# Patient Record
Sex: Male | Born: 1975 | Race: White | Hispanic: No | Marital: Married | Smoking: Former smoker
Health system: Southern US, Community
[De-identification: ages and names within clinical notes are randomized; demographics above are authoritative.]

## PROBLEM LIST (undated history)

## (undated) ENCOUNTER — Ambulatory Visit: Admission: EM | Source: Home / Self Care

## (undated) DIAGNOSIS — J45909 Unspecified asthma, uncomplicated: Secondary | ICD-10-CM

## (undated) DIAGNOSIS — K219 Gastro-esophageal reflux disease without esophagitis: Secondary | ICD-10-CM

## (undated) DIAGNOSIS — M1990 Unspecified osteoarthritis, unspecified site: Secondary | ICD-10-CM

## (undated) DIAGNOSIS — F32A Depression, unspecified: Secondary | ICD-10-CM

## (undated) DIAGNOSIS — T7840XA Allergy, unspecified, initial encounter: Secondary | ICD-10-CM

## (undated) DIAGNOSIS — F419 Anxiety disorder, unspecified: Secondary | ICD-10-CM

## (undated) HISTORY — DX: Gastro-esophageal reflux disease without esophagitis: K21.9

## (undated) HISTORY — DX: Anxiety disorder, unspecified: F41.9

## (undated) HISTORY — DX: Depression, unspecified: F32.A

## (undated) HISTORY — DX: Allergy, unspecified, initial encounter: T78.40XA

## (undated) HISTORY — DX: Unspecified asthma, uncomplicated: J45.909

## (undated) HISTORY — DX: Unspecified osteoarthritis, unspecified site: M19.90

---

## 2014-03-27 DIAGNOSIS — G4489 Other headache syndrome: Secondary | ICD-10-CM | POA: Insufficient documentation

## 2014-11-05 ENCOUNTER — Telehealth: Payer: Self-pay | Admitting: Family Medicine

## 2014-11-05 MED ORDER — FLUTICASONE-SALMETEROL 100-50 MCG/DOSE IN AEPB: 1.0000 | INHALATION_SPRAY | Freq: Two times a day (BID) | RESPIRATORY_TRACT | Status: DC

## 2014-11-05 NOTE — Telephone Encounter (Signed)
E-fax came through for refill on: Rx: Advair  Copy in basket.

## 2014-11-05 NOTE — Telephone Encounter (Signed)
Forwarded to provider.

## 2014-11-05 NOTE — Telephone Encounter (Signed)
Rx sent in

## 2014-11-16 ENCOUNTER — Ambulatory Visit (INDEPENDENT_AMBULATORY_CARE_PROVIDER_SITE_OTHER): Payer: 59 | Admitting: Family Medicine

## 2014-11-16 ENCOUNTER — Encounter: Payer: Self-pay | Admitting: Family Medicine

## 2014-11-16 VITALS — BP 124/84 | HR 58 | Temp 98.4°F | Ht 72.5 in | Wt 225.6 lb

## 2014-11-16 DIAGNOSIS — Z13220 Encounter for screening for lipoid disorders: Secondary | ICD-10-CM

## 2014-11-16 DIAGNOSIS — F419 Anxiety disorder, unspecified: Secondary | ICD-10-CM | POA: Insufficient documentation

## 2014-11-16 DIAGNOSIS — J4540 Moderate persistent asthma, uncomplicated: Secondary | ICD-10-CM

## 2014-11-16 DIAGNOSIS — Z0000 Encounter for general adult medical examination without abnormal findings: Secondary | ICD-10-CM | POA: Diagnosis not present

## 2014-11-16 DIAGNOSIS — M659 Synovitis and tenosynovitis, unspecified: Secondary | ICD-10-CM | POA: Diagnosis not present

## 2014-11-16 DIAGNOSIS — M778 Other enthesopathies, not elsewhere classified: Secondary | ICD-10-CM

## 2014-11-16 DIAGNOSIS — F411 Generalized anxiety disorder: Secondary | ICD-10-CM | POA: Diagnosis not present

## 2014-11-16 DIAGNOSIS — J45909 Unspecified asthma, uncomplicated: Secondary | ICD-10-CM | POA: Insufficient documentation

## 2014-11-16 LAB — CBC WITH DIFFERENTIAL/PLATELET
Hematocrit: 43.6 % (ref 37.5–51.0)
Hemoglobin: 14.6 g/dL (ref 12.6–17.7)
Lymphocytes Absolute: 2.3 10*3/uL (ref 0.7–3.1)
Lymphs: 36 %
MCH: 32.1 pg (ref 26.6–33.0)
MCHC: 33.5 g/dL (ref 31.5–35.7)
MCV: 96 fL (ref 79–97)
MID (Absolute): 0.8 10*3/uL (ref 0.1–1.6)
MID: 13 %
Neutrophils Absolute: 3.1 10*3/uL (ref 1.4–7.0)
Neutrophils: 51 %
Platelets: 223 10*3/uL (ref 150–379)
RBC: 4.55 x10E6/uL (ref 4.14–5.80)
RDW: 13.7 % (ref 12.3–15.4)
WBC: 6.2 10*3/uL (ref 3.4–10.8)

## 2014-11-16 LAB — MICROSCOPIC EXAMINATION
Epithelial Cells (non renal): NONE SEEN /hpf (ref 0–10)
RBC, UA: NONE SEEN /hpf (ref 0–?)

## 2014-11-16 MED ORDER — NORTRIPTYLINE HCL 10 MG PO CAPS: 10.0000 mg | ORAL_CAPSULE | Freq: Every day | ORAL | Status: DC

## 2014-11-16 MED ORDER — FLUOXETINE HCL 20 MG PO CAPS: 20.0000 mg | ORAL_CAPSULE | Freq: Every day | ORAL | Status: DC

## 2014-11-16 MED ORDER — BUSPIRONE HCL 5 MG PO TABS: 5.0000 mg | ORAL_TABLET | Freq: Every day | ORAL | Status: DC | PRN

## 2014-11-16 MED ORDER — MELOXICAM 15 MG PO TABS: 15.0000 mg | ORAL_TABLET | Freq: Every day | ORAL | Status: DC

## 2014-11-16 NOTE — Assessment & Plan Note (Signed)
Not under perfect control. May need to increase advair dose. Continue to monitor. Continue current regimen for this time. Follow up in 6 months if not sooner.

## 2014-11-16 NOTE — Progress Notes (Signed)
BP 124/84 mmHg  Pulse 58  Temp(Src) 98.4 F (36.9 C)  Ht 6' 0.5" (1.842 m)  Wt 225 lb 9.6 oz (102.331 kg)  BMI 30.16 kg/m2  SpO2 99%   Subjective:    Patient ID: Christopher Davenport, male    DOB: Nov 23, 1975, 39 y.o.   MRN: [id number]  HPI: Christopher Davenport is a 39 y.o. male presenting on 11/16/2014 for comprehensive medical examination. Current medical complaints include: Asthma and Anxiety  ASTHMA Asthma status: stable Satisfied with current treatment?: yes Albuterol/rescue inhaler frequency: with exercise Dyspnea frequency: with exercise Wheezing frequency: with exercise Cough frequency: with exercise Nocturnal symptom frequency: none Limitation of activity: no Current upper respiratory symptoms: no Last Spirometry: today Failed/intolerant to following asthma meds: qvar Aerochamber/spacer use: no Visits to ER or Urgent Care in past year: no Pneumovax: Not up to Date Influenza: Not up to Date  ANXIETY/STRESS Duration:stable Anxious mood: yes  Excessive worrying: no Irritability: no  Sweating: no Nausea: no Palpitations:no Hyperventilation: no Panic attacks: no Agoraphobia: no  Obscessions/compulsions: no Depressed mood: yes Depression screen PHQ 2/9 11/16/2014  Decreased Interest 0  Down, Depressed, Hopeless 0  PHQ - 2 Score 0  Altered sleeping 2  Tired, decreased energy 1  Change in appetite 1  Feeling bad or failure about yourself  0  Trouble concentrating 1  Moving slowly or fidgety/restless 0  Suicidal thoughts 0  PHQ-9 Score 5  Difficult doing work/chores Somewhat difficult   Anhedonia: no Weight changes: no Insomnia: yes   Hypersomnia: no Fatigue/loss of energy: no Feelings of worthlessness: no Feelings of guilt: no Impaired concentration/indecisiveness: no Suicidal ideations: no  Crying spells: no Recent Stressors/Life Changes: no  He currently lives with: wife- moving to new house Interim Problems from his last visit:  yes  Depression Screen done today and results listed below:  Depression screen [facility] 2/9 11/16/2014  Decreased Interest 0  Down, Depressed, Hopeless 0  PHQ - 2 Score 0  Altered sleeping 2  Tired, decreased energy 1  Change in appetite 1  Feeling bad or failure about yourself  0  Trouble concentrating 1  Moving slowly or fidgety/restless 0  Suicidal thoughts 0  PHQ-9 Score 5  Difficult doing work/chores Somewhat difficult    GAD7: 6  Past Medical History:  Past Medical History  Diagnosis Date  . Asthma   . Anxiety     Surgical History:  History reviewed. No pertinent past surgical history.  Medications:  Current Outpatient Prescriptions on File Prior to Visit  Medication Sig  . Fluticasone-Salmeterol (ADVAIR) 100-50 MCG/DOSE AEPB Inhale 1 puff into the lungs 2 (two) times daily.  Marland Kitchen albuterol (PROVENTIL HFA;VENTOLIN HFA) 108 (90 BASE) MCG/ACT inhaler Inhale into the lungs every 4 (four) hours as needed for wheezing or shortness of breath.   No current facility-administered medications on file prior to visit.    Allergies:  No Known Allergies  Social History:  Social History   Social History  . Marital Status: Married    Spouse Name: N/A  . Number of Children: N/A  . Years of Education: N/A   Occupational History  . Not on file.   Social History Main Topics  . Smoking status: Former Smoker    Quit date: 03/23/2012  . Smokeless tobacco: Never Used  . Alcohol Use: Yes     Comment: 2 during the week  and 3-4 on weekends  . Drug Use: No  . Sexual Activity: Yes   Other Topics Concern  .  Not on file   Social History Narrative   History  Smoking status  . Former Smoker  . Quit date: 03/23/2012  Smokeless tobacco  . Never Used   History  Alcohol Use  . Yes    Comment: 2 during the week  and 3-4 on weekends    Family History:  Family History  Problem Relation Age of Onset  . Diabetes Mother   . Kidney disease Mother   . Heart disease Mother   .  Hypertension Mother   . Hyperlipidemia Mother   . Dementia Maternal Grandmother   . Diabetes Maternal Grandfather   . Diabetes Paternal Grandmother     Past medical history, surgical history, medications, allergies, family history and social history reviewed with patient today and changes made to appropriate areas of the chart.   Review of Systems  Constitutional: Negative.   HENT: Negative.   Eyes: Negative.   Respiratory: Positive for shortness of breath and wheezing. Negative for cough, hemoptysis and sputum production.   Cardiovascular: Negative.   Gastrointestinal: Negative.   Genitourinary: Positive for frequency. Negative for dysuria, urgency, hematuria and flank pain.       No hesitancy, + incompletely emptying, no straining  Musculoskeletal: Negative for myalgias, back pain, joint pain, falls and neck pain.       Tendonitis in L elbow  Skin: Negative.   Neurological: Negative.   Endo/Heme/Allergies: Negative.   Psychiatric/Behavioral: Negative.     All other ROS negative except what is listed above and in the HPI.      Objective:    BP 124/84 mmHg  Pulse 58  Temp(Src) 98.4 F (36.9 C)  Ht 6' 0.5" (1.842 m)  Wt 225 lb 9.6 oz (102.331 kg)  BMI 30.16 kg/m2  SpO2 99%  Wt Readings from Last 3 Encounters:  11/16/14 225 lb 9.6 oz (102.331 kg)  06/12/14 233 lb (105.688 kg)    Physical Exam  Constitutional: He is oriented to person, place, and time. He appears well-developed and well-nourished. No distress.  HENT:  Head: Normocephalic and atraumatic.  Right Ear: Hearing and external ear normal.  Left Ear: Hearing and external ear normal.  Nose: Nose normal.  Mouth/Throat: Oropharynx is clear and moist. No oropharyngeal exudate.  Eyes: Conjunctivae, EOM and lids are normal. Pupils are equal, round, and reactive to light. Right eye exhibits no discharge. Left eye exhibits no discharge. No scleral icterus.  Neck: Normal range of motion. Neck supple. No JVD present. No  tracheal deviation present. No thyromegaly present.  Cardiovascular: Normal rate, regular rhythm, normal heart sounds and intact distal pulses.  Exam reveals no gallop and no friction rub.   No murmur heard. Pulmonary/Chest: Effort normal and breath sounds normal. No stridor. No respiratory distress. He has no wheezes. He has no rales. He exhibits no tenderness.  Abdominal: Soft. Bowel sounds are normal. He exhibits no distension and no mass. There is no tenderness. There is no rebound and no guarding. Hernia confirmed negative in the right inguinal area and confirmed negative in the left inguinal area.  Genitourinary: Testes normal and penis normal. Circumcised. No penile tenderness.  High riding R testicle  Musculoskeletal: Normal range of motion. He exhibits tenderness. He exhibits no edema.  L lateral and medial epicondyle  Lymphadenopathy:    He has no cervical adenopathy.       Right: No inguinal adenopathy present.       Left: No inguinal adenopathy present.  Neurological: He is alert and oriented to  person, place, and time. He has normal reflexes. He displays normal reflexes. No cranial nerve deficit. He exhibits normal muscle tone. Coordination normal.  Skin: Skin is warm, dry and intact. No rash noted. No erythema. No pallor.  Psychiatric: He has a normal mood and affect. His speech is normal and behavior is normal. Judgment and thought content normal. Cognition and memory are normal.  Nursing note and vitals reviewed.   No results found for this or any previous visit.    Assessment & Plan:   Problem List Items Addressed This Visit      Respiratory   Asthma    Not under perfect control. May need to increase advair dose. Continue to monitor. Continue current regimen for this time. Follow up in 6 months if not sooner.       Relevant Orders   Spirometry with graph (Completed)   CBC With Differential/Platelet     Other   Anxiety disorder    Under fair control on current  regimen. Continue current regimen. Refills given today. Follow up in 6 months. Continue to monitor.       Relevant Orders   Comprehensive metabolic panel   TSH    Other Visit Diagnoses    Routine general medical examination at a health care facility    -  Primary    Refused vaccines. Checking labs today. Continue to work on diet and exercise. Follow up for PE in 1 year.     Relevant Orders    Comprehensive metabolic panel    TSH    UA/M w/rflx Culture, Routine    CBC With Differential/Platelet    Screening cholesterol level        Checking today.    Relevant Orders    Lipid Panel w/o Chol/HDL Ratio    Tendonitis of elbow, left        Will treat with meloxicam. Rest and heat as able. Call if not getting better.         LABORATORY TESTING:  Health maintenance labs ordered today as discussed above.   IMMUNIZATIONS:   - Tdap: Tetanus vaccination status reviewed: Patient refused at this time. - Influenza: Postponed to flu season - Pneumovax: Refused  SCREENING: -Spirometry: Up to date   PATIENT COUNSELING:    Sexuality: Discussed sexually transmitted diseases, partner selection, use of condoms, avoidance of unintended pregnancy  and contraceptive alternatives.   Advised to avoid cigarette smoking.  I discussed with the patient that most people either abstain from alcohol or drink within safe limits (<=14/week and <=4 drinks/occasion for males, <=7/weeks and <= 3 drinks/occasion for females) and that the risk for alcohol disorders and other health effects rises proportionally with the number of drinks per week and how often a drinker exceeds daily limits.  Discussed cessation/primary prevention of drug use and availability of treatment for abuse.   Diet: Encouraged to adjust caloric intake to maintain  or achieve ideal body weight, to reduce intake of dietary saturated fat and total fat, to limit sodium intake by avoiding high sodium foods and not adding table salt, and to  maintain adequate dietary potassium and calcium preferably from fresh fruits, vegetables, and low-fat dairy products.    stressed the importance of regular exercise  Injury prevention: Discussed safety belts, safety helmets, smoke detector, smoking near bedding or upholstery.   Dental health: Discussed importance of regular tooth brushing, flossing, and dental visits.   Follow up plan: NEXT PREVENTATIVE PHYSICAL DUE IN 1 YEAR. Return in about 6  months (around 05/19/2015) for Follow up anxiety and asthma.

## 2014-11-16 NOTE — Patient Instructions (Signed)
Asthma Attack Prevention Although there is no way to prevent asthma from starting, you can take steps to control the disease and reduce its symptoms. Learn about your asthma and how to control it. Take an active role to control your asthma by working with your health care provider to create and follow an asthma action plan. An asthma action plan guides you in:  Taking your medicines properly.  Avoiding things that set off your asthma or make your asthma worse (asthma triggers).  Tracking your level of asthma control.  Responding to worsening asthma.  Seeking emergency care when needed. To track your asthma, keep records of your symptoms, check your peak flow number using a handheld device that shows how well air moves out of your lungs (peak flow meter), and get regular asthma checkups.  WHAT ARE SOME WAYS TO PREVENT AN ASTHMA ATTACK?  Take medicines as directed by your health care provider.  Keep track of your asthma symptoms and level of control.  With your health care provider, write a detailed plan for taking medicines and managing an asthma attack. Then be sure to follow your action plan. Asthma is an ongoing condition that needs regular monitoring and treatment.  Identify and avoid asthma triggers. Many outdoor allergens and irritants (such as pollen, mold, cold air, and air pollution) can trigger asthma attacks. Find out what your asthma triggers are and take steps to avoid them.  Monitor your breathing. Learn to recognize warning signs of an attack, such as coughing, wheezing, or shortness of breath. Your lung function may decrease before you notice any signs or symptoms, so regularly measure and record your peak airflow with a home peak flow meter.  Identify and treat attacks early. If you act quickly, you are less likely to have a severe attack. You will also need less medicine to control your symptoms. When your peak flow measurements decrease and alert you to an upcoming attack,  take your medicine as instructed and immediately stop any activity that may have triggered the attack. If your symptoms do not improve, get medical help.  Pay attention to increasing quick-relief inhaler use. If you find yourself relying on your quick-relief inhaler, your asthma is not under control. See your health care provider about adjusting your treatment. WHAT CAN MAKE MY SYMPTOMS WORSE? A number of common things can set off or make your asthma symptoms worse and cause temporary increased inflammation of your airways. Keep track of your asthma symptoms for several weeks, detailing all the environmental and emotional factors that are linked with your asthma. When you have an asthma attack, go back to your asthma diary to see which factor, or combination of factors, might have contributed to it. Once you know what these factors are, you can take steps to control many of them. If you have allergies and asthma, it is important to take asthma prevention steps at home. Minimizing contact with the substance to which you are allergic will help prevent an asthma attack. Some triggers and ways to avoid these triggers are: Animal Dander:  Some people are allergic to the flakes of skin or dried saliva from animals with fur or feathers.   There is no such thing as a hypoallergenic dog or cat breed. All dogs or cats can cause allergies, even if they don't shed.  Keep these pets out of your home.  If you are not able to keep a pet outdoors, keep the pet out of your bedroom and other sleeping areas at all   times, and keep the door closed.  Remove carpets and furniture covered with cloth from your home. If that is not possible, keep the pet away from fabric-covered furniture and carpets. Dust Mites: Many people with asthma are allergic to dust mites. Dust mites are tiny bugs that are found in every home in mattresses, pillows, carpets, fabric-covered furniture, bedcovers, clothes, stuffed toys, and other  fabric-covered items.   Cover your mattress in a special dust-proof cover.  Cover your pillow in a special dust-proof cover, or wash the pillow each week in hot water. Water must be hotter than 130 F (54.4 C) to kill dust mites. Cold or warm water used with detergent and bleach can also be effective.  Wash the sheets and blankets on your bed each week in hot water.  Try not to sleep or lie on cloth-covered cushions.  Call ahead when traveling and ask for a smoke-free hotel room. Bring your own bedding and pillows in case the hotel only supplies feather pillows and down comforters, which may contain dust mites and cause asthma symptoms.  Remove carpets from your bedroom and those laid on concrete, if you can.  Keep stuffed toys out of the bed, or wash the toys weekly in hot water or cooler water with detergent and bleach. Cockroaches: Many people with asthma are allergic to the droppings and remains of cockroaches.   Keep food and garbage in closed containers. Never leave food out.  Use poison baits, traps, powders, gels, or paste (for example, boric acid).  If a spray is used to kill cockroaches, stay out of the room until the odor goes away. Indoor Mold:  Fix leaky faucets, pipes, or other sources of water that have mold around them.  Clean floors and moldy surfaces with a fungicide or diluted bleach.  Avoid using humidifiers, vaporizers, or swamp coolers. These can spread molds through the air. Pollen and Outdoor Mold:  When pollen or mold spore counts are high, try to keep your windows closed.  Stay indoors with windows closed from late morning to afternoon. Pollen and some mold spore counts are highest at that time.  Ask your health care provider whether you need to take anti-inflammatory medicine or increase your dose of the medicine before your allergy season starts. Other Irritants to Avoid:  Tobacco smoke is an irritant. If you smoke, ask your health care provider how  you can quit. Ask family members to quit smoking, too. Do not allow smoking in your home or car.  If possible, do not use a wood-burning stove, kerosene heater, or fireplace. Minimize exposure to all sources of smoke, including incense, candles, fires, and fireworks.  Try to stay away from strong odors and sprays, such as perfume, talcum powder, hair spray, and paints.  Decrease humidity in your home and use an indoor air cleaning device. Reduce indoor humidity to below 60%. Dehumidifiers or central air conditioners can do this.  Decrease house dust exposure by changing furnace and air cooler filters frequently.  Try to have someone else vacuum for you once or twice a week. Stay out of rooms while they are being vacuumed and for a short while afterward.  If you vacuum, use a dust mask from a hardware store, a double-layered or microfilter vacuum cleaner bag, or a vacuum cleaner with a HEPA filter.  Sulfites in foods and beverages can be irritants. Do not drink beer or wine or eat dried fruit, processed potatoes, or shrimp if they cause asthma symptoms.  Cold   air can trigger an asthma attack. Cover your nose and mouth with a scarf on cold or windy days.  Several health conditions can make asthma more difficult to manage, including a runny nose, sinus infections, reflux disease, psychological stress, and sleep apnea. Work with your health care provider to manage these conditions.  Avoid close contact with people who have a respiratory infection such as a cold or the flu, since your asthma symptoms may get worse if you catch the infection. Wash your hands thoroughly after touching items that may have been handled by people with a respiratory infection.  Get a flu shot every year to protect against the flu virus, which often makes asthma worse for days or weeks. Also get a pneumonia shot if you have not previously had one. Unlike the flu shot, the pneumonia shot does not need to be given  yearly. Medicines:  Talk to your health care provider about whether it is safe for you to take aspirin or non-steroidal anti-inflammatory medicines (NSAIDs). In a small number of people with asthma, aspirin and NSAIDs can cause asthma attacks. These medicines must be avoided by people who have known aspirin-sensitive asthma. It is important that people with aspirin-sensitive asthma read labels of all over-the-counter medicines used to treat pain, colds, coughs, and fever.  Beta-blockers and ACE inhibitors are other medicines you should discuss with your health care provider. HOW CAN I FIND OUT WHAT I AM ALLERGIC TO? Ask your asthma health care provider about allergy skin testing or blood testing (the RAST test) to identify the allergens to which you are sensitive. If you are found to have allergies, the most important thing to do is to try to avoid exposure to any allergens that you are sensitive to as much as possible. Other treatments for allergies, such as medicines and allergy shots (immunotherapy) are available.  CAN I EXERCISE? Follow your health care provider's advice regarding asthma treatment before exercising. It is important to maintain a regular exercise program, but vigorous exercise or exercise in cold, humid, or dry environments can cause asthma attacks, especially for those people who have exercise-induced asthma. Document Released: 02/25/2009 Document Revised: 03/14/2013 Document Reviewed: 09/14/2012 [facility] Patient Information 2015 [place], [place]. This information is not intended to replace advice given to you by your health care provider. Make sure you discuss any questions you have with your health care provider. Health Maintenance A healthy lifestyle and preventative care can promote health and wellness.  Maintain regular health, dental, and eye exams.  Eat a healthy diet. Foods like vegetables, fruits, whole grains, low-fat dairy products, and lean protein foods contain the  nutrients you need and are low in calories. Decrease your intake of foods high in solid fats, added sugars, and salt. Get information about a proper diet from your health care provider, if necessary.  Regular physical exercise is one of the most important things you can do for your health. Most adults should get at least 150 minutes of moderate-intensity exercise (any activity that increases your heart rate and causes you to sweat) each week. In addition, most adults need muscle-strengthening exercises on 2 or more days a week.   Maintain a healthy weight. The body mass index (BMI) is a screening tool to identify possible weight problems. It provides an estimate of body fat based on height and weight. Your health care provider can find your BMI and can help you achieve or maintain a healthy weight. For males 20 years and older:  A BMI below  18.5 is considered underweight.  A BMI of 18.5 to 24.9 is normal.  A BMI of 25 to 29.9 is considered overweight.  A BMI of 30 and above is considered obese.  Maintain normal blood lipids and cholesterol by exercising and minimizing your intake of saturated fat. Eat a balanced diet with plenty of fruits and vegetables. Blood tests for lipids and cholesterol should begin at age 58 and be repeated every 5 years. If your lipid or cholesterol levels are high, you are over age 59, or you are at high risk for heart disease, you may need your cholesterol levels checked more frequently.Ongoing high lipid and cholesterol levels should be treated with medicines if diet and exercise are not working.  If you smoke, find out from your health care provider how to quit. If you do not use tobacco, do not start.  Lung cancer screening is recommended for adults aged 71-80 years who are at high risk for developing lung cancer because of a history of smoking. A yearly low-dose CT scan of the lungs is recommended for people who have at least a 30-pack-year history of smoking and  are current smokers or have quit within the past 15 years. A pack year of smoking is smoking an average of 1 pack of cigarettes a day for 1 year (for example, a 30-pack-year history of smoking could mean smoking 1 pack a day for 30 years or 2 packs a day for 15 years). Yearly screening should continue until the smoker has stopped smoking for at least 15 years. Yearly screening should be stopped for people who develop a health problem that would prevent them from having lung cancer treatment.  If you choose to drink alcohol, do not have more than 2 drinks per day. One drink is considered to be 12 oz (360 mL) of beer, 5 oz (150 mL) of wine, or 1.5 oz (45 mL) of liquor.  Avoid the use of street drugs. Do not share needles with anyone. Ask for help if you need support or instructions about stopping the use of drugs.  High blood pressure causes heart disease and increases the risk of stroke. Blood pressure should be checked at least every 1-2 years. Ongoing high blood pressure should be treated with medicines if weight loss and exercise are not effective.  If you are 18-67 years old, ask your health care provider if you should take aspirin to prevent heart disease.  Diabetes screening involves taking a blood sample to check your fasting blood sugar level. This should be done once every 3 years after age 65 if you are at a normal weight and without risk factors for diabetes. Testing should be considered at a younger age or be carried out more frequently if you are overweight and have at least 1 risk factor for diabetes.  Colorectal cancer can be detected and often prevented. Most routine colorectal cancer screening begins at the age of 6 and continues through age 40. However, your health care provider may recommend screening at an earlier age if you have risk factors for colon cancer. On a yearly basis, your health care provider may provide home test kits to check for hidden blood in the stool. A small camera  at the end of a tube may be used to directly examine the colon (sigmoidoscopy or colonoscopy) to detect the earliest forms of colorectal cancer. Talk to your health care provider about this at age 50 when routine screening begins. A direct exam of the colon should  be repeated every 5-10 years through age 57, unless early forms of precancerous polyps or small growths are found.  People who are at an increased risk for hepatitis B should be screened for this virus. You are considered at high risk for hepatitis B if:  You were born in a country where hepatitis B occurs often. Talk with your health care provider about which countries are considered high risk.  Your parents were born in a high-risk country and you have not received a shot to protect against hepatitis B (hepatitis B vaccine).  You have HIV or AIDS.  You use needles to inject street drugs.  You live with, or have sex with, someone who has hepatitis B.  You are a man who has sex with other men (MSM).  You get hemodialysis treatment.  You take certain medicines for conditions like cancer, organ transplantation, and autoimmune conditions.  Hepatitis C blood testing is recommended for all people born from [birth year] through 1967 and any individual with known risk factors for hepatitis C.  Healthy men should no longer receive prostate-specific antigen (PSA) blood tests as part of routine cancer screening. Talk to your health care provider about prostate cancer screening.  Testicular cancer screening is not recommended for adolescents or adult males who have no symptoms. Screening includes self-exam, a health care provider exam, and other screening tests. Consult with your health care provider about any symptoms you have or any concerns you have about testicular cancer.  Practice safe sex. Use condoms and avoid high-risk sexual practices to reduce the spread of sexually transmitted infections (STIs).  You should be screened for STIs,  including gonorrhea and chlamydia if:  You are sexually active and are younger than 24 years.  You are older than 24 years, and your health care provider tells you that you are at risk for this type of infection.  Your sexual activity has changed since you were last screened, and you are at an increased risk for chlamydia or gonorrhea. Ask your health care provider if you are at risk.  If you are at risk of being infected with HIV, it is recommended that you take a prescription medicine daily to prevent HIV infection. This is called pre-exposure prophylaxis (PrEP). You are considered at risk if:  You are a man who has sex with other men (MSM).  You are a heterosexual man who is sexually active with multiple partners.  You take drugs by injection.  You are sexually active with a partner who has HIV.  Talk with your health care provider about whether you are at high risk of being infected with HIV. If you choose to begin PrEP, you should first be tested for HIV. You should then be tested every 3 months for as long as you are taking PrEP.  Use sunscreen. Apply sunscreen liberally and repeatedly throughout the day. You should seek shade when your shadow is shorter than you. Protect yourself by wearing long sleeves, pants, a wide-brimmed hat, and sunglasses year round whenever you are outdoors.  Tell your health care provider of new moles or changes in moles, especially if there is a change in shape or color. Also, tell your health care provider if a mole is larger than the size of a pencil eraser.  A one-time screening for abdominal aortic aneurysm (AAA) and surgical repair of large AAAs by ultrasound is recommended for men aged 10-75 years who are current or former smokers.  Stay current with your vaccines (immunizations). Document Released:  09/05/2007 Document Revised: 03/14/2013 Document Reviewed: 08/04/2010 [employer] Patient Information 2015 [place], [place]. This information is not  intended to replace advice given to you by your health care provider. Make sure you discuss any questions you have with your health care provider.

## 2014-11-16 NOTE — Assessment & Plan Note (Signed)
Under fair control on current regimen. Continue current regimen. Refills given today. Follow up in 6 months. Continue to monitor.

## 2014-11-17 LAB — COMPREHENSIVE METABOLIC PANEL
ALT: 26 IU/L (ref 0–44)
AST: 25 IU/L (ref 0–40)
Albumin/Globulin Ratio: 1.8 (ref 1.1–2.5)
Albumin: 4.6 g/dL (ref 3.5–5.5)
Alkaline Phosphatase: 75 IU/L (ref 39–117)
BUN/Creatinine Ratio: 21 — ABNORMAL HIGH (ref 9–20)
BUN: 21 mg/dL (ref 6–24)
Bilirubin Total: 0.4 mg/dL (ref 0.0–1.2)
CO2: 23 mmol/L (ref 18–29)
Calcium: 9.5 mg/dL (ref 8.7–10.2)
Chloride: 101 mmol/L (ref 97–108)
Creatinine, Ser: 1 mg/dL (ref 0.76–1.27)
GFR calc Af Amer: 108 mL/min/{1.73_m2} (ref >59)
GFR calc non Af Amer: 93 mL/min/{1.73_m2} (ref >59)
Globulin, Total: 2.6 g/dL (ref 1.5–4.5)
Glucose: 97 mg/dL (ref 65–99)
Potassium: 4.4 mmol/L (ref 3.5–5.2)
Sodium: 141 mmol/L (ref 134–144)
Total Protein: 7.2 g/dL (ref 6.0–8.5)

## 2014-11-17 LAB — TSH: TSH: 2.08 u[IU]/mL (ref 0.450–4.500)

## 2014-11-17 LAB — LIPID PANEL W/O CHOL/HDL RATIO
Cholesterol, Total: 236 mg/dL — ABNORMAL HIGH (ref 100–199)
HDL: 61 mg/dL (ref >39)
LDL Calculated: 157 mg/dL — ABNORMAL HIGH (ref 0–99)
Triglycerides: 89 mg/dL (ref 0–149)
VLDL Cholesterol Cal: 18 mg/dL (ref 5–40)

## 2014-11-18 LAB — UA/M W/RFLX CULTURE, ROUTINE: Organism ID, Bacteria: NO GROWTH

## 2014-11-19 ENCOUNTER — Encounter: Payer: Self-pay | Admitting: Family Medicine

## 2014-12-24 ENCOUNTER — Other Ambulatory Visit: Payer: Self-pay | Admitting: Family Medicine

## 2015-04-30 ENCOUNTER — Other Ambulatory Visit: Payer: Self-pay | Admitting: Family Medicine

## 2015-05-14 ENCOUNTER — Other Ambulatory Visit: Payer: Self-pay | Admitting: Family Medicine

## 2015-05-17 ENCOUNTER — Ambulatory Visit (INDEPENDENT_AMBULATORY_CARE_PROVIDER_SITE_OTHER): Payer: 59 | Admitting: Family Medicine

## 2015-05-17 ENCOUNTER — Encounter: Payer: Self-pay | Admitting: Family Medicine

## 2015-05-17 VITALS — BP 126/76 | HR 69 | Temp 98.7°F | Ht 72.3 in | Wt 226.0 lb

## 2015-05-17 DIAGNOSIS — Z23 Encounter for immunization: Secondary | ICD-10-CM

## 2015-05-17 DIAGNOSIS — F411 Generalized anxiety disorder: Secondary | ICD-10-CM

## 2015-05-17 DIAGNOSIS — J4540 Moderate persistent asthma, uncomplicated: Secondary | ICD-10-CM | POA: Diagnosis not present

## 2015-05-17 MED ORDER — BUSPIRONE HCL 5 MG PO TABS: 5.0000 mg | ORAL_TABLET | Freq: Every day | ORAL | Status: DC | PRN

## 2015-05-17 MED ORDER — FLUTICASONE-SALMETEROL 100-50 MCG/DOSE IN AEPB: 1.0000 | INHALATION_SPRAY | Freq: Two times a day (BID) | RESPIRATORY_TRACT | Status: DC

## 2015-05-17 MED ORDER — FLUOXETINE HCL 20 MG PO CAPS: 20.0000 mg | ORAL_CAPSULE | Freq: Every day | ORAL | Status: DC

## 2015-05-17 MED ORDER — ALBUTEROL SULFATE HFA 108 (90 BASE) MCG/ACT IN AERS: INHALATION_SPRAY | RESPIRATORY_TRACT | Status: DC

## 2015-05-17 NOTE — Assessment & Plan Note (Signed)
Under fair control. Does not want to change medicine. Continue current regimen. Continue to monitor.

## 2015-05-17 NOTE — Assessment & Plan Note (Signed)
Under good control. Continue current regimen. Continue to monitor. Recheck 6 months. Pneumovax given today.

## 2015-05-17 NOTE — Patient Instructions (Signed)

## 2015-05-17 NOTE — Progress Notes (Signed)
BP 126/76 mmHg  Pulse 69  Temp(Src) 98.7 F (37.1 C)  Ht 6' 0.3" (1.836 m)  Wt 226 lb (102.513 kg)  BMI 30.41 kg/m2  SpO2 98%   Subjective:    Patient ID: Christopher Davenport, male    DOB: 20-Mar-1976, 40 y.o.   MRN: [id number]  HPI: Christopher Davenport is a 40 y.o. male  Chief Complaint  Patient presents with  . Asthma  . Anxiety   ANXIETY/STRESS Duration:exacerbated Anxious mood: yes  Excessive worrying: yes Irritability: yes  Sweating: no Nausea: no Palpitations:no Hyperventilation: yes Panic attacks: no Agoraphobia: no  Obscessions/compulsions: no Depressed mood: no Depression screen [facility] 2/9 05/17/2015 11/16/2014  Decreased Interest 0 0  Down, Depressed, Hopeless 1 0  PHQ - 2 Score 1 0  Altered sleeping - 2  Tired, decreased energy - 1  Change in appetite - 1  Feeling bad or failure about yourself  - 0  Trouble concentrating - 1  Moving slowly or fidgety/restless - 0  Suicidal thoughts - 0  PHQ-9 Score - 5  Difficult doing work/chores - Somewhat difficult   GAD 7 : Generalized Anxiety Score 05/17/2015  Nervous, Anxious, on Edge 3  Control/stop worrying 1  Worry too much - different things 3  Trouble relaxing 2  Restless 1  Easily annoyed or irritable 2  Afraid - awful might happen 0  Total GAD 7 Score 12  Anxiety Difficulty Somewhat difficult    Anhedonia: no Weight changes: no Insomnia: yes hard to fall asleep  Hypersomnia: no Fatigue/loss of energy: yes Feelings of worthlessness: no Feelings of guilt: no Impaired concentration/indecisiveness: no Suicidal ideations: no  Crying spells: no Recent Stressors/Life Changes: yes   Relationship problems: no   Family stress: no     Financial stress: no    Job stress: yes    Recent death/loss: no  ASTHMA Asthma status: stable Satisfied with current treatment?: yes Albuterol/rescue inhaler frequency: with working out Dyspnea frequency: with working out Wheezing frequency: rarely Cough  frequency: no Nocturnal symptom frequency: never  Limitation of activity: no Current upper respiratory symptoms: no Aerochamber/spacer use: no Visits to ER or Urgent Care in past year: no Pneumovax: Given today Influenza: Up to Date  Relevant past medical, surgical, family and social history reviewed and updated as indicated. Interim medical history since our last visit reviewed. Allergies and medications reviewed and updated.  Review of Systems  Constitutional: Negative.   Respiratory: Negative.   Cardiovascular: Negative.   Gastrointestinal: Negative.   Musculoskeletal: Negative.   Psychiatric/Behavioral: Negative.     Per HPI unless specifically indicated above     Objective:    BP 126/76 mmHg  Pulse 69  Temp(Src) 98.7 F (37.1 C)  Ht 6' 0.3" (1.836 m)  Wt 226 lb (102.513 kg)  BMI 30.41 kg/m2  SpO2 98%  Wt Readings from Last 3 Encounters:  05/17/15 226 lb (102.513 kg)  11/16/14 225 lb 9.6 oz (102.331 kg)  06/12/14 233 lb (105.688 kg)    Physical Exam  Constitutional: He is oriented to person, place, and time. He appears well-developed and well-nourished. No distress.  HENT:  Head: Normocephalic and atraumatic.  Right Ear: Hearing and external ear normal.  Left Ear: Hearing and external ear normal.  Nose: Nose normal.  Mouth/Throat: Oropharynx is clear and moist. No oropharyngeal exudate.  Eyes: Conjunctivae, EOM and lids are normal. Pupils are equal, round, and reactive to light. Right eye exhibits no discharge. Left eye exhibits no discharge. No scleral icterus.  Neck: Normal range of motion. Neck supple. No JVD present. No tracheal deviation present. No thyromegaly present.  Cardiovascular: Normal rate, regular rhythm, normal heart sounds and intact distal pulses.  Exam reveals no gallop and no friction rub.   No murmur heard. Pulmonary/Chest: Effort normal and breath sounds normal. No stridor. No respiratory distress. He has no wheezes. He has no rales. He  exhibits no tenderness.  Musculoskeletal: Normal range of motion.  Lymphadenopathy:    He has no cervical adenopathy.  Neurological: He is alert and oriented to person, place, and time.  Skin: Skin is warm, dry and intact. No rash noted. He is not diaphoretic. No erythema. No pallor.  Psychiatric: He has a normal mood and affect. His speech is normal and behavior is normal. Judgment and thought content normal. Cognition and memory are normal.  Nursing note and vitals reviewed.   Results for orders placed or performed in visit on 11/16/14  Microscopic Examination  Result Value Ref Range   WBC, UA 0-5 0 -  5 /hpf   RBC, UA None seen 0 -  2 /hpf   Epithelial Cells (non renal) None seen 0 - 10 /hpf   Bacteria, UA Few None seen/Few  Comprehensive metabolic panel  Result Value Ref Range   Glucose 97 65 - 99 mg/dL   BUN 21 6 - 24 mg/dL   Creatinine, Ser 1.00 0.76 - 1.27 mg/dL   GFR calc non Af Amer 93 >59 mL/min/1.73   GFR calc Af Amer 108 >59 mL/min/1.73   BUN/Creatinine Ratio 21 (H) 9 - 20   Sodium 141 134 - 144 mmol/L   Potassium 4.4 3.5 - 5.2 mmol/L   Chloride 101 97 - 108 mmol/L   CO2 23 18 - 29 mmol/L   Calcium 9.5 8.7 - 10.2 mg/dL   Total Protein 7.2 6.0 - 8.5 g/dL   Albumin 4.6 3.5 - 5.5 g/dL   Globulin, Total 2.6 1.5 - 4.5 g/dL   Albumin/Globulin Ratio 1.8 1.1 - 2.5   Bilirubin Total 0.4 0.0 - 1.2 mg/dL   Alkaline Phosphatase 75 39 - 117 IU/L   AST 25 0 - 40 IU/L   ALT 26 0 - 44 IU/L  TSH  Result Value Ref Range   TSH 2.080 0.450 - 4.500 uIU/mL  UA/M w/rflx Culture, Routine  Result Value Ref Range   Urine Culture, Routine Final report    Urine Culture result 1 No growth   Lipid Panel w/o Chol/HDL Ratio  Result Value Ref Range   Cholesterol, Total 236 (H) 100 - 199 mg/dL   Triglycerides 89 0 - 149 mg/dL   HDL 61 >39 mg/dL   VLDL Cholesterol Cal 18 5 - 40 mg/dL   LDL Calculated 157 (H) 0 - 99 mg/dL  CBC With Differential/Platelet  Result Value Ref Range   WBC 6.2  3.4 - 10.8 x10E3/uL   RBC 4.55 4.14 - 5.80 x10E6/uL   Hemoglobin 14.6 12.6 - 17.7 g/dL   Hematocrit 43.6 37.5 - 51.0 %   MCV 96 79 - 97 fL   MCH 32.1 26.6 - 33.0 pg   MCHC 33.5 31.5 - 35.7 g/dL   RDW 13.7 12.3 - 15.4 %   Platelets 223 150 - 379 x10E3/uL   Neutrophils 51 %   Lymphs 36 %   MID 13 %   Neutrophils Absolute 3.1 1.4 - 7.0 x10E3/uL   Lymphocytes Absolute 2.3 0.7 - 3.1 x10E3/uL   MID (Absolute) 0.8 0.1 - 1.6 X10E3/uL  Assessment & Plan:   Problem List Items Addressed This Visit      Respiratory   Asthma    Under good control. Continue current regimen. Continue to monitor. Recheck 6 months. Pneumovax given today.      Relevant Medications   Fluticasone-Salmeterol (ADVAIR DISKUS) 100-50 MCG/DOSE AEPB   albuterol (PROAIR HFA) 108 (90 Base) MCG/ACT inhaler     Other   Anxiety disorder - Primary    Under fair control. Does not want to change medicine. Continue current regimen. Continue to monitor.        Other Visit Diagnoses    Immunization due        Pneumovax given today.     Relevant Orders    Pneumococcal polysaccharide vaccine 23-valent greater than or equal to 2yo subcutaneous/IM        Follow up plan: Return in about 6 months (around 11/14/2015) for physical.

## 2015-07-22 ENCOUNTER — Other Ambulatory Visit: Payer: Self-pay | Admitting: Unknown Physician Specialty

## 2015-07-22 DIAGNOSIS — R1310 Dysphagia, unspecified: Secondary | ICD-10-CM

## 2015-07-22 DIAGNOSIS — K219 Gastro-esophageal reflux disease without esophagitis: Secondary | ICD-10-CM

## 2015-07-23 ENCOUNTER — Ambulatory Visit
Admission: EM | Admit: 2015-07-23 | Discharge: 2015-07-23 | Disposition: A | Discharge status: Home / Self Care | Payer: 59 | Attending: Family Medicine | Admitting: Family Medicine

## 2015-07-23 DIAGNOSIS — R42 Dizziness and giddiness: Secondary | ICD-10-CM | POA: Diagnosis present

## 2015-07-23 DIAGNOSIS — Z87891 Personal history of nicotine dependence: Secondary | ICD-10-CM | POA: Insufficient documentation

## 2015-07-23 DIAGNOSIS — F419 Anxiety disorder, unspecified: Secondary | ICD-10-CM | POA: Insufficient documentation

## 2015-07-23 DIAGNOSIS — R209 Unspecified disturbances of skin sensation: Secondary | ICD-10-CM | POA: Insufficient documentation

## 2015-07-23 DIAGNOSIS — J45909 Unspecified asthma, uncomplicated: Secondary | ICD-10-CM | POA: Diagnosis not present

## 2015-07-23 DIAGNOSIS — R202 Paresthesia of skin: Secondary | ICD-10-CM

## 2015-07-23 LAB — COMPREHENSIVE METABOLIC PANEL
ALT: 26 U/L (ref 17–63)
AST: 26 U/L (ref 15–41)
Albumin: 4.9 g/dL (ref 3.5–5.0)
Alkaline Phosphatase: 66 U/L (ref 38–126)
Anion gap: 7 (ref 5–15)
BUN: 16 mg/dL (ref 6–20)
CO2: 27 mmol/L (ref 22–32)
Calcium: 9.8 mg/dL (ref 8.9–10.3)
Chloride: 105 mmol/L (ref 101–111)
Creatinine, Ser: 1.12 mg/dL (ref 0.61–1.24)
GFR calc Af Amer: >60 mL/min (ref >60)
GFR calc non Af Amer: >60 mL/min (ref >60)
Glucose, Bld: 107 mg/dL — ABNORMAL HIGH (ref 65–99)
Potassium: 4 mmol/L (ref 3.5–5.1)
Sodium: 139 mmol/L (ref 135–145)
Total Bilirubin: 0.9 mg/dL (ref 0.3–1.2)
Total Protein: 8.2 g/dL — ABNORMAL HIGH (ref 6.5–8.1)

## 2015-07-23 LAB — CBC WITH DIFFERENTIAL/PLATELET
Basophils Absolute: 0 10*3/uL (ref 0–0.1)
Basophils Relative: 1 %
Eosinophils Absolute: 0.2 10*3/uL (ref 0–0.7)
Eosinophils Relative: 4 %
HCT: 44.7 % (ref 40.0–52.0)
Hemoglobin: 14.7 g/dL (ref 13.0–18.0)
Lymphocytes Relative: 19 %
Lymphs Abs: 1.2 10*3/uL (ref 1.0–3.6)
MCH: 32.3 pg (ref 26.0–34.0)
MCHC: 32.9 g/dL (ref 32.0–36.0)
MCV: 98.3 fL (ref 80.0–100.0)
Monocytes Absolute: 0.3 10*3/uL (ref 0.2–1.0)
Monocytes Relative: 4 %
Neutro Abs: 4.5 10*3/uL (ref 1.4–6.5)
Neutrophils Relative %: 72 %
Platelets: 221 10*3/uL (ref 150–440)
RBC: 4.55 MIL/uL (ref 4.40–5.90)
RDW: 14.4 % (ref 11.5–14.5)
WBC: 6.2 10*3/uL (ref 3.8–10.6)

## 2015-07-23 LAB — TSH: TSH: 3.865 u[IU]/mL (ref 0.350–4.500)

## 2015-07-23 LAB — URINALYSIS COMPLETE WITH MICROSCOPIC (ARMC ONLY)
Bacteria, UA: NONE SEEN
Bilirubin Urine: NEGATIVE
Glucose, UA: NEGATIVE mg/dL
Hgb urine dipstick: NEGATIVE
Ketones, ur: NEGATIVE mg/dL
Leukocytes, UA: NEGATIVE
Nitrite: NEGATIVE
Protein, ur: NEGATIVE mg/dL
RBC / HPF: NONE SEEN RBC/hpf (ref 0–5)
Specific Gravity, Urine: 1.01 (ref 1.005–1.030)
Squamous Epithelial / LPF: NONE SEEN
WBC, UA: NONE SEEN WBC/hpf (ref 0–5)
pH: 7 (ref 5.0–8.0)

## 2015-07-23 LAB — GLUCOSE, CAPILLARY: Glucose-Capillary: 96 mg/dL (ref 65–99)

## 2015-07-23 LAB — MAGNESIUM: Magnesium: 2.2 mg/dL (ref 1.7–2.4)

## 2015-07-23 NOTE — ED Provider Notes (Signed)
CSN: [id number]     Arrival date & time 07/23/15  [id number] History   First MD Initiated Contact with Patient 07/23/15 1039     Chief Complaint  Patient presents with  . Dizziness    Syncope x 3 weeks ago. Has been cutting calories and increased exercise. Has been getting lightheaded, hot flashes and having tingling in extremities. Feels fatigued.    (Consider location/radiation/quality/duration/timing/severity/associated sxs/prior Treatment) HPI Comments: 40 yo male with a c/o intermittent tingling of the extremities. States 3 weeks ago had an episode where he "passed out". States he's been cutting out calories and increasing exercise. Denies any one-sided weakness, vision changes.   The history is provided by the patient.    Past Medical History  Diagnosis Date  . Asthma   . Anxiety    History reviewed. No pertinent past surgical history. Family History  Problem Relation Age of Onset  . Diabetes Mother   . Kidney disease Mother   . Heart disease Mother   . Hypertension Mother   . Hyperlipidemia Mother   . Dementia Maternal Grandmother   . Diabetes Maternal Grandfather   . Diabetes Paternal Grandmother    Social History  Substance Use Topics  . Smoking status: Former Smoker    Quit date: 03/23/2012  . Smokeless tobacco: Never Used  . Alcohol Use: Yes     Comment: 2 during the week  and 3-4 on weekends    Review of Systems  Allergies  Review of patient's allergies indicates no known allergies.  Home Medications   Prior to Admission medications   Medication Sig Start Date End Date Taking? Authorizing Provider  b complex vitamins tablet Take 1 tablet by mouth daily.   Yes Historical Provider, MD  Multiple Vitamin (MULTIVITAMIN WITH MINERALS) TABS tablet Take 1 tablet by mouth daily.   Yes Historical Provider, MD  vitamin C (ASCORBIC ACID) 500 MG tablet Take 500 mg by mouth daily.   Yes Historical Provider, MD  albuterol (PROAIR HFA) 108 (90 Base) MCG/ACT inhaler inhale 2  puffs by mouth every 4 to 6 hours if needed for SHORTNESS OF BREATH 05/17/15   Megan P Johnson, DO  busPIRone (BUSPAR) 5 MG tablet Take 1 tablet (5 mg total) by mouth daily as needed. 05/17/15   Megan P Johnson, DO  FLUoxetine (PROZAC) 20 MG capsule Take 1 capsule (20 mg total) by mouth daily. 05/17/15   Megan P Johnson, DO  Fluticasone-Salmeterol (ADVAIR DISKUS) 100-50 MCG/DOSE AEPB Inhale 1 puff into the lungs 2 (two) times daily. 05/17/15   Megan P Johnson, DO  meloxicam (MOBIC) 15 MG tablet Take 1 tablet (15 mg total) by mouth daily. 11/16/14   Megan P Johnson, DO  nortriptyline (PAMELOR) 10 MG capsule take 1 capsule by mouth at bedtime 05/14/15   Valerie Roys, DO   Meds Ordered and Administered this Visit  Medications - No data to display  BP 122/83 mmHg  Pulse 79  Temp(Src) 98.2 F (36.8 C) (Oral)  Resp 18  Ht 6\' 2"  (1.88 m)  Wt 222 lb (100.699 kg)  BMI 28.49 kg/m2  SpO2 100% No data found.   Physical Exam  Constitutional: He is oriented to person, place, and time. He appears well-developed and well-nourished. No distress.  HENT:  Head: Normocephalic and atraumatic.  Right Ear: Tympanic membrane, external ear and ear canal normal.  Left Ear: Tympanic membrane, external ear and ear canal normal.  Nose: Nose normal.  Mouth/Throat: Uvula is midline, oropharynx is clear and  moist and mucous membranes are normal. No oropharyngeal exudate or tonsillar abscesses.  Eyes: Conjunctivae and EOM are normal. Pupils are equal, round, and reactive to light. Right eye exhibits no discharge. Left eye exhibits no discharge. No scleral icterus.  Neck: Normal range of motion. Neck supple. No tracheal deviation present. No thyromegaly present.  Cardiovascular: Normal rate, regular rhythm and normal heart sounds.   Pulmonary/Chest: Effort normal and breath sounds normal. No stridor. No respiratory distress. He has no wheezes. He has no rales. He exhibits no tenderness.  Abdominal: Soft. Bowel sounds  are normal. He exhibits no distension and no mass. There is no tenderness. There is no rebound and no guarding.  Musculoskeletal: He exhibits no edema.  Lymphadenopathy:    He has no cervical adenopathy.  Neurological: He is alert and oriented to person, place, and time. He has normal reflexes. He displays normal reflexes. No cranial nerve deficit. He exhibits normal muscle tone. Coordination normal.  Skin: Skin is warm and dry. No rash noted. He is not diaphoretic.  Psychiatric: He has a normal mood and affect.  Nursing note and vitals reviewed.   ED Course  Procedures (including critical care time)  Labs Review Labs Reviewed  COMPREHENSIVE METABOLIC PANEL - Abnormal; Notable for the following:    Glucose, Bld 107 (*)    Total Protein 8.2 (*)    All other components within normal limits  GLUCOSE, CAPILLARY  CBC WITH DIFFERENTIAL/PLATELET  URINALYSIS COMPLETEWITH MICROSCOPIC ([facility] ONLY)  TSH  MAGNESIUM  CBG MONITORING, ED    Imaging Review No results found.   Visual Acuity Review  Right Eye Distance:   Left Eye Distance:   Bilateral Distance:    Right Eye Near:   Left Eye Near:    Bilateral Near:       EKG: normal EKG, normal sinus rhythm, there are no previous tracings available for comparison; reviewed by me and agree.    MDM   1. Paresthesias    Discharge Medication List as of 07/23/2015 12:22 PM     1. Labs/EKG results and diagnosis reviewed with patient/parent/guardian/family 2. rx as per orders above; reviewed possible side effects, interactions, risks and benefits  3. Follow-up with PCP or prn if symptoms worsen or don't improve    Norval Gable, MD 07/29/15 2309

## 2015-07-24 ENCOUNTER — Telehealth: Payer: Self-pay | Admitting: Emergency Medicine

## 2015-07-24 NOTE — ED Notes (Signed)
Patient called requesting the results of his lab test.  Patient was notified of his TSH and Magnesium results which both were within normal limits.  Patient verbalized understanding.

## 2015-07-26 ENCOUNTER — Ambulatory Visit: Payer: Self-pay

## 2015-07-30 ENCOUNTER — Encounter: Payer: Self-pay | Admitting: Family Medicine

## 2015-07-30 ENCOUNTER — Telehealth: Payer: Self-pay | Admitting: Family Medicine

## 2015-07-30 ENCOUNTER — Ambulatory Visit (INDEPENDENT_AMBULATORY_CARE_PROVIDER_SITE_OTHER): Payer: 59 | Admitting: Family Medicine

## 2015-07-30 VITALS — BP 110/79 | HR 82 | Temp 98.6°F | Ht 71.2 in | Wt 219.0 lb

## 2015-07-30 DIAGNOSIS — R55 Syncope and collapse: Secondary | ICD-10-CM | POA: Diagnosis not present

## 2015-07-30 DIAGNOSIS — S0990XA Unspecified injury of head, initial encounter: Secondary | ICD-10-CM | POA: Diagnosis not present

## 2015-07-30 DIAGNOSIS — R42 Dizziness and giddiness: Secondary | ICD-10-CM | POA: Diagnosis not present

## 2015-07-30 MED ORDER — MECLIZINE HCL 25 MG PO TABS: 25.0000 mg | ORAL_TABLET | Freq: Three times a day (TID) | ORAL | Status: DC | PRN

## 2015-07-30 NOTE — Patient Instructions (Signed)
Benign Positional Vertigo Vertigo is the feeling that you or your surroundings are moving when they are not. Benign positional vertigo is the most common form of vertigo. The cause of this condition is not serious (is benign). This condition is triggered by certain movements and positions (is positional). This condition can be dangerous if it occurs while you are doing something that could endanger you or others, such as driving.  CAUSES In many cases, the cause of this condition is not known. It may be caused by a disturbance in an area of the inner ear that helps your brain to sense movement and balance. This disturbance can be caused by a viral infection (labyrinthitis), head injury, or repetitive motion. RISK FACTORS This condition is more likely to develop in:  Women.  People who are 50 years of age or older. SYMPTOMS Symptoms of this condition usually happen when you move your head or your eyes in different directions. Symptoms may start suddenly, and they usually last for less than a minute. Symptoms may include:  Loss of balance and falling.  Feeling like you are spinning or moving.  Feeling like your surroundings are spinning or moving.  Nausea and vomiting.  Blurred vision.  Dizziness.  Involuntary eye movement (nystagmus). Symptoms can be mild and cause only slight annoyance, or they can be severe and interfere with daily life. Episodes of benign positional vertigo may return (recur) over time, and they may be triggered by certain movements. Symptoms may improve over time. DIAGNOSIS This condition is usually diagnosed by medical history and a physical exam of the head, neck, and ears. You may be referred to a health care provider who specializes in ear, nose, and throat (ENT) problems (otolaryngologist) or a provider who specializes in disorders of the nervous system (neurologist). You may have additional testing, including:  MRI.  A CT scan.  Eye movement tests. Your  health care provider may ask you to change positions quickly while he or she watches you for symptoms of benign positional vertigo, such as nystagmus. Eye movement may be tested with an electronystagmogram (ENG), caloric stimulation, the Dix-Hallpike test, or the roll test.  An electroencephalogram (EEG). This records electrical activity in your brain.  Hearing tests. TREATMENT Usually, your health care provider will treat this by moving your head in specific positions to adjust your inner ear back to normal. Surgery may be needed in severe cases, but this is rare. In some cases, benign positional vertigo may resolve on its own in 2-4 weeks. HOME CARE INSTRUCTIONS Safety  Move slowly.Avoid sudden body or head movements.  Avoid driving.  Avoid operating heavy machinery.  Avoid doing any tasks that would be dangerous to you or others if a vertigo episode would occur.  If you have trouble walking or keeping your balance, try using a cane for stability. If you feel dizzy or unstable, sit down right away.  Return to your normal activities as told by your health care provider. Ask your health care provider what activities are safe for you. General Instructions  Take over-the-counter and prescription medicines only as told by your health care provider.  Avoid certain positions or movements as told by your health care provider.  Drink enough fluid to keep your urine clear or pale yellow.  Keep all follow-up visits as told by your health care provider. This is important. SEEK MEDICAL CARE IF:  You have a fever.  Your condition gets worse or you develop new symptoms.  Your family or friends   notice any behavioral changes.  Your nausea or vomiting gets worse.  You have numbness or a "pins and needles" sensation. SEEK IMMEDIATE MEDICAL CARE IF:  You have difficulty speaking or moving.  You are always dizzy.  You faint.  You develop severe headaches.  You have weakness in your  legs or arms.  You have changes in your hearing or vision.  You develop a stiff neck.  You develop sensitivity to light.   This information is not intended to replace advice given to you by your health care provider. Make sure you discuss any questions you have with your health care provider.   Document Released: 12/15/2005 Document Revised: 11/28/2014 Document Reviewed: 07/02/2014 [employer] Interactive Patient Education 2016 [employer]  

## 2015-07-30 NOTE — Progress Notes (Signed)
BP 110/79 mmHg  Pulse 82  Temp(Src) 98.6 F (37 C)  Ht 5' 11.2" (1.808 m)  Wt 219 lb (99.338 kg)  BMI 30.39 kg/m2  SpO2 100%   Subjective:    Patient ID: Christopher Davenport, male    DOB: 03/27/1975, 40 y.o.   MRN: [id number]  HPI: Christopher Davenport is a 40 y.o. male  Chief Complaint  Patient presents with  . UC Follow Up   ER FOLLOW UP- syncope 3 weeks ago, had been cutting calories and increasing exercise. Got light headed, hot flashes and tingling in extremities, feels very tired, had been having some vertigo Time since discharge: 7 days Hospital/facility: Urgent Care Diagnosis: Paresthesias Procedures/tests: Labs, EKG Consultants: None New medications: None Discharge instructions: Follow up here  Status: stable   Has cut out gluten and alcohol- feeling a little better since doing that.  DIZZINESS Duration: about a month, has been under a huge amount of stress at work, got really bad a week ago Description of symptoms: room spinning Duration of episode: hours Dizziness frequency: recurrent Provoking factors: computer at work Aggravating factors:  Moving his head Triggered by rolling over in bed: no Triggered by bending over: yes Aggravated by head movement: yes Aggravated by exertion, coughing, loud noises: no Recent head injury: yes- hit his head when he passed out Recent or current viral symptoms: no History of vasovagal episodes: yes Nausea: yes Vomiting: no Tinnitus: yes Hearing loss: no Aural fullness: yes Headache: no Photophobia/phonophobia: yes Unsteady gait: yes Postural instability: no Diplopia, dysarthria, dysphagia or weakness: no Related to exertion: no Pallor: no Diaphoresis: yes Dyspnea: yes Chest pain: no  Relevant past medical, surgical, family and social history reviewed and updated as indicated. Interim medical history since our last visit reviewed. Allergies and medications reviewed and updated.  Review of Systems   Constitutional: Positive for diaphoresis and fatigue. Negative for fever, chills, activity change, appetite change and unexpected weight change.  Respiratory: Positive for shortness of breath. Negative for apnea, cough, choking, chest tightness, wheezing and stridor.   Cardiovascular: Negative.   Neurological: Positive for dizziness, light-headedness, numbness and headaches. Negative for tremors, seizures, syncope, facial asymmetry, speech difficulty and weakness.  Psychiatric/Behavioral: Negative for suicidal ideas, hallucinations, behavioral problems, confusion, sleep disturbance, self-injury, dysphoric mood, decreased concentration and agitation. The patient is nervous/anxious. The patient is not hyperactive.     Per HPI unless specifically indicated above     Objective:    BP 110/79 mmHg  Pulse 82  Temp(Src) 98.6 F (37 C)  Ht 5' 11.2" (1.808 m)  Wt 219 lb (99.338 kg)  BMI 30.39 kg/m2  SpO2 100%  Wt Readings from Last 3 Encounters:  07/30/15 219 lb (99.338 kg)  07/23/15 222 lb (100.699 kg)  05/17/15 226 lb (102.513 kg)    Orthostatic VS for the past 24 hrs:  BP- Lying Pulse- Lying BP- Sitting Pulse- Sitting BP- Standing at 0 minutes Pulse- Standing at 0 minutes  07/30/15 0916 116/77 mmHg 70 116/80 mmHg 73 114/79 mmHg 84   Physical Exam  Constitutional: He is oriented to person, place, and time. He appears well-developed and well-nourished. No distress.  HENT:  Head: Normocephalic and atraumatic.  Right Ear: Hearing and external ear normal.  Left Ear: Hearing and external ear normal.  Nose: Nose normal.  Mouth/Throat: Oropharynx is clear and moist. No oropharyngeal exudate.  Eyes: Conjunctivae and lids are normal. Pupils are equal, round, and reactive to light. Right eye exhibits no discharge. Left eye exhibits  no discharge. No scleral icterus. Right eye exhibits nystagmus. Left eye exhibits nystagmus. Left eye exhibits normal extraocular motion.  Neck: Normal range of  motion. Neck supple. No JVD present. No tracheal deviation present. No thyromegaly present.  Cardiovascular: Normal rate, regular rhythm, normal heart sounds and intact distal pulses.  Exam reveals no gallop and no friction rub.   No murmur heard. Pulmonary/Chest: Effort normal and breath sounds normal. No stridor. No respiratory distress. He has no wheezes. He has no rales. He exhibits no tenderness.  Musculoskeletal: Normal range of motion.  Lymphadenopathy:    He has no cervical adenopathy.  Neurological: He is alert and oriented to person, place, and time. He has normal reflexes. He displays normal reflexes. No cranial nerve deficit. He exhibits normal muscle tone. Coordination normal.  Skin: Skin is warm, dry and intact. No rash noted. He is not diaphoretic. No erythema. No pallor.  Psychiatric: He has a normal mood and affect. His speech is normal and behavior is normal. Judgment and thought content normal. Cognition and memory are normal.  Nursing note and vitals reviewed.   Results for orders placed or performed during the hospital encounter of 07/23/15  Glucose, capillary  Result Value Ref Range   Glucose-Capillary 96 65 - 99 mg/dL   Comment 1 Document in Chart   CBC with Differential  Result Value Ref Range   WBC 6.2 3.8 - 10.6 K/uL   RBC 4.55 4.40 - 5.90 MIL/uL   Hemoglobin 14.7 13.0 - 18.0 g/dL   HCT 44.7 40.0 - 52.0 %   MCV 98.3 80.0 - 100.0 fL   MCH 32.3 26.0 - 34.0 pg   MCHC 32.9 32.0 - 36.0 g/dL   RDW 14.4 11.5 - 14.5 %   Platelets 221 150 - 440 K/uL   Neutrophils Relative % 72 %   Neutro Abs 4.5 1.4 - 6.5 K/uL   Lymphocytes Relative 19 %   Lymphs Abs 1.2 1.0 - 3.6 K/uL   Monocytes Relative 4 %   Monocytes Absolute 0.3 0.2 - 1.0 K/uL   Eosinophils Relative 4 %   Eosinophils Absolute 0.2 0 - 0.7 K/uL   Basophils Relative 1 %   Basophils Absolute 0.0 0 - 0.1 K/uL  Comprehensive metabolic panel  Result Value Ref Range   Sodium 139 135 - 145 mmol/L   Potassium  4.0 3.5 - 5.1 mmol/L   Chloride 105 101 - 111 mmol/L   CO2 27 22 - 32 mmol/L   Glucose, Bld 107 (H) 65 - 99 mg/dL   BUN 16 6 - 20 mg/dL   Creatinine, Ser 1.12 0.61 - 1.24 mg/dL   Calcium 9.8 8.9 - 10.3 mg/dL   Total Protein 8.2 (H) 6.5 - 8.1 g/dL   Albumin 4.9 3.5 - 5.0 g/dL   AST 26 15 - 41 U/L   ALT 26 17 - 63 U/L   Alkaline Phosphatase 66 38 - 126 U/L   Total Bilirubin 0.9 0.3 - 1.2 mg/dL   GFR calc non Af Amer >60 >60 mL/min   GFR calc Af Amer >60 >60 mL/min   Anion gap 7 5 - 15  Urinalysis complete, with microscopic  Result Value Ref Range   Color, Urine YELLOW YELLOW   APPearance CLEAR CLEAR   Glucose, UA NEGATIVE NEGATIVE mg/dL   Bilirubin Urine NEGATIVE NEGATIVE   Ketones, ur NEGATIVE NEGATIVE mg/dL   Specific Gravity, Urine 1.010 1.005 - 1.030   Hgb urine dipstick NEGATIVE NEGATIVE   pH 7.0  5.0 - 8.0   Protein, ur NEGATIVE NEGATIVE mg/dL   Nitrite NEGATIVE NEGATIVE   Leukocytes, UA NEGATIVE NEGATIVE   RBC / HPF NONE SEEN 0 - 5 RBC/hpf   WBC, UA NONE SEEN 0 - 5 WBC/hpf   Bacteria, UA NONE SEEN NONE SEEN   Squamous Epithelial / LPF NONE SEEN NONE SEEN  TSH  Result Value Ref Range   TSH 3.865 0.350 - 4.500 uIU/mL  Magnesium  Result Value Ref Range   Magnesium 2.2 1.7 - 2.4 mg/dL      Assessment & Plan:   Problem List Items Addressed This Visit    None    Visit Diagnoses    Syncope, unspecified syncope type    -  Primary    Likely due to dramatic dietary changes. Labs normal. Checking T3 and T4 and patient request. Call if returns. HYDRATE.     Relevant Orders    Thyroid Panel With TSH    Basic metabolic panel    CT Head W Wo Contrast    Head injury, initial encounter        Will check CT scan with and without given fall and now vertigo. Scheduled for Thursday. Warning signs for which to go to ER discussed.     Relevant Orders    Thyroid Panel With TSH    Basic metabolic panel    CT Head W Wo Contrast    Vertigo        Likely BPPV. [employer] and  [employer]. CT ordered. Call if getting worse or not better. If not better in 1-2 weeks, will send to PT.    Relevant Orders    Thyroid Panel With TSH    Basic metabolic panel    CT Head W Wo Contrast        Follow up plan: Return By Phone Thursday, 2 weeks if CT normal.

## 2015-07-30 NOTE — Telephone Encounter (Signed)
Called to do Peer to Peer on CT head with and without. Waited on hold for 7 minutes and was disconnected.   Called again to do Peer to peer. Spoke with Jeannene Patella. Transferred to [place].  Spoke to [employer] for peer to peer.   Transferred to specialist- it is awaiting review. No information needed. No peer to peer needed to be done.

## 2015-07-31 ENCOUNTER — Encounter: Payer: Self-pay | Admitting: Family Medicine

## 2015-07-31 LAB — BASIC METABOLIC PANEL
BUN/Creatinine Ratio: 13 (ref 9–20)
BUN: 16 mg/dL (ref 6–24)
CO2: 25 mmol/L (ref 18–29)
Calcium: 9.6 mg/dL (ref 8.7–10.2)
Chloride: 99 mmol/L (ref 96–106)
Creatinine, Ser: 1.21 mg/dL (ref 0.76–1.27)
GFR calc Af Amer: 85 mL/min/{1.73_m2} (ref >59)
GFR calc non Af Amer: 73 mL/min/{1.73_m2} (ref >59)
Glucose: 86 mg/dL (ref 65–99)
Potassium: 4.3 mmol/L (ref 3.5–5.2)
Sodium: 140 mmol/L (ref 134–144)

## 2015-07-31 LAB — THYROID PANEL WITH TSH
Free Thyroxine Index: 2.3 (ref 1.2–4.9)
T3 Uptake Ratio: 32 % (ref 24–39)
T4, Total: 7.2 ug/dL (ref 4.5–12.0)
TSH: 2.48 u[IU]/mL (ref 0.450–4.500)

## 2015-08-01 ENCOUNTER — Ambulatory Visit: Admission: RE | Admit: 2015-08-01 | Payer: 59 | Source: Ambulatory Visit

## 2015-08-01 ENCOUNTER — Telehealth: Payer: Self-pay | Admitting: Family Medicine

## 2015-08-01 DIAGNOSIS — S0990XA Unspecified injury of head, initial encounter: Secondary | ICD-10-CM

## 2015-08-01 DIAGNOSIS — R42 Dizziness and giddiness: Secondary | ICD-10-CM

## 2015-08-01 DIAGNOSIS — R55 Syncope and collapse: Secondary | ICD-10-CM

## 2015-08-01 NOTE — Telephone Encounter (Signed)
Yes, patient notified.

## 2015-08-01 NOTE — Telephone Encounter (Signed)
Did you notify patient, if not please call and let him know.

## 2015-08-01 NOTE — Telephone Encounter (Signed)
Tried calling patient to see if he needed any type of medication for nerves to have an MRI.  He did not answer. He explained that he would be in meetings all day today. He stepped out to return my call about the CT scan. Will call patient again.

## 2015-08-01 NOTE — Telephone Encounter (Signed)
I submitted the prior authorization through [facility] online and they system needed peer to peer review.  I gave Dr. Wynetta Emery the number for her to call. (see telephone encounter 07/30/2015) They told Dr. Wynetta Emery a Peer-to-Peer was not needed and that the PA was under review.   Radiology called today and explained on [facility] online it still states needs Peer-to-Peer review. Patient's appointment was at 3:00 today.   I called [employer] and provided more clinical information to the staff to see if the process would go through. They then explained to me again, the CT scan would need a Peer-to-Peer review from a PA or MD.  I was on the phone with [facility] for almost 45 minutes Appointment canceled for patient for CT not being approved yet.  Dr. Wynetta Emery notified.   The number to Peer-to-Peer is [phone] option 3.  Available from 7:00am-7:00pm for Peer-to-Peer discussions.

## 2015-08-01 NOTE — Telephone Encounter (Signed)
Pt called stated he was scheduled for a CT today, they called and cancelled the CT stating his insurance had not approved it yet. Pt would like to let you know about this and also would like to know what the next steps is or should be. Please call pt to follow, pt may be away from his desk feel free to leave a message. Thanks.

## 2015-08-01 NOTE — Telephone Encounter (Signed)
Called for peer-peer spoke to Dr. Marcy Panning.  OK to get MRI of the brain with and without. Approved. [id number] Good until June 25

## 2015-08-02 NOTE — Telephone Encounter (Signed)
Patient's appointment is scheduled for 08/22/2015 at [facility]. This was the first available appointment at all facilities.   Appointment is at 9:30, arrive 9:15.   Will call patient again if he doesn't return my call and ask if he needs sedation and notify him of appointment. Dr. Wynetta Emery will not return till Wednesday 08/07/2015 to write the Rx if needed. I will notify patient of that as well.

## 2015-08-05 NOTE — Telephone Encounter (Signed)
Patient notified.   Patient does not need sedation.   When I him the appointment was the first available. If Dr. Wynetta Emery wanted it sooner, it would have to be marked as urgent for me to reschedule. Explained to patient Dr. Wynetta Emery would not be back until Wednesday.  Patient stated he feels better, and was wondering if was an allergic reaction. Would like to know if there's a way he could be tested for mold exposure or allergy. He states he's not 100% but he is better. I told patient someone would call him back as soon as we heard from Dr. Wynetta Emery.

## 2015-08-07 NOTE — Telephone Encounter (Signed)
There is no way to test for mold exposure. He can go to an allergist for allergy testing and I can arrange that, but I do feel like he needs to have the MRI based on the symptoms he was having and his fall. Thanks.

## 2015-08-12 ENCOUNTER — Telehealth: Payer: Self-pay

## 2015-08-12 DIAGNOSIS — W57XXXA Bitten or stung by nonvenomous insect and other nonvenomous arthropods, initial encounter: Secondary | ICD-10-CM

## 2015-08-12 NOTE — Telephone Encounter (Signed)
Patient called wondering if he could get some labs done such as lyme disease and such because he works outside. Told patient I'd send a not to Dr. Wynetta Emery and get back to him on what she says.

## 2015-08-12 NOTE — Telephone Encounter (Signed)
Sure. He can come in to get them done whenever he'd like.

## 2015-08-12 NOTE — Telephone Encounter (Signed)
Left message on patient's personal voicemail. Explained he can come get the labs anytime, no appointment needed. Explained to call back with any questions.

## 2015-08-13 ENCOUNTER — Other Ambulatory Visit: Payer: 59

## 2015-08-13 DIAGNOSIS — W57XXXA Bitten or stung by nonvenomous insect and other nonvenomous arthropods, initial encounter: Secondary | ICD-10-CM

## 2015-08-13 DIAGNOSIS — R109 Unspecified abdominal pain: Secondary | ICD-10-CM

## 2015-08-15 ENCOUNTER — Encounter: Payer: Self-pay | Admitting: Family Medicine

## 2015-08-15 LAB — EHRLICHIA ANTIBODY PANEL
E. Chaffeensis (HME) IgM Titer: NEGATIVE
E.Chaffeensis (HME) IgG: NEGATIVE
HGE IgG Titer: NEGATIVE
HGE IgM Titer: NEGATIVE

## 2015-08-15 LAB — ROCKY MTN SPOTTED FVR ABS PNL(IGG+IGM)
RMSF IgG: NEGATIVE
RMSF IgM: 0.32 index (ref 0.00–0.89)

## 2015-08-15 LAB — LYME AB/WESTERN BLOT REFLEX
LYME DISEASE AB, QUANT, IGM: <0.8 index (ref 0.00–0.79)
Lyme IgG/IgM Ab: <0.91 {ISR} (ref 0.00–0.90)

## 2015-08-15 LAB — BABESIA MICROTI ANTIBODY PANEL
Babesia microti IgG: <1:10 {titer}
Babesia microti IgM: <1:10 {titer}

## 2015-08-15 LAB — HELICOBACTER PYLORI ABS-IGG+IGA, BLD
H Pylori IgG: <0.9 U/mL (ref 0.0–0.8)
H. pylori, IgA Abs: <9 units (ref 0.0–8.9)

## 2015-08-22 ENCOUNTER — Ambulatory Visit
Admission: RE | Admit: 2015-08-22 | Discharge: 2015-08-22 | Disposition: A | Discharge status: Home / Self Care | Payer: 59 | Source: Ambulatory Visit | Attending: Family Medicine | Admitting: Family Medicine

## 2015-08-22 DIAGNOSIS — X58XXXA Exposure to other specified factors, initial encounter: Secondary | ICD-10-CM | POA: Insufficient documentation

## 2015-08-22 DIAGNOSIS — R55 Syncope and collapse: Secondary | ICD-10-CM

## 2015-08-22 DIAGNOSIS — S0990XA Unspecified injury of head, initial encounter: Secondary | ICD-10-CM

## 2015-08-22 DIAGNOSIS — R42 Dizziness and giddiness: Secondary | ICD-10-CM | POA: Diagnosis present

## 2015-08-22 MED ORDER — GADOBENATE DIMEGLUMINE 529 MG/ML IV SOLN
20.0000 mL | Freq: Once | INTRAVENOUS | Status: AC | PRN
  Administered 2015-08-22: 20 mL via INTRAVENOUS

## 2015-09-12 ENCOUNTER — Encounter: Payer: Self-pay | Admitting: Family Medicine

## 2015-09-12 ENCOUNTER — Ambulatory Visit (INDEPENDENT_AMBULATORY_CARE_PROVIDER_SITE_OTHER): Payer: 59 | Admitting: Family Medicine

## 2015-09-12 VITALS — BP 108/76 | HR 78 | Temp 98.9°F | Ht 71.2 in | Wt 215.0 lb

## 2015-09-12 DIAGNOSIS — F411 Generalized anxiety disorder: Secondary | ICD-10-CM | POA: Diagnosis not present

## 2015-09-12 MED ORDER — FLUOXETINE HCL 40 MG PO CAPS: 40.0000 mg | ORAL_CAPSULE | Freq: Every day | ORAL | Status: DC

## 2015-09-12 MED ORDER — BUSPIRONE HCL 10 MG PO TABS: 10.0000 mg | ORAL_TABLET | Freq: Three times a day (TID) | ORAL | Status: DC

## 2015-09-12 NOTE — Assessment & Plan Note (Signed)
Exacerbated right now. Will increase prozac to 40mg  daily and increase buspar to 10mg  TID PRN. Call with any concerns and recheck 4-6 weeks.

## 2015-09-12 NOTE — Progress Notes (Signed)
BP 108/76 mmHg  Pulse 78  Temp(Src) 98.9 F (37.2 C)  Ht 5' 11.2" (1.808 m)  Wt 215 lb (97.523 kg)  BMI 29.83 kg/m2  SpO2 98%   Subjective:    Patient ID: Christopher Davenport, male    DOB: 1975-08-18, 40 y.o.   MRN: [id number]  HPI: [name] is a 40 y.o. male  Chief Complaint  Patient presents with  . OTHER    Patient states that he would like to discuss his MRI results and symptoms.   Vertigo has gotten better. Parethesias is gone as well. Thinks that it has been more stress.   ANXIETY/STRESS Duration: a couple of months- mainly at work Anxious mood: yes  Excessive worrying: yes Irritability: yes  Sweating: yes Nausea: no Palpitations:yes Hyperventilation: yes Panic attacks: no Agoraphobia: no  Obscessions/compulsions: yes Depressed mood: no Depression screen [facility] 2/9 05/17/2015 11/16/2014  Decreased Interest 0 0  Down, Depressed, Hopeless 1 0  PHQ - 2 Score 1 0  Altered sleeping - 2  Tired, decreased energy - 1  Change in appetite - 1  Feeling bad or failure about yourself  - 0  Trouble concentrating - 1  Moving slowly or fidgety/restless - 0  Suicidal thoughts - 0  PHQ-9 Score - 5  Difficult doing work/chores - Somewhat difficult   Anhedonia: no Weight changes: no Insomnia: yes hard to stay asleep  Hypersomnia: no Fatigue/loss of energy: no Feelings of worthlessness: no Feelings of guilt: no Impaired concentration/indecisiveness: yes Suicidal ideations: no  Crying spells: no Recent Stressors/Life Changes: yes   Relationship problems: no   Family stress: no     Financial stress: no    Job stress: yes    Recent death/loss: no  Relevant past medical, surgical, family and social history reviewed and updated as indicated. Interim medical history since our last visit reviewed. Allergies and medications reviewed and updated.  Review of Systems  Constitutional: Negative.   Respiratory: Negative.   Cardiovascular: Negative.    Psychiatric/Behavioral: Negative.     Per HPI unless specifically indicated above     Objective:    BP 108/76 mmHg  Pulse 78  Temp(Src) 98.9 F (37.2 C)  Ht 5' 11.2" (1.808 m)  Wt 215 lb (97.523 kg)  BMI 29.83 kg/m2  SpO2 98%  Wt Readings from Last 3 Encounters:  09/12/15 215 lb (97.523 kg)  07/30/15 219 lb (99.338 kg)  07/23/15 222 lb (100.699 kg)    Physical Exam  Constitutional: He is oriented to person, place, and time. He appears well-developed and well-nourished. No distress.  HENT:  Head: Normocephalic and atraumatic.  Right Ear: Hearing normal.  Left Ear: Hearing normal.  Nose: Nose normal.  Eyes: Conjunctivae and lids are normal. Right eye exhibits no discharge. Left eye exhibits no discharge. No scleral icterus.  Cardiovascular: Normal rate, regular rhythm, normal heart sounds and intact distal pulses.  Exam reveals no gallop and no friction rub.   No murmur heard. Pulmonary/Chest: Effort normal and breath sounds normal. No respiratory distress. He has no wheezes. He has no rales. He exhibits no tenderness.  Musculoskeletal: Normal range of motion.  Neurological: He is alert and oriented to person, place, and time.  Skin: Skin is warm, dry and intact. No rash noted. He is not diaphoretic. No erythema.  Psychiatric: He has a normal mood and affect. His speech is normal and behavior is normal. Judgment and thought content normal. Cognition and memory are normal.  Nursing note and vitals reviewed.  Results for orders placed or performed in visit on 08/13/15  Lyme Ab/Western Blot Reflex  Result Value Ref Range   Lyme IgG/IgM Ab <0.91 0.00 - 0.90 ISR   LYME DISEASE AB, QUANT, IGM <0.80 0.00 - 0.79 index  Rocky mtn spotted fvr abs pnl(IgG+IgM)  Result Value Ref Range   RMSF IgG Negative Negative   RMSF IgM 0.32 0.00 - 123456 index  Ehrlichia Antibody Panel  Result Value Ref Range   E.Chaffeensis (HME) IgG Negative Neg:<1:64   E. Chaffeensis (HME) IgM Titer  Negative Neg:<1:20   HGE IgG Titer Negative Neg:<1:64   HGE IgM Titer Negative Neg:<1:20  Babesia microti Antibody Panel  Result Value Ref Range   Babesia microti IgM <1:10 Neg:<1:10   Babesia microti IgG [id number]  Helicobacter pylori abs-IgG+IgA, bld  Result Value Ref Range   H. pylori, IgA Abs <9.0 0.0 - 8.9 units   H Pylori IgG <0.9 0.0 - 0.8 U/mL      Assessment & Plan:   Problem List Items Addressed This Visit      Other   Anxiety disorder - Primary    Exacerbated right now. Will increase prozac to 40mg  daily and increase buspar to 10mg  TID PRN. Call with any concerns and recheck 4-6 weeks.           Follow up plan: Return 4-6 weeks, for Follow up mood.

## 2015-10-17 ENCOUNTER — Encounter (INDEPENDENT_AMBULATORY_CARE_PROVIDER_SITE_OTHER): Payer: Self-pay

## 2015-11-08 ENCOUNTER — Encounter: Payer: Self-pay | Admitting: Family Medicine

## 2015-12-11 ENCOUNTER — Other Ambulatory Visit: Payer: Self-pay | Admitting: Family Medicine

## 2015-12-11 NOTE — Telephone Encounter (Signed)
rx

## 2016-06-15 ENCOUNTER — Telehealth: Payer: Self-pay | Admitting: Family Medicine

## 2016-06-15 ENCOUNTER — Other Ambulatory Visit: Payer: Self-pay | Admitting: Family Medicine

## 2016-06-15 NOTE — Telephone Encounter (Signed)
Called and let patient know that his fluoxetine prescription was sent in today. Patient states that he is going to schedule a f/up when his schedule allows for him to do so.

## 2016-06-15 NOTE — Telephone Encounter (Signed)
Patient called to make sure Dr Wynetta Emery got the request for his refills from [facility] [employer]

## 2016-06-15 NOTE — Telephone Encounter (Signed)
Routing to provider. No follow up on file. 

## 2016-07-31 ENCOUNTER — Ambulatory Visit (INDEPENDENT_AMBULATORY_CARE_PROVIDER_SITE_OTHER): Payer: BLUE CROSS/BLUE SHIELD | Admitting: Family Medicine

## 2016-07-31 ENCOUNTER — Encounter: Payer: Self-pay | Admitting: Family Medicine

## 2016-07-31 VITALS — BP 115/80 | HR 78 | Temp 98.6°F | Wt 220.1 lb

## 2016-07-31 DIAGNOSIS — N451 Epididymitis: Secondary | ICD-10-CM | POA: Diagnosis not present

## 2016-07-31 DIAGNOSIS — N50819 Testicular pain, unspecified: Secondary | ICD-10-CM

## 2016-07-31 LAB — UA/M W/RFLX CULTURE, ROUTINE
Bilirubin, UA: NEGATIVE
Glucose, UA: NEGATIVE
Ketones, UA: NEGATIVE
Leukocytes, UA: NEGATIVE
Nitrite, UA: NEGATIVE
Protein, UA: NEGATIVE
RBC, UA: NEGATIVE
Specific Gravity, UA: 1.01 (ref 1.005–1.030)
Urobilinogen, Ur: 2 mg/dL — ABNORMAL HIGH (ref 0.2–1.0)
pH, UA: 6 (ref 5.0–7.5)

## 2016-07-31 LAB — MICROSCOPIC EXAMINATION
Bacteria, UA: NONE SEEN
RBC, UA: NONE SEEN /hpf (ref 0–?)
WBC, UA: NONE SEEN /hpf (ref 0–?)

## 2016-07-31 MED ORDER — CIPROFLOXACIN HCL 500 MG PO TABS: 500.0000 mg | ORAL_TABLET | Freq: Two times a day (BID) | ORAL | 0 refills | Status: DC

## 2016-07-31 NOTE — Progress Notes (Signed)
BP 115/80 (BP Location: Left Arm, Patient Position: Sitting, Cuff Size: Large)   Pulse 78   Temp 98.6 F (37 C)   Wt 220 lb 1.6 oz (99.8 kg)   SpO2 97%   BMI 30.53 kg/m    Subjective:    Patient ID: Christopher Davenport, male    DOB: 10/14/1975, 41 y.o.   MRN: [id number]  HPI: Christopher Davenport is a 41 y.o. male  Chief Complaint  Patient presents with  . Mass    in testicle   LUMP Duration: 5 days  Location: R testicle Onset: sudden Painful: yes Discomfort: yes Status:  changing Trauma: no Redness: no Bruising: no Recent infection: no Swollen lymph nodes: no Requesting removal: no History of cancer: no History of the same: no Associated signs and symptoms: No burning with urination, + hesitancy, No frequency, no urgency,   Relevant past medical, surgical, family and social history reviewed and updated as indicated. Interim medical history since our last visit reviewed. Allergies and medications reviewed and updated.  Review of Systems  Constitutional: Negative.   Respiratory: Negative.   Cardiovascular: Negative.   Genitourinary: Positive for decreased urine volume and testicular pain. Negative for difficulty urinating, discharge, dysuria, enuresis, flank pain, frequency, genital sores, hematuria, penile pain, penile swelling, scrotal swelling and urgency.  Psychiatric/Behavioral: Negative.     Per HPI unless specifically indicated above     Objective:    BP 115/80 (BP Location: Left Arm, Patient Position: Sitting, Cuff Size: Large)   Pulse 78   Temp 98.6 F (37 C)   Wt 220 lb 1.6 oz (99.8 kg)   SpO2 97%   BMI 30.53 kg/m   Wt Readings from Last 3 Encounters:  07/31/16 220 lb 1.6 oz (99.8 kg)  09/12/15 215 lb (97.5 kg)  07/30/15 219 lb (99.3 kg)    Physical Exam  Constitutional: He is oriented to person, place, and time. He appears well-developed and well-nourished. No distress.  HENT:  Head: Normocephalic and atraumatic.  Right Ear:  Hearing normal.  Left Ear: Hearing normal.  Nose: Nose normal.  Eyes: Conjunctivae and lids are normal. Right eye exhibits no discharge. Left eye exhibits no discharge. No scleral icterus.  Pulmonary/Chest: Effort normal. No respiratory distress.  Abdominal: Hernia confirmed negative in the right inguinal area and confirmed negative in the left inguinal area.  Genitourinary: Penis normal. Cremasteric reflex is present. Right testis shows tenderness (to epididymis on the R). Right testis shows no mass and no swelling. Right testis is descended. Cremasteric reflex is not absent on the right side. Left testis shows no mass, no swelling and no tenderness. Left testis is descended. Cremasteric reflex is not absent on the left side. Circumcised. No phimosis, paraphimosis, hypospadias, penile erythema or penile tenderness. No discharge found.  Musculoskeletal: Normal range of motion.  Neurological: He is alert and oriented to person, place, and time.  Skin: Skin is warm, dry and intact. No rash noted. He is not diaphoretic. No erythema. No pallor.  Psychiatric: He has a normal mood and affect. His speech is normal and behavior is normal. Judgment and thought content normal. Cognition and memory are normal.  Nursing note and vitals reviewed.   Results for orders placed or performed in visit on 08/13/15  Lyme Ab/Western Blot Reflex  Result Value Ref Range   Lyme IgG/IgM Ab <0.91 0.00 - 0.90 ISR   LYME DISEASE AB, QUANT, IGM <0.80 0.00 - 0.79 index  Rocky mtn spotted fvr abs pnl(IgG+IgM)  Result Value Ref Range   RMSF IgG Negative Negative   RMSF IgM 0.32 0.00 - 1.74 index  Ehrlichia Antibody Panel  Result Value Ref Range   E.Chaffeensis (HME) IgG Negative Neg:<1:64   E. Chaffeensis (HME) IgM Titer Negative Neg:<1:20   HGE IgG Titer Negative Neg:<1:64   HGE IgM Titer Negative Neg:<1:20  Babesia microti Antibody Panel  Result Value Ref Range   Babesia microti IgM <1:10 Neg:<1:10   Babesia  microti IgG <0:81 KGY:<1:85  Helicobacter pylori abs-IgG+IgA, bld  Result Value Ref Range   H. pylori, IgA Abs <9.0 0.0 - 8.9 units   H Pylori IgG <0.9 0.0 - 0.8 U/mL      Assessment & Plan:   Problem List Items Addressed This Visit    None    Visit Diagnoses    Epididymitis, right    -  Primary   Will treat with cipro. If not better in 1 week, will consider [place].   Testicle pain       UA negative. Seems to be due to epididymitis.   Relevant Orders   UA/M w/rflx Culture, Routine       Follow up plan: Return if symptoms worsen or fail to improve.

## 2016-07-31 NOTE — Patient Instructions (Addendum)
Epididymitis Epididymitis is swelling (inflammation) of the epididymis. The epididymis is a cord-like structure that is located along the top and back part of the testicle. It collects and stores sperm from the testicle. This condition can also cause pain and swelling of the testicle and scrotum. Symptoms usually start suddenly (acute epididymitis). Sometimes epididymitis starts gradually and lasts for a while (chronic epididymitis). This type may be harder to treat. What are the causes? In men 23 and younger, this condition is usually caused by a bacterial infection or sexually transmitted disease (STD), such as:  Gonorrhea.  Chlamydia. In men 52 and older who do not have anal sex, this condition is usually caused by bacteria from a blockage or abnormalities in the urinary system. These can result from:  Having a tube placed into the bladder (urinary catheter).  Having an enlarged or inflamed prostate gland.  Having recent urinary tract surgery. In men who have a condition that weakens the body's defense system (immune system), such as HIV, this condition can be caused by:  Other bacteria, including tuberculosis and syphilis.  Viruses.  Fungi. Sometimes this condition occurs without infection. That may happen if urine flows backward into the epididymis after heavy lifting or straining. What increases the risk? This condition is more likely to develop in men:  Who have unprotected sex with more than one partner.  Who have anal sex.  Who have recently had surgery.  Who have a urinary catheter.  Who have urinary problems.  Who have a suppressed immune system. What are the signs or symptoms? This condition usually begins suddenly with chills, fever, and pain behind the scrotum and in the testicle. Other symptoms include:  Swelling of the scrotum, testicle, or both.  Pain whenejaculatingor urinating.  Pain in the back or belly.  Nausea.  Itching and discharge from the  penis.  Frequent need to pass urine.  Redness and tenderness of the scrotum. How is this diagnosed? Your health care provider can diagnose this condition based on your symptoms and medical history. Your health care provider will also do a physical exam to ask about your symptoms and check your scrotum and testicle for swelling, pain, and redness. You may also have other tests, including:  Examination of discharge from the penis.  Urine tests for infections, such as STDs. Your health care provider may test you for other STDs, including HIV. How is this treated? Treatment for this condition depends on the cause. If your condition is caused by a bacterial infection, oral antibiotic medicine may be prescribed. If the bacterial infection has spread to your blood, you may need to receive IV antibiotics. Nonbacterial epididymitis is treated with home care that includes bed rest and elevation of the scrotum. Surgery may be needed to treat:  Bacterial epididymitis that causes pus to build up in the scrotum (abscess).  Chronic epididymitis that has not responded to other treatments. Follow these instructions at home: Medicines   Take over-the-counter and prescription medicines only as told by your health care provider.  If you were prescribed an antibiotic medicine, take it as told by your health care provider. Do not stop taking the antibiotic even if your condition improves. Sexual Activity   If your epididymitis was caused by an STD, avoid sexual activity until your treatment is complete.  Inform your sexual partner or partners if you test positive for an STD. They may need to be treated.Do not engage in sexual activity with your partner or partners until their treatment is  completed. General instructions   Return to your normal activities as told by your health care provider. Ask your health care provider what activities are safe for you.  Keep your scrotum elevated and supported while  resting. Ask your health care provider if you should wear a scrotal support, such as a jockstrap. Wear it as told by your health care provider.  If directed, apply ice to the affected area:  Put ice in a plastic bag.  Place a towel between your skin and the bag.  Leave the ice on for 20 minutes, 2-3 times per day.  Try taking a sitz bath to help with discomfort. This is a warm water bath that is taken while you are sitting down. The water should only come up to your hips and should cover your buttocks. Do this 3-4 times per day or as told by your health care provider.  Keep all follow-up visits as told by your health care provider. This is important. Contact a health care provider if:  You have a fever.  Your pain medicine is not helping.  Your pain is getting worse.  Your symptoms do not improve within three days. This information is not intended to replace advice given to you by your health care provider. Make sure you discuss any questions you have with your health care provider. Document Released: 03/06/2000 Document Revised: 08/15/2015 Document Reviewed: 07/25/2014 Elsevier Interactive Patient Education  2017 [employer].

## 2016-08-10 ENCOUNTER — Telehealth: Payer: Self-pay | Admitting: Family Medicine

## 2016-08-10 DIAGNOSIS — R229 Localized swelling, mass and lump, unspecified: Principal | ICD-10-CM

## 2016-08-10 DIAGNOSIS — IMO0002 Reserved for concepts with insufficient information to code with codable children: Secondary | ICD-10-CM

## 2016-08-10 NOTE — Telephone Encounter (Signed)
-----   Message from Valerie Roys, [place] sent at 07/31/2016 12:03 PM EDT ----- Call about his testicular mass/pain

## 2016-08-10 NOTE — Telephone Encounter (Signed)
Called and LMOM for Awais to call back about his pain. Await call back- if still feeling lump/pain, will arrange for [place].

## 2016-08-11 ENCOUNTER — Other Ambulatory Visit: Payer: Self-pay | Admitting: Family Medicine

## 2016-08-11 ENCOUNTER — Other Ambulatory Visit: Payer: Self-pay

## 2016-08-11 DIAGNOSIS — IMO0002 Reserved for concepts with insufficient information to code with codable children: Secondary | ICD-10-CM

## 2016-08-11 DIAGNOSIS — R229 Localized swelling, mass and lump, unspecified: Principal | ICD-10-CM

## 2016-08-11 NOTE — Telephone Encounter (Signed)
Pt called and stated that the lump seemed to still be the same and he would like a call back about what he needs to do.

## 2016-08-11 NOTE — Telephone Encounter (Signed)
Spoke with patient.  Ultrasound scheduled for 08/18/16 at 3:00 at [facility]. Patient notified of appointment.

## 2016-08-11 NOTE — Telephone Encounter (Signed)
Please let him know that I've put in the order for the ultrasound and we will wait to see what's going on. Thanks!

## 2016-08-11 NOTE — Telephone Encounter (Signed)
FYI

## 2016-08-14 ENCOUNTER — Other Ambulatory Visit: Payer: Self-pay | Admitting: Family Medicine

## 2016-08-14 MED ORDER — OMEPRAZOLE 40 MG PO CPDR: 40.0000 mg | DELAYED_RELEASE_CAPSULE | Freq: Every day | ORAL | 3 refills | Status: DC

## 2016-08-14 NOTE — Telephone Encounter (Signed)
Patient notified

## 2016-08-14 NOTE — Telephone Encounter (Signed)
Patient called to see if he could get his prescription for Prilosec refills resubmitted due to the refills expiring. Patient states he is experincing acid reflux again and would like to see if he could get the generic form of Prilosec due to affordability.   Please Advise.   Thank you

## 2016-08-14 NOTE — Telephone Encounter (Signed)
Routing to provider  

## 2016-08-18 ENCOUNTER — Ambulatory Visit
Admission: RE | Admit: 2016-08-18 | Discharge: 2016-08-18 | Disposition: A | Discharge status: Home / Self Care | Payer: BLUE CROSS/BLUE SHIELD | Source: Ambulatory Visit | Attending: Family Medicine | Admitting: Family Medicine

## 2016-08-18 DIAGNOSIS — N503 Cyst of epididymis: Secondary | ICD-10-CM | POA: Insufficient documentation

## 2016-08-18 DIAGNOSIS — N433 Hydrocele, unspecified: Secondary | ICD-10-CM | POA: Insufficient documentation

## 2016-08-18 DIAGNOSIS — R229 Localized swelling, mass and lump, unspecified: Secondary | ICD-10-CM | POA: Diagnosis not present

## 2016-08-18 DIAGNOSIS — IMO0002 Reserved for concepts with insufficient information to code with codable children: Secondary | ICD-10-CM

## 2016-08-19 ENCOUNTER — Telehealth: Payer: Self-pay | Admitting: Family Medicine

## 2016-08-19 DIAGNOSIS — N503 Cyst of epididymis: Secondary | ICD-10-CM | POA: Insufficient documentation

## 2016-08-19 NOTE — Telephone Encounter (Signed)
Called patient to let him know that his [place] was normal. Nothing to worry about!

## 2016-12-14 ENCOUNTER — Other Ambulatory Visit: Payer: Self-pay | Admitting: Family Medicine

## 2016-12-17 DIAGNOSIS — H1045 Other chronic allergic conjunctivitis: Secondary | ICD-10-CM | POA: Diagnosis not present

## 2017-03-04 DIAGNOSIS — J4530 Mild persistent asthma, uncomplicated: Secondary | ICD-10-CM | POA: Diagnosis not present

## 2017-03-04 DIAGNOSIS — J069 Acute upper respiratory infection, unspecified: Secondary | ICD-10-CM | POA: Diagnosis not present

## 2017-03-04 DIAGNOSIS — R05 Cough: Secondary | ICD-10-CM | POA: Diagnosis not present

## 2017-06-15 ENCOUNTER — Other Ambulatory Visit: Payer: Self-pay | Admitting: Family Medicine

## 2017-06-20 NOTE — Progress Notes (Signed)
BP 111/78 (BP Location: Left Arm, Patient Position: Sitting, Cuff Size: Normal)   Pulse 77   Temp 98.4 F (36.9 C)   Ht 6' (1.829 m)   Wt 222 lb 8 oz (100.9 kg)   SpO2 98%   BMI 30.18 kg/m    Subjective:    Patient ID: Christopher Davenport, male    DOB: 1975-04-16, 42 y.o.   MRN: [id number]  HPI: Christopher Davenport is a 42 y.o. male presenting on 06/21/2017 for comprehensive medical examination. Current medical complaints include:  Has been doing a lot. Taking classes, working, dealing with his regular life. Was drinking a night cap- stopped it recently and feeling better since then.   Has been having some pain in his L shoulder for months. Mainly with lifting, bench presses. Anterior. Near constant. Worse with internal rotation. No numbness or tingling. Worse with lifting. Better with massages and ice. NSAIDs helped a bit.   ANXIETY/STRESS Duration:exacerbated Anxious mood: yes  Excessive worrying: yes Irritability: no  Sweating: no Nausea: no Palpitations:no Hyperventilation: no Panic attacks: no Agoraphobia: no  Obscessions/compulsions: no Depressed mood: no Depression screen [facility] 2/9 07/31/2016 05/17/2015 11/16/2014  Decreased Interest 0 0 0  Down, Depressed, Hopeless 0 1 0  PHQ - 2 Score 0 1 0  Altered sleeping - - 2  Tired, decreased energy - - 1  Change in appetite - - 1  Feeling bad or failure about yourself  - - 0  Trouble concentrating - - 1  Moving slowly or fidgety/restless - - 0  Suicidal thoughts - - 0  PHQ-9 Score - - 5  Difficult doing work/chores - - Somewhat difficult   Anhedonia: no Weight changes: no Insomnia: yes hard to fall asleep  Hypersomnia: no Fatigue/loss of energy: yes Feelings of worthlessness: no Feelings of guilt: no Impaired concentration/indecisiveness: no Suicidal ideations: no  Crying spells: no Recent Stressors/Life Changes: no   Relationship problems: no   Family stress: no     Financial stress: yes    Job stress:  yes    Recent death/loss: no  He currently lives with: wife and kids Interim Problems from his last visit: no  Depression Screen done today and results listed below:  Depression screen [facility] 2/9 07/31/2016 05/17/2015 11/16/2014  Decreased Interest 0 0 0  Down, Depressed, Hopeless 0 1 0  PHQ - 2 Score 0 1 0  Altered sleeping - - 2  Tired, decreased energy - - 1  Change in appetite - - 1  Feeling bad or failure about yourself  - - 0  Trouble concentrating - - 1  Moving slowly or fidgety/restless - - 0  Suicidal thoughts - - 0  PHQ-9 Score - - 5  Difficult doing work/chores - - Somewhat difficult    Past Medical History:  Past Medical History:  Diagnosis Date  . Anxiety   . Asthma   . GERD (gastroesophageal reflux disease)     Surgical History:  History reviewed. No pertinent surgical history.  Medications:  Current Outpatient Medications on File Prior to Visit  Medication Sig  . b complex vitamins tablet Take 1 tablet by mouth daily.  . Multiple Vitamin (MULTIVITAMIN WITH MINERALS) TABS tablet Take 1 tablet by mouth daily.  . vitamin C (ASCORBIC ACID) 500 MG tablet Take 500 mg by mouth daily.   No current facility-administered medications on file prior to visit.     Allergies:  No Known Allergies  Social History:  Social History  Socioeconomic History  . Marital status: Married    Spouse name: Not on file  . Number of children: Not on file  . Years of education: Not on file  . Highest education level: Not on file  Occupational History  . Not on file  Social Needs  . Financial resource strain: Not on file  . Food insecurity:    Worry: Not on file    Inability: Not on file  . Transportation needs:    Medical: Not on file    Non-medical: Not on file  Tobacco Use  . Smoking status: Former Smoker    Last attempt to quit: 03/23/2012    Years since quitting: 5.2  . Smokeless tobacco: Never Used  Substance and Sexual Activity  . Alcohol use: Yes     Alcohol/week: 0.0 oz    Comment: 2 during the week  and 3-4 on weekends  . Drug use: No  . Sexual activity: Yes  Lifestyle  . Physical activity:    Days per week: Not on file    Minutes per session: Not on file  . Stress: Not on file  Relationships  . Social connections:    Talks on phone: Not on file    Gets together: Not on file    Attends religious service: Not on file    Active member of club or organization: Not on file    Attends meetings of clubs or organizations: Not on file    Relationship status: Not on file  . Intimate partner violence:    Fear of current or ex partner: Not on file    Emotionally abused: Not on file    Physically abused: Not on file    Forced sexual activity: Not on file  Other Topics Concern  . Not on file  Social History Narrative  . Not on file   Social History   Tobacco Use  Smoking Status Former Smoker  . Last attempt to quit: 03/23/2012  . Years since quitting: 5.2  Smokeless Tobacco Never Used   Social History   Substance and Sexual Activity  Alcohol Use Yes  . Alcohol/week: 0.0 oz   Comment: 2 during the week  and 3-4 on weekends    Family History:  Family History  Problem Relation Age of Onset  . Diabetes Mother   . Kidney disease Mother   . Heart disease Mother   . Hypertension Mother   . Hyperlipidemia Mother   . Dementia Maternal Grandmother   . Diabetes Maternal Grandfather   . Diabetes Paternal Grandmother     Past medical history, surgical history, medications, allergies, family history and social history reviewed with patient today and changes made to appropriate areas of the chart.   Review of Systems  Constitutional: Negative.   HENT: Negative.   Eyes: Negative.   Respiratory: Positive for shortness of breath (with asthma) and wheezing (with asthma). Negative for cough, hemoptysis and sputum production.   Cardiovascular: Negative.   Gastrointestinal: Negative.   Genitourinary: Negative.   Musculoskeletal:  Positive for joint pain. Negative for back pain, falls, myalgias and neck pain.  Skin: Negative.   Neurological: Negative.   Endo/Heme/Allergies: Positive for environmental allergies. Negative for polydipsia. Does not bruise/bleed easily.  Psychiatric/Behavioral: Negative for depression, hallucinations, memory loss, substance abuse and suicidal ideas. The patient is nervous/anxious. The patient does not have insomnia.     All other ROS negative except what is listed above and in the HPI.      Objective:  BP 111/78 (BP Location: Left Arm, Patient Position: Sitting, Cuff Size: Normal)   Pulse 77   Temp 98.4 F (36.9 C)   Ht 6' (1.829 m)   Wt 222 lb 8 oz (100.9 kg)   SpO2 98%   BMI 30.18 kg/m   Wt Readings from Last 3 Encounters:  06/21/17 222 lb 8 oz (100.9 kg)  07/31/16 220 lb 1.6 oz (99.8 kg)  09/12/15 215 lb (97.5 kg)    Physical Exam  Constitutional: He is oriented to person, place, and time. He appears well-developed and well-nourished. No distress.  HENT:  Head: Normocephalic and atraumatic.  Right Ear: Hearing, tympanic membrane, external ear and ear canal normal.  Left Ear: Hearing, tympanic membrane, external ear and ear canal normal.  Nose: Nose normal.  Mouth/Throat: Uvula is midline, oropharynx is clear and moist and mucous membranes are normal. No oropharyngeal exudate.  Eyes: Pupils are equal, round, and reactive to light. Conjunctivae, EOM and lids are normal. Right eye exhibits no discharge. Left eye exhibits no discharge. No scleral icterus.  Neck: Normal range of motion. Neck supple. No JVD present. No tracheal deviation present. No thyromegaly present.  Cardiovascular: Normal rate, regular rhythm, normal heart sounds and intact distal pulses. Exam reveals no gallop and no friction rub.  No murmur heard. Pulmonary/Chest: Effort normal and breath sounds normal. No stridor. No respiratory distress. He has no wheezes. He has no rales. He exhibits no tenderness.    Abdominal: Soft. Bowel sounds are normal. He exhibits no distension and no mass. There is no tenderness. There is no rebound and no guarding.  Genitourinary: Penis normal. No penile tenderness.  Musculoskeletal: He exhibits no deformity.  Lymphadenopathy:    He has no cervical adenopathy.  Neurological: He is alert and oriented to person, place, and time. He has normal reflexes. He displays normal reflexes. No cranial nerve deficit. He exhibits normal muscle tone. Coordination normal.  Skin: Skin is warm, dry and intact. No rash noted. He is not diaphoretic. No erythema. No pallor.  Psychiatric: He has a normal mood and affect. His speech is normal and behavior is normal. Judgment and thought content normal. Cognition and memory are normal.  Nursing note and vitals reviewed.    Shoulder: left    Inspection:  no swelling, ecchymosis, erythema or step off deformity.     Tenderness to Palpation:    Acromion: no    AC joint:no    Clavicle: no    Bicipital groove: yes    Scapular spine: no    Coracoid process: no    Humeral head: no    Supraspinatus tendon: no     Range of Motion:  Full Range of Motion bilaterally     Painful arc: no     Muscle Strength: 5/5 bilaterally     Neuro: Sensation WNL. and Upper extremity reflexes WNL.     Special Tests:     Neer sign: Negative    Hawkins sign: Negative    Cross arm adduction: Negative    Yergason sign: Negative    O'brien sign: Negative     Speed sign: Negative   Results for orders placed or performed in visit on 07/31/16  Microscopic Examination  Result Value Ref Range   WBC, UA None seen 0 - 5 /hpf   RBC, UA None seen 0 - 2 /hpf   Epithelial Cells (non renal) CANCELED    Bacteria, UA None seen None seen/Few  UA/M w/rflx Culture, Routine  Result Value  Ref Range   Specific Gravity, UA 1.010 1.005 - 1.030   pH, UA 6.0 5.0 - 7.5   Color, UA Yellow Yellow   Appearance Ur Clear Clear   Leukocytes, UA Negative Negative    Protein, UA Negative Negative/Trace   Glucose, UA Negative Negative   Ketones, UA Negative Negative   RBC, UA Negative Negative   Bilirubin, UA Negative Negative   Urobilinogen, Ur 2.0 (H) 0.2 - 1.0 mg/dL   Nitrite, UA Negative Negative   Microscopic Examination See below:       Assessment & Plan:   Problem List Items Addressed This Visit      Respiratory   Asthma    Stable. Continue current regimen. Continue to monitor. Call with any concerns. Refills given.       Relevant Medications   Fluticasone-Salmeterol (ADVAIR DISKUS) 100-50 MCG/DOSE AEPB   albuterol (PROAIR HFA) 108 (90 Base) MCG/ACT inhaler   Other Relevant Orders   CBC with Differential/Platelet   Comprehensive metabolic panel     Other   Anxiety disorder    Slightly exacerbated. Does not want to change medication. Continue current regimen. Continue to monitor. Call with any concerns. Refills given.       Relevant Medications   busPIRone (BUSPAR) 10 MG tablet   FLUoxetine (PROZAC) 40 MG capsule   Other Relevant Orders   CBC with Differential/Platelet   Comprehensive metabolic panel   TSH    Other Visit Diagnoses    Routine general medical examination at a health care facility    -  Primary   Vaccines updated. Screening labs checked today. Continue diet and exercise. Call with any concerns.    Relevant Orders   CBC with Differential/Platelet   Comprehensive metabolic panel   Lipid Panel w/o Chol/HDL Ratio   TSH   UA/M w/rflx Culture, Routine   Screening for HIV (human immunodeficiency virus)       Labs drawn today. Await results.    Relevant Orders   HIV antibody   Biceps tendinitis of left upper extremity       Will treat with naproxen and exercises. Check in by phone in 2 weeks. Call with any concerns.    Need for Tdap vaccination       Tdap given today. Call with any concerns.    Relevant Orders   Tdap vaccine greater than or equal to 7yo IM (Completed)      Discussed aspirin prophylaxis for  myocardial infarction prevention and decision was it was not indicated  LABORATORY TESTING:  Health maintenance labs ordered today as discussed above.   IMMUNIZATIONS:   - Tdap: Tetanus vaccination status reviewed: Tdap vaccination indicated and given today. - Influenza: Postponed to flu season - Pneumovax: Up to date  PATIENT COUNSELING:    Sexuality: Discussed sexually transmitted diseases, partner selection, use of condoms, avoidance of unintended pregnancy  and contraceptive alternatives.   Advised to avoid cigarette smoking.  I discussed with the patient that most people either abstain from alcohol or drink within safe limits (<=14/week and <=4 drinks/occasion for males, <=7/weeks and <= 3 drinks/occasion for females) and that the risk for alcohol disorders and other health effects rises proportionally with the number of drinks per week and how often a drinker exceeds daily limits.  Discussed cessation/primary prevention of drug use and availability of treatment for abuse.   Diet: Encouraged to adjust caloric intake to maintain  or achieve ideal body weight, to reduce intake of dietary saturated fat and total  fat, to limit sodium intake by avoiding high sodium foods and not adding table salt, and to maintain adequate dietary potassium and calcium preferably from fresh fruits, vegetables, and low-fat dairy products.    stressed the importance of regular exercise  Injury prevention: Discussed safety belts, safety helmets, smoke detector, smoking near bedding or upholstery.   Dental health: Discussed importance of regular tooth brushing, flossing, and dental visits.   Follow up plan: NEXT PREVENTATIVE PHYSICAL DUE IN 1 YEAR. Return in about 6 months (around 12/21/2017) for follow up asthma and mood.

## 2017-06-21 ENCOUNTER — Ambulatory Visit (INDEPENDENT_AMBULATORY_CARE_PROVIDER_SITE_OTHER): Payer: BLUE CROSS/BLUE SHIELD | Admitting: Family Medicine

## 2017-06-21 ENCOUNTER — Encounter: Payer: Self-pay | Admitting: Family Medicine

## 2017-06-21 VITALS — BP 111/78 | HR 77 | Temp 98.4°F | Ht 72.0 in | Wt 222.5 lb

## 2017-06-21 DIAGNOSIS — M7522 Bicipital tendinitis, left shoulder: Secondary | ICD-10-CM

## 2017-06-21 DIAGNOSIS — F411 Generalized anxiety disorder: Secondary | ICD-10-CM | POA: Diagnosis not present

## 2017-06-21 DIAGNOSIS — Z0000 Encounter for general adult medical examination without abnormal findings: Secondary | ICD-10-CM

## 2017-06-21 DIAGNOSIS — Z23 Encounter for immunization: Secondary | ICD-10-CM

## 2017-06-21 DIAGNOSIS — Z114 Encounter for screening for human immunodeficiency virus [HIV]: Secondary | ICD-10-CM | POA: Diagnosis not present

## 2017-06-21 DIAGNOSIS — J4520 Mild intermittent asthma, uncomplicated: Secondary | ICD-10-CM | POA: Diagnosis not present

## 2017-06-21 LAB — UA/M W/RFLX CULTURE, ROUTINE
Bilirubin, UA: NEGATIVE
Glucose, UA: NEGATIVE
Ketones, UA: NEGATIVE
Leukocytes, UA: NEGATIVE
Nitrite, UA: NEGATIVE
Protein, UA: NEGATIVE
RBC, UA: NEGATIVE
Specific Gravity, UA: 1.015 (ref 1.005–1.030)
Urobilinogen, Ur: 0.2 mg/dL (ref 0.2–1.0)
pH, UA: 6 (ref 5.0–7.5)

## 2017-06-21 MED ORDER — FLUTICASONE-SALMETEROL 100-50 MCG/DOSE IN AEPB: INHALATION_SPRAY | RESPIRATORY_TRACT | 1 refills | Status: DC

## 2017-06-21 MED ORDER — FLUOXETINE HCL 40 MG PO CAPS: 40.0000 mg | ORAL_CAPSULE | Freq: Every day | ORAL | 1 refills | Status: DC

## 2017-06-21 MED ORDER — NAPROXEN 500 MG PO TABS: 500.0000 mg | ORAL_TABLET | Freq: Two times a day (BID) | ORAL | 0 refills | Status: DC

## 2017-06-21 MED ORDER — BUSPIRONE HCL 10 MG PO TABS: 10.0000 mg | ORAL_TABLET | Freq: Three times a day (TID) | ORAL | 1 refills | Status: DC

## 2017-06-21 MED ORDER — OMEPRAZOLE 40 MG PO CPDR: 40.0000 mg | DELAYED_RELEASE_CAPSULE | Freq: Every day | ORAL | 3 refills | Status: DC

## 2017-06-21 MED ORDER — ALBUTEROL SULFATE HFA 108 (90 BASE) MCG/ACT IN AERS: INHALATION_SPRAY | RESPIRATORY_TRACT | 6 refills | Status: DC

## 2017-06-21 NOTE — Assessment & Plan Note (Signed)
Slightly exacerbated. Does not want to change medication. Continue current regimen. Continue to monitor. Call with any concerns. Refills given.

## 2017-06-21 NOTE — Assessment & Plan Note (Signed)
Stable. Continue current regimen. Continue to monitor. Call with any concerns. Refills given.  

## 2017-06-21 NOTE — Patient Instructions (Addendum)
Biceps Tendon Tendinitis (Proximal) and Tenosynovitis Rehab Ask your health care provider which exercises are safe for you. Do exercises exactly as told by your health care provider and adjust them as directed. It is normal to feel mild stretching, pulling, tightness, or discomfort as you do these exercises, but you should stop right away if you feel sudden pain or your pain gets worse.Do not begin these exercises until told by your health care provider. Stretching and range of motion exercises These exercises warm up your muscles and joints and improve the movement and flexibility of your arm and shoulder. These exercises also help to relieve pain and stiffness. Exercise A: Shoulder flexion  1. Stand facing a wall. Put your left / right hand on the wall. 2. Slide your left / right hand up the wall. Stop when you feel a stretch in your shoulder, or when you reach the angle that is recommended by your health care provider. ? Use your other hand to help raise your arm, if needed. ? As your hand gets higher, you may need to step closer to the wall. ? Avoid shrugging your shoulder while you raise your arm. To do this, keep your shoulder blade tucked down toward your spine. 3. Hold for __________ seconds. 4. Slowly return to the starting position. Use your other arm to help, if needed. Repeat __________ times. Complete this exercise __________ times a day. Exercise B: Posterior capsule stretch ( passive horizontal adduction) 1. Sit or stand and pull your left / right elbow across your chest, toward your other shoulder. Stop when you feel a gentle stretch in the back of your shoulder and upper arm. ? Keep your arm at shoulder height. ? Keep your arm as close to your body as you comfortably can. 2. Hold for __________ seconds. 3. Slowly return to the starting position. Repeat __________ times. Complete this exercise __________ times a day. Strengthening exercises These exercises build strength and  endurance in your arm and shoulder. Endurance is the ability to use your muscles for a long time, even after your muscles get tired. Exercise C: Elbow flexion, supinated  1. Sit on a stable chair without armrests, or stand. 2. If directed, hold a __________ weight in your left / right hand, or hold an exercise band with both hands. Your palms should face up toward the ceiling at the starting position. 3. Bend your left / right elbow and move your hand up toward your shoulder. Keep your other arm straight down, in the starting position. 4. Slowly return to the starting position. Repeat __________ times. Complete this exercise __________ times a day. Exercise D: Scapular protraction, supine  1. Lie on your back on a firm surface. If directed, hold a __________ weight in your left / right hand. 2. Raise your left / right arm straight into the air so your hand is directly above your shoulder joint. 3. Push the weight into the air so your shoulder lifts off of the surface that you are lying on. Do not move your head, neck, or back. 4. Hold for __________ seconds. 5. Slowly return to the starting position. Let your muscles relax completely before you repeat this exercise. Repeat __________ times. Complete this exercise __________ times a day. Exercise E: Scapular retraction  1. Sit in a stable chair without armrests, or stand. 2. Secure an exercise band to a stable object in front of you so the band is at shoulder height. 3. Hold one end of the exercise band in   each hand. 4. Squeeze your shoulder blades together and move your elbows slightly behind you. Do not shrug your shoulders. 5. Hold for __________ seconds. 6. Slowly return to the starting position. Repeat __________ times. Complete this exercise __________ times a day. This information is not intended to replace advice given to you by your health care provider. Make sure you discuss any questions you have with your health care  provider. Document Released: 03/09/2005 Document Revised: 11/14/2015 Document Reviewed: 02/15/2015 Elsevier Interactive Patient Education  2018 [place] Maintenance, Male A healthy lifestyle and preventive care is important for your health and wellness. Ask your health care provider about what schedule of regular examinations is right for you. What should I know about weight and diet? Eat a Healthy Diet  Eat plenty of vegetables, fruits, whole grains, low-fat dairy products, and lean protein.  Do not eat a lot of foods high in solid fats, added sugars, or salt.  Maintain a Healthy Weight Regular exercise can help you achieve or maintain a healthy weight. You should:  Do at least 150 minutes of exercise each week. The exercise should increase your heart rate and make you sweat (moderate-intensity exercise).  Do strength-training exercises at least twice a week.  Watch Your Levels of Cholesterol and Blood Lipids  Have your blood tested for lipids and cholesterol every 5 years starting at 44 years of age. If you are at high risk for heart disease, you should start having your blood tested when you are 44 years old. You may need to have your cholesterol levels checked more often if: ? Your lipid or cholesterol levels are high. ? You are older than 44 years of age. ? You are at high risk for heart disease.  What should I know about cancer screening? Many types of cancers can be detected early and may often be prevented. Lung Cancer  You should be screened every year for lung cancer if: ? You are a current smoker who has smoked for at least 30 years. ? You are a former smoker who has quit within the past 15 years.  Talk to your health care provider about your screening options, when you should start screening, and how often you should be screened.  Colorectal Cancer  Routine colorectal cancer screening usually begins at 44 years of age and should be repeated every 5-10  years until you are 44 years old. You may need to be screened more often if early forms of precancerous polyps or small growths are found. Your health care provider may recommend screening at an earlier age if you have risk factors for colon cancer.  Your health care provider may recommend using home test kits to check for hidden blood in the stool.  A small camera at the end of a tube can be used to examine your colon (sigmoidoscopy or colonoscopy). This checks for the earliest forms of colorectal cancer.  Prostate and Testicular Cancer  Depending on your age and overall health, your health care provider may do certain tests to screen for prostate and testicular cancer.  Talk to your health care provider about any symptoms or concerns you have about testicular or prostate cancer.  Skin Cancer  Check your skin from head to toe regularly.  Tell your health care provider about any new moles or changes in moles, especially if: ? There is a change in a mole's size, shape, or color. ? You have a mole that is larger than a pencil eraser.  Always use sunscreen. Apply sunscreen liberally and repeat throughout the day.  Protect yourself by wearing long sleeves, pants, a wide-brimmed hat, and sunglasses when outside.  What should I know about heart disease, diabetes, and high blood pressure?  If you are 30-51 years of age, have your blood pressure checked every 3-5 years. If you are 50 years of age or older, have your blood pressure checked every year. You should have your blood pressure measured twice-once when you are at a hospital or clinic, and once when you are not at a hospital or clinic. Record the average of the two measurements. To check your blood pressure when you are not at a hospital or clinic, you can use: ? An automated blood pressure machine at a pharmacy. ? A home blood pressure monitor.  Talk to your health care provider about your target blood pressure.  If you are between  81-43 years old, ask your health care provider if you should take aspirin to prevent heart disease.  Have regular diabetes screenings by checking your fasting blood sugar level. ? If you are at a normal weight and have a low risk for diabetes, have this test once every three years after the age of 50. ? If you are overweight and have a high risk for diabetes, consider being tested at a younger age or more often.  A one-time screening for abdominal aortic aneurysm (AAA) by ultrasound is recommended for men aged 39-75 years who are current or former smokers. What should I know about preventing infection? Hepatitis B If you have a higher risk for hepatitis B, you should be screened for this virus. Talk with your health care provider to find out if you are at risk for hepatitis B infection. Hepatitis C Blood testing is recommended for:  Everyone born from [birth year] through 1967.  Anyone with known risk factors for hepatitis C.  Sexually Transmitted Diseases (STDs)  You should be screened each year for STDs including gonorrhea and chlamydia if: ? You are sexually active and are younger than 44 years of age. ? You are older than 44 years of age and your health care provider tells you that you are at risk for this type of infection. ? Your sexual activity has changed since you were last screened and you are at an increased risk for chlamydia or gonorrhea. Ask your health care provider if you are at risk.  Talk with your health care provider about whether you are at high risk of being infected with HIV. Your health care provider may recommend a prescription medicine to help prevent HIV infection.  What else can I do?  Schedule regular health, dental, and eye exams.  Stay current with your vaccines (immunizations).  Do not use any tobacco products, such as cigarettes, chewing tobacco, and e-cigarettes. If you need help quitting, ask your health care provider.  Limit alcohol intake to no more than  2 drinks per day. One drink equals 12 ounces of beer, 5 ounces of wine, or 1 ounces of hard liquor.  Do not use street drugs.  Do not share needles.  Ask your health care provider for help if you need support or information about quitting drugs.  Tell your health care provider if you often feel depressed.  Tell your health care provider if you have ever been abused or do not feel safe at home. This information is not intended to replace advice given to you by your health care provider. Make sure you discuss any  questions you have with your health care provider. Document Released: 09/05/2007 Document Revised: 11/06/2015 Document Reviewed: 12/11/2014 Elsevier Interactive Patient Education  2018 [employer]. Tdap Vaccine (Tetanus, Diphtheria and Pertussis): What You Need to Know 1. Why get vaccinated? Tetanus, diphtheria and pertussis are very serious diseases. Tdap vaccine can protect [place] from these diseases. And, Tdap vaccine given to pregnant women can protect newborn babies against pertussis. TETANUS (Lockjaw) is rare in the [place] today. It causes painful muscle tightening and stiffness, usually all over the body.  It can lead to tightening of muscles in the head and neck so you can't open your mouth, swallow, or sometimes even breathe. Tetanus kills about 1 out of 10 people who are infected even after receiving the best medical care.  DIPHTHERIA is also rare in the [place] today. It can cause a thick coating to form in the back of the throat.  It can lead to breathing problems, heart failure, paralysis, and death.  PERTUSSIS (Whooping Cough) causes severe coughing spells, which can cause difficulty breathing, vomiting and disturbed sleep.  It can also lead to weight loss, incontinence, and rib fractures. Up to 2 in 100 adolescents and 5 in 100 adults with pertussis are hospitalized or have complications, which could include pneumonia or death.  These diseases are caused  by bacteria. Diphtheria and pertussis are spread from person to person through secretions from coughing or sneezing. Tetanus enters the body through cuts, scratches, or wounds. Before vaccines, as many as 200,000 cases of diphtheria, 200,000 cases of pertussis, and hundreds of cases of tetanus, were reported in the [place] each year. Since vaccination began, reports of cases for tetanus and diphtheria have dropped by about 99% and for pertussis by about 80%. 2. Tdap vaccine Tdap vaccine can protect adolescents and adults from tetanus, diphtheria, and pertussis. One dose of Tdap is routinely given at age 58 or 55. People who did not get Tdap at that age should get it as soon as possible. Tdap is especially important for healthcare professionals and anyone having close contact with a baby younger than 12 months. Pregnant women should get a dose of Tdap during every pregnancy, to protect the newborn from pertussis. Infants are most at risk for severe, life-threatening complications from pertussis. Another vaccine, called Td, protects against tetanus and diphtheria, but not pertussis. A Td booster should be given every 10 years. Tdap may be given as one of these boosters if you have never gotten Tdap before. Tdap may also be given after a severe cut or burn to prevent tetanus infection. Your doctor or the person giving you the vaccine can give you more information. Tdap may safely be given at the same time as other vaccines. 3. Some people should not get this vaccine  A person who has ever had a life-threatening allergic reaction after a previous dose of any diphtheria, tetanus or pertussis containing vaccine, OR has a severe allergy to any part of this vaccine, should not get Tdap vaccine. Tell the person giving the vaccine about any severe allergies.  Anyone who had coma or long repeated seizures within 7 days after a childhood dose of DTP or DTaP, or a previous dose of Tdap, should not get Tdap,  unless a cause other than the vaccine was found. They can still get Td.  Talk to your doctor if you: ? have seizures or another nervous system problem, ? had severe pain or swelling after any vaccine containing diphtheria, tetanus or pertussis, ?  ever had a condition called Guillain-Barr Syndrome (GBS), ? aren't feeling well on the day the shot is scheduled. 4. Risks With any medicine, including vaccines, there is a chance of side effects. These are usually mild and go away on their own. Serious reactions are also possible but are rare. Most people who get Tdap vaccine do not have any problems with it. Mild problems following Tdap: (Did not interfere with activities)  Pain where the shot was given (about 3 in 4 adolescents or 2 in 3 adults)  Redness or swelling where the shot was given (about 1 person in 5)  Mild fever of at least 100.67F (up to about 1 in 25 adolescents or 1 in 100 adults)  Headache (about 3 or 4 people in 10)  Tiredness (about 1 person in 3 or 4)  Nausea, vomiting, diarrhea, stomach ache (up to 1 in 4 adolescents or 1 in 10 adults)  Chills, sore joints (about 1 person in 10)  Body aches (about 1 person in 3 or 4)  Rash, swollen glands (uncommon)  Moderate problems following Tdap: (Interfered with activities, but did not require medical attention)  Pain where the shot was given (up to 1 in 5 or 6)  Redness or swelling where the shot was given (up to about 1 in 16 adolescents or 1 in 12 adults)  Fever over 102F (about 1 in 100 adolescents or 1 in 250 adults)  Headache (about 1 in 7 adolescents or 1 in 10 adults)  Nausea, vomiting, diarrhea, stomach ache (up to 1 or 3 people in 100)  Swelling of the entire arm where the shot was given (up to about 1 in 500).  Severe problems following Tdap: (Unable to perform usual activities; required medical attention)  Swelling, severe pain, bleeding and redness in the arm where the shot was given  (rare).  Problems that could happen after any vaccine:  People sometimes faint after a medical procedure, including vaccination. Sitting or lying down for about 15 minutes can help prevent fainting, and injuries caused by a fall. Tell your doctor if you feel dizzy, or have vision changes or ringing in the ears.  Some people get severe pain in the shoulder and have difficulty moving the arm where a shot was given. This happens very rarely.  Any medication can cause a severe allergic reaction. Such reactions from a vaccine are very rare, estimated at fewer than 1 in a million doses, and would happen within a few minutes to a few hours after the vaccination. As with any medicine, there is a very remote chance of a vaccine causing a serious injury or death. The safety of vaccines is always being monitored. For more information, visit: http://www.aguilar.org/ 5. What if there is a serious problem? What should I look for? Look for anything that concerns you, such as signs of a severe allergic reaction, very high fever, or unusual behavior. Signs of a severe allergic reaction can include hives, swelling of the face and throat, difficulty breathing, a fast heartbeat, dizziness, and weakness. These would usually start a few minutes to a few hours after the vaccination. What should I do?  If you think it is a severe allergic reaction or other emergency that can't wait, call 9-1-1 or get the person to the nearest hospital. Otherwise, call your doctor.  Afterward, the reaction should be reported to the Vaccine Adverse Event Reporting System (VAERS). Your doctor might file this report, or you can do it yourself through the VAERS  web site at [employer].SamedayNews.es, or by calling 734-026-7637. ? VAERS does not give medical advice. 6. The National Vaccine Injury Compensation Program The [employer] Vaccine Injury Compensation Program (VICP) is a federal program that was created to compensate people who may have  been injured by certain vaccines. Persons who believe they may have been injured by a vaccine can learn about the program and about filing a claim by calling 713-577-2437 or visiting the [place] website at GoldCloset.com.ee. There is a time limit to file a claim for compensation. 7. How can I learn more?  Ask your doctor. He or she can give you the vaccine package insert or suggest other sources of information.  Call your local or state health department.  Contact the Centers for Disease Control and Prevention (CDC): ? Call 781-540-5235 (1-800-CDC-INFO) or ? Visit CDC's website at http://hunter.com/ CDC Tdap Vaccine VIS (05/16/13) This information is not intended to replace advice given to you by your health care provider. Make sure you discuss any questions you have with your health care provider. Document Released: 09/08/2011 Document Revised: 11/28/2015 Document Reviewed: 11/28/2015 Elsevier Interactive Patient Education  2017 [employer].

## 2017-06-22 ENCOUNTER — Encounter: Payer: Self-pay | Admitting: Family Medicine

## 2017-06-22 LAB — CBC WITH DIFFERENTIAL/PLATELET
Basophils Absolute: 0 10*3/uL (ref 0.0–0.2)
Basos: 1 %
EOS (ABSOLUTE): 0.5 10*3/uL — ABNORMAL HIGH (ref 0.0–0.4)
Eos: 13 %
Hematocrit: 41.9 % (ref 37.5–51.0)
Hemoglobin: 14.2 g/dL (ref 13.0–17.7)
Immature Grans (Abs): 0 10*3/uL (ref 0.0–0.1)
Immature Granulocytes: 0 %
Lymphocytes Absolute: 1.6 10*3/uL (ref 0.7–3.1)
Lymphs: 41 %
MCH: 32.6 pg (ref 26.6–33.0)
MCHC: 33.9 g/dL (ref 31.5–35.7)
MCV: 96 fL (ref 79–97)
Monocytes Absolute: 0.2 10*3/uL (ref 0.1–0.9)
Monocytes: 6 %
Neutrophils Absolute: 1.6 10*3/uL (ref 1.4–7.0)
Neutrophils: 39 %
Platelets: 234 10*3/uL (ref 150–379)
RBC: 4.35 x10E6/uL (ref 4.14–5.80)
RDW: 14 % (ref 12.3–15.4)
WBC: 3.9 10*3/uL (ref 3.4–10.8)

## 2017-06-22 LAB — COMPREHENSIVE METABOLIC PANEL
ALT: 13 IU/L (ref 0–44)
AST: 13 IU/L (ref 0–40)
Albumin/Globulin Ratio: 1.8 (ref 1.2–2.2)
Albumin: 4.4 g/dL (ref 3.5–5.5)
Alkaline Phosphatase: 67 IU/L (ref 39–117)
BUN/Creatinine Ratio: 13 (ref 9–20)
BUN: 16 mg/dL (ref 6–24)
Bilirubin Total: 0.4 mg/dL (ref 0.0–1.2)
CO2: 24 mmol/L (ref 20–29)
Calcium: 9.4 mg/dL (ref 8.7–10.2)
Chloride: 106 mmol/L (ref 96–106)
Creatinine, Ser: 1.19 mg/dL (ref 0.76–1.27)
GFR calc Af Amer: 85 mL/min/{1.73_m2} (ref >59)
GFR calc non Af Amer: 74 mL/min/{1.73_m2} (ref >59)
Globulin, Total: 2.4 g/dL (ref 1.5–4.5)
Glucose: 99 mg/dL (ref 65–99)
Potassium: 4.5 mmol/L (ref 3.5–5.2)
Sodium: 143 mmol/L (ref 134–144)
Total Protein: 6.8 g/dL (ref 6.0–8.5)

## 2017-06-22 LAB — LIPID PANEL W/O CHOL/HDL RATIO
Cholesterol, Total: 207 mg/dL — ABNORMAL HIGH (ref 100–199)
HDL: 42 mg/dL (ref >39)
LDL Calculated: 137 mg/dL — ABNORMAL HIGH (ref 0–99)
Triglycerides: 138 mg/dL (ref 0–149)
VLDL Cholesterol Cal: 28 mg/dL (ref 5–40)

## 2017-06-22 LAB — TSH: TSH: 1.81 u[IU]/mL (ref 0.450–4.500)

## 2017-06-22 LAB — HIV ANTIBODY (ROUTINE TESTING W REFLEX): HIV Screen 4th Generation wRfx: NONREACTIVE

## 2017-07-05 ENCOUNTER — Telehealth: Payer: Self-pay | Admitting: Family Medicine

## 2017-07-05 NOTE — Telephone Encounter (Signed)
Called to see how his shoulder was doing. LMOM for him to call back.

## 2017-07-05 NOTE — Telephone Encounter (Signed)
-----   Message from Valerie Roys, DO sent at 06/21/2017 10:37 AM EDT ----- Call about shoulder pain

## 2017-11-18 ENCOUNTER — Telehealth: Payer: Self-pay | Admitting: Family Medicine

## 2017-11-18 NOTE — Telephone Encounter (Signed)
I think that's fine. He can stop it and let me know if his stomach starts acting up again.

## 2017-11-18 NOTE — Telephone Encounter (Signed)
Copied from [place] [phone]. Topic: Quick Communication - See Telephone Encounter >> Nov 18, 2017 12:32 PM Rutherford Nail, NT wrote: CRM for notification. See Telephone encounter for: 11/18/17. Patient is wanting to stop taking his omeprazole (PRILOSEC) 40 MG capsule. Would like to know what Dr Wynetta Emery thinks about this. Please advise CB#: [phone]

## 2017-11-18 NOTE — Telephone Encounter (Signed)
Patient notified

## 2018-02-18 ENCOUNTER — Telehealth: Payer: Self-pay | Admitting: Family Medicine

## 2018-02-18 MED ORDER — BUDESONIDE-FORMOTEROL FUMARATE 160-4.5 MCG/ACT IN AERO: 2.0000 | INHALATION_SPRAY | Freq: Two times a day (BID) | RESPIRATORY_TRACT | 3 refills | Status: DC

## 2018-02-18 NOTE — Telephone Encounter (Signed)
Called and spoke to patient. Explained to the patient about his inhalers. Patient states he has the albuterol but is needing the Advair. He states that he has been using this with the albuterol but doesn't think it has been working. Patient states he was told that Symbicort was a suggested alternative for the Advair. Pharmacy updated in chart, please advise.

## 2018-02-18 NOTE — Telephone Encounter (Signed)
Called [facility] in [place]. Pharmacy tech stated that it is not showing which albuterol is covered by the patient's insurance. Went ahead and submitted the PA to see if we can get it covered. Will call patient and let him know.

## 2018-02-18 NOTE — Telephone Encounter (Signed)
Copied from [place] [phone]. Topic: Quick Communication - Rx Refill/Question >> Feb 18, 2018 10:10 AM Scherrie Gerlach wrote: Medication:  albuterol (PROAIR HFA) 108 (90 Base) MCG/ACT inhaler Pt has new insurance and the pharmacy will not refill this without a prior auth. Or does pt need to try something else? Pt states the pharmacy is going to fax you something  [facility]  [phone] PCN  [id number]   MASA [id number] Pt is [employer] on vacation and needs this med asap. Pt had a refill, but his new insurance will not cover. [facility] [phone]  [address] [place], [place] [address]

## 2018-02-18 NOTE — Telephone Encounter (Signed)
Pt following up on this request. Pt states they advised him in the Symbicort was covered.  Pt states he thinks he needs a steroid.

## 2018-02-18 NOTE — Telephone Encounter (Signed)
Please have them give him whatever albuterol inhaler is covered.

## 2018-03-18 ENCOUNTER — Other Ambulatory Visit: Payer: Self-pay | Admitting: Family Medicine

## 2018-03-18 MED ORDER — FLUOXETINE HCL 20 MG PO TABS: 20.0000 mg | ORAL_TABLET | Freq: Every day | ORAL | 0 refills | Status: DC

## 2018-03-18 NOTE — Telephone Encounter (Signed)
Sent over the 20 mg dose, will route back to Dr. Wynetta Emery for review upon her return to clinic to see what she wants to do going forward

## 2018-03-18 NOTE — Telephone Encounter (Signed)
Requested medication (s) are due for refill today: yes  Requested medication (s) are on the active medication list: yes  Last refill:  06/21/17  #90  1 refill  Future visit scheduled: yes  Notes to clinic:  Pt requesting dose change to 20mg ? Call place to pt.  Left VM to call office to clarify request    Requested Prescriptions  Pending Prescriptions Disp Refills   FLUoxetine (PROZAC) 40 MG capsule 90 capsule 1    Sig: Take 1 capsule (40 mg total) by mouth daily.     Psychiatry:  Antidepressants - SSRI Failed - 03/18/2018 11:59 AM      Failed - Valid encounter within last 6 months    Recent Outpatient Visits          9 months ago Routine general medical examination at a health care facility   [facility], [place], DO   1 year ago Epididymitis, right   [place], [place], DO   2 years ago Generalized anxiety disorder   [place], [place], DO   2 years ago Syncope, unspecified syncope type   [place], [place], DO   2 years ago Generalized anxiety disorder   [place], Barb Merino, DO      Future Appointments            In 3 months Wynetta Emery, Barb Merino, DO [employer], [facility]

## 2018-03-18 NOTE — Telephone Encounter (Signed)
Pt called and stated that he would like to have his prozac decreased to 20 mg because it was "making him cloudy"; he uses [facility]; he can be contacted at [phone] and a message can be left at this number; the pt normally sees Dr Rayford Halsted, [facility]; advised pt that refill request have a 48-72 hour turn around; he verbalizes understanding and states that he ran out of medication today; notified Christan, will route to office for final disposition.

## 2018-03-18 NOTE — Telephone Encounter (Signed)
Copied from [place] [id number]. Topic: Quick Communication - Rx Refill/Question >> Mar 18, 2018 11:36 AM Leward Quan A wrote: Medication: FLUoxetine (PROZAC) 40 MG capsule  Patient is requesting Rx changed to 20mg   Has the patient contacted their pharmacy?  (Agent: If no, request that the patient contact the pharmacy for the refill.) (Agent: If yes, when and what did the pharmacy advise?)  Preferred Pharmacy (with phone number or street name): [id number] [name], [place] - [address] [place] [phone] (Phone) [phone] (Fax)    Agent: Please be advised that RX refills may take up to 3 business days. We ask that you follow-up with your pharmacy.

## 2018-03-18 NOTE — Telephone Encounter (Signed)
Left VM on patient cell number [phone]. I requested he call office to clarify his request for dose change on Prozac.

## 2018-03-21 NOTE — Telephone Encounter (Signed)
Advised to follow up in about a month, pt will check work schedule and contact us back to schedule.

## 2018-03-21 NOTE — Telephone Encounter (Signed)
Please schedule follow up appointment in about a month to see how he's doing on lower dose.

## 2018-04-16 ENCOUNTER — Other Ambulatory Visit: Payer: Self-pay | Admitting: Family Medicine

## 2018-04-18 ENCOUNTER — Telehealth: Payer: Self-pay | Admitting: Family Medicine

## 2018-04-18 NOTE — Telephone Encounter (Signed)
Copied from [place] [phone]. Topic: Quick Communication - Rx Refill/Question >> Apr 18, 2018  9:38 AM Virl Axe D wrote: Medication: FLUoxetine (PROZAC) 20 MG tablet / Pt stated he is unable to come to an OV before two weeks due to heavy load at work. He would like to know if 1 refill can be sent in until he can schedule an OV. Please advise pt. Stated he is doing better on lower dose.  Has the patient contacted their pharmacy? No. (Agent: If no, request that the patient contact the pharmacy for the refill.) (Agent: If yes, when and what did the pharmacy advise?)  Preferred Pharmacy (with phone number or street name): [name], [place] - [place] [phone] (Phone) [phone] (Fax)    Agent: Please be advised that RX refills may take up to 3 business days. We ask that you follow-up with your pharmacy.

## 2018-04-18 NOTE — Telephone Encounter (Signed)
Called patient to schedule an appointment to see how he is doing on a lower dose of Prozac. No answer, left message to call office and schedule an appointment. Pt of Dr. Wynetta Emery.  LOV  06/21/2017.

## 2018-04-26 ENCOUNTER — Encounter: Payer: Self-pay | Admitting: Unknown Physician Specialty

## 2018-04-26 ENCOUNTER — Ambulatory Visit: Payer: BC Managed Care – PPO | Admitting: Unknown Physician Specialty

## 2018-04-26 VITALS — BP 114/81 | HR 64 | Temp 98.1°F | Wt 229.4 lb

## 2018-04-26 DIAGNOSIS — J0100 Acute maxillary sinusitis, unspecified: Secondary | ICD-10-CM | POA: Diagnosis not present

## 2018-04-26 DIAGNOSIS — J069 Acute upper respiratory infection, unspecified: Secondary | ICD-10-CM | POA: Diagnosis not present

## 2018-04-26 DIAGNOSIS — B9689 Other specified bacterial agents as the cause of diseases classified elsewhere: Secondary | ICD-10-CM

## 2018-04-26 DIAGNOSIS — H109 Unspecified conjunctivitis: Secondary | ICD-10-CM

## 2018-04-26 MED ORDER — NEOMYCIN-POLYMYXIN-HC 3.5-10000-1 OT SOLN: 4.0000 [drp] | Freq: Four times a day (QID) | OTIC | 0 refills | Status: DC

## 2018-04-26 MED ORDER — AZITHROMYCIN 250 MG PO TABS: ORAL_TABLET | ORAL | 0 refills | Status: DC

## 2018-04-26 MED ORDER — POLYMYXIN B-TRIMETHOPRIM 10000-0.1 UNIT/ML-% OP SOLN: 1.0000 [drp] | OPHTHALMIC | 0 refills | Status: DC

## 2018-04-26 NOTE — Progress Notes (Addendum)
BP 114/81   Pulse 64   Temp 98.1 F (36.7 C) (Oral)   Wt 229 lb 6.4 oz (104.1 kg)   SpO2 96%   BMI 31.11 kg/m    Subjective:    Patient ID: Christopher Davenport, male    DOB: 1976-03-16, 42 y.o.   MRN: [id number]  HPI: Christopher Davenport is a 43 y.o. male  Chief Complaint  Patient presents with  . URI    pt states he has had congestion, sinus pressure, cough, and drainage for a week    Patient presents to office today with complaint of nasal pressure, sore throat, sinus headache, fatigue and drainage since Thursday. ()()  URI   This is a new problem. The current episode started in the past 7 days. The problem has been gradually worsening. There has been no fever. Associated symptoms include congestion, coughing, headaches, rhinorrhea, sinus pain, sneezing and a sore throat. He has tried NSAIDs, inhaler use and decongestant for the symptoms. The treatment provided mild relief.    Relevant past medical, surgical, family and social history reviewed and updated as indicated. Interim medical history since our last visit reviewed. Allergies and medications reviewed and updated.  Review of Systems  HENT: Positive for congestion, rhinorrhea, sinus pain, sneezing and sore throat.   Respiratory: Positive for cough.   Neurological: Positive for headaches.    Per HPI unless specifically indicated above     Objective:    BP 114/81   Pulse 64   Temp 98.1 F (36.7 C) (Oral)   Wt 229 lb 6.4 oz (104.1 kg)   SpO2 96%   BMI 31.11 kg/m   Wt Readings from Last 3 Encounters:  04/26/18 229 lb 6.4 oz (104.1 kg)  06/21/17 222 lb 8 oz (100.9 kg)  07/31/16 220 lb 1.6 oz (99.8 kg)    Physical Exam Constitutional:      General: He is not in acute distress.    Appearance: Normal appearance. He is well-developed.  HENT:     Head: Normocephalic and atraumatic.     Right Ear: Tympanic membrane and ear canal normal.     Left Ear: Tympanic membrane and ear canal normal.     Nose:  Mucosal edema present.     Right Sinus: Maxillary sinus tenderness present.     Left Sinus: Maxillary sinus tenderness present.     Mouth/Throat:     Pharynx: Oropharyngeal exudate present.  Eyes:     General: Lids are normal. No scleral icterus.       Right eye: No discharge.        Left eye: No discharge.     Conjunctiva/sclera:     Right eye: Right conjunctiva is injected.     Left eye: Left conjunctiva is injected.  Cardiovascular:     Rate and Rhythm: Normal rate and regular rhythm.  Pulmonary:     Effort: Pulmonary effort is normal. No respiratory distress.  Abdominal:     General: Bowel sounds are normal. There is no distension.     Palpations: There is no hepatomegaly or splenomegaly.     Tenderness: There is no abdominal tenderness.  Musculoskeletal: Normal range of motion.  Skin:    Coloration: Skin is not pale.     Findings: No rash.  Neurological:     Mental Status: He is alert and oriented to person, place, and time.  Psychiatric:        Behavior: Behavior normal.  Thought Content: Thought content normal.        Judgment: Judgment normal.     Results for orders placed or performed in visit on 06/21/17  CBC with Differential/Platelet  Result Value Ref Range   WBC 3.9 3.4 - 10.8 x10E3/uL   RBC 4.35 4.14 - 5.80 x10E6/uL   Hemoglobin 14.2 13.0 - 17.7 g/dL   Hematocrit 41.9 37.5 - 51.0 %   MCV 96 79 - 97 fL   MCH 32.6 26.6 - 33.0 pg   MCHC 33.9 31.5 - 35.7 g/dL   RDW 14.0 12.3 - 15.4 %   Platelets 234 150 - 379 x10E3/uL   Neutrophils 39 Not Estab. %   Lymphs 41 Not Estab. %   Monocytes 6 Not Estab. %   Eos 13 Not Estab. %   Basos 1 Not Estab. %   Neutrophils Absolute 1.6 1.4 - 7.0 x10E3/uL   Lymphocytes Absolute 1.6 0.7 - 3.1 x10E3/uL   Monocytes Absolute 0.2 0.1 - 0.9 x10E3/uL   EOS (ABSOLUTE) 0.5 (H) 0.0 - 0.4 x10E3/uL   Basophils Absolute 0.0 0.0 - 0.2 x10E3/uL   Immature Granulocytes 0 Not Estab. %   Immature Grans (Abs) 0.0 0.0 - 0.1 x10E3/uL   Comprehensive metabolic panel  Result Value Ref Range   Glucose 99 65 - 99 mg/dL   BUN 16 6 - 24 mg/dL   Creatinine, Ser 1.19 0.76 - 1.27 mg/dL   GFR calc non Af Amer 74 >59 mL/min/1.73   GFR calc Af Amer 85 >59 mL/min/1.73   BUN/Creatinine Ratio 13 9 - 20   Sodium 143 134 - 144 mmol/L   Potassium 4.5 3.5 - 5.2 mmol/L   Chloride 106 96 - 106 mmol/L   CO2 24 20 - 29 mmol/L   Calcium 9.4 8.7 - 10.2 mg/dL   Total Protein 6.8 6.0 - 8.5 g/dL   Albumin 4.4 3.5 - 5.5 g/dL   Globulin, Total 2.4 1.5 - 4.5 g/dL   Albumin/Globulin Ratio 1.8 1.2 - 2.2   Bilirubin Total 0.4 0.0 - 1.2 mg/dL   Alkaline Phosphatase 67 39 - 117 IU/L   AST 13 0 - 40 IU/L   ALT 13 0 - 44 IU/L  Lipid Panel w/o Chol/HDL Ratio  Result Value Ref Range   Cholesterol, Total 207 (H) 100 - 199 mg/dL   Triglycerides 138 0 - 149 mg/dL   HDL 42 >39 mg/dL   VLDL Cholesterol Cal 28 5 - 40 mg/dL   LDL Calculated 137 (H) 0 - 99 mg/dL  TSH  Result Value Ref Range   TSH 1.810 0.450 - 4.500 uIU/mL  UA/M w/rflx Culture, Routine  Result Value Ref Range   Specific Gravity, UA 1.015 1.005 - 1.030   pH, UA 6.0 5.0 - 7.5   Color, UA Yellow Yellow   Appearance Ur Clear Clear   Leukocytes, UA Negative Negative   Protein, UA Negative Negative/Trace   Glucose, UA Negative Negative   Ketones, UA Negative Negative   RBC, UA Negative Negative   Bilirubin, UA Negative Negative   Urobilinogen, Ur 0.2 0.2 - 1.0 mg/dL   Nitrite, UA Negative Negative  HIV antibody  Result Value Ref Range   HIV Screen 4th Generation wRfx Non Reactive Non Reactive      Assessment & Plan:   Problem List Items Addressed This Visit    None    Visit Diagnoses    Acute non-recurrent maxillary sinusitis    -  Primary   rx for  Z pack as worsening after 5 days   Relevant Medications   azithromycin (ZITHROMAX Z-PAK) 250 MG tablet   Viral upper respiratory tract infection       With secondary sinusisit   Relevant Medications   azithromycin (ZITHROMAX  Z-PAK) 250 MG tablet   Bacterial conjunctivitis of both eyes       Right worse than left.  Rx for Polytrim bilateral eys.     Relevant Medications   azithromycin (ZITHROMAX Z-PAK) 250 MG tablet       Follow up plan: Return if symptoms worsen or fail to improve.

## 2018-04-26 NOTE — Patient Instructions (Signed)
Bacterial Conjunctivitis, Adult Bacterial conjunctivitis is an infection of your conjunctiva. This is the clear membrane that covers the white part of your eye and the inner part of your eyelid. This infection can make your eye:  Red or pink.  Itchy. This condition spreads easily from person to person (is contagious) and from one eye to the other eye. What are the causes?  This condition is caused by germs (bacteria). You may get the infection if you come into close contact with: ? A person who has the infection. ? Items that have germs on them (are contaminated), such as face towels, contact lens solution, or eye makeup. What increases the risk? You are more likely to get this condition if you:  Have contact with people who have the infection.  Wear contact lenses.  Have a sinus infection.  Have had a recent eye injury or surgery.  Have a weak body defense system (immune system).  Have dry eyes. What are the signs or symptoms?   Thick, yellowish discharge from the eye.  Tearing or watery eyes.  Itchy eyes.  Burning feeling in your eyes.  Eye redness.  Swollen eyelids.  Blurred vision. How is this treated?   Antibiotic eye drops or ointment.  Antibiotic medicine taken by mouth. This is used for infections that do not get better with drops or ointment or that last more than 10 days.  Cool, wet cloths placed on the eyes.  Artificial tears used 2-6 times a day. Follow these instructions at home: Medicines  Take or apply your antibiotic medicine as told by your doctor. Do not stop taking or applying the antibiotic even if you start to feel better.  Take or apply over-the-counter and prescription medicines only as told by your doctor.  Do not touch your eyelid with the eye-drop bottle or the ointment tube. Managing discomfort  Wipe any fluid from your eye with a warm, wet washcloth or a cotton ball.  Place a clean, cool, wet cloth on your eye. Do this for  10-20 minutes, 3-4 times per day. General instructions  Do not wear contacts until the infection is gone. Wear glasses until your doctor says it is okay to wear contacts again.  Do not wear eye makeup until the infection is gone. Throw away old eye makeup.  Change or wash your pillowcase every day.  Do not share towels or washcloths.  Wash your hands often with soap and water. Use paper towels to dry your hands.  Do not touch or rub your eyes.  Do not drive or use heavy machinery if your vision is blurred. Contact a doctor if:  You have a fever.  You do not get better after 10 days. Get help right away if:  You have a fever and your symptoms get worse all of a sudden.  You have very bad pain when you move your eye.  Your face: ? Hurts. ? Is red. ? Is swollen.  You have sudden loss of vision. Summary  Bacterial conjunctivitis is an infection of your conjunctiva.  This infection spreads easily from person to person.  Wash your hands often with soap and water. Use paper towels to dry your hands.  Take or apply your antibiotic medicine as told by your doctor.  Contact a doctor if you have a fever or you do not get better after 10 days. This information is not intended to replace advice given to you by your health care provider. Make sure you discuss any   questions you have with your health care provider. Document Released: 12/17/2007 Document Revised: 10/13/2017 Document Reviewed: 10/13/2017 Elsevier Interactive Patient Education  2019 [employer].  Upper Respiratory Infection, Adult An upper respiratory infection (URI) is a common viral infection of the nose, throat, and upper air passages that lead to the lungs. The most common type of URI is the common cold. URIs usually get better on their own, without medical treatment. What are the causes? A URI is caused by a virus. You may catch a virus by:  Breathing in droplets from an infected person's cough or  sneeze.  Touching something that has been exposed to the virus (contaminated) and then touching your mouth, nose, or eyes. What increases the risk? You are more likely to get a URI if:  You are very young or very old.  It is autumn or winter.  You have close contact with others, such as at a daycare, school, or health care facility.  You smoke.  You have long-term (chronic) heart or lung disease.  You have a weakened disease-fighting (immune) system.  You have nasal allergies or asthma.  You are experiencing a lot of stress.  You work in an area that has poor air circulation.  You have poor nutrition. What are the signs or symptoms? A URI usually involves some of the following symptoms:  Runny or stuffy (congested) nose.  Sneezing.  Cough.  Sore throat.  Headache.  Fatigue.  Fever.  Loss of appetite.  Pain in your forehead, behind your eyes, and over your cheekbones (sinus pain).  Muscle aches.  Redness or irritation of the eyes.  Pressure in the ears or face. How is this diagnosed? This condition may be diagnosed based on your medical history and symptoms, and a physical exam. Your health care provider may use a cotton swab to take a mucus sample from your nose (nasal swab). This sample can be tested to determine what virus is causing the illness. How is this treated? URIs usually get better on their own within 7-10 days. You can take steps at home to relieve your symptoms. Medicines cannot cure URIs, but your health care provider may recommend certain medicines to help relieve symptoms, such as:  Over-the-counter cold medicines.  Cough suppressants. Coughing is a type of defense against infection that helps to clear the respiratory system, so take these medicines only as recommended by your health care provider.  Fever-reducing medicines. Follow these instructions at home: Activity  Rest as needed.  If you have a fever, stay home from work or school  until your fever is gone or until your health care provider says you are no longer contagious. Your health care provider may have you wear a face mask to prevent your infection from spreading. Relieving symptoms  Gargle with a salt-water mixture 3-4 times a day or as needed. To make a salt-water mixture, completely dissolve -1 tsp of salt in 1 cup of warm water.  Use a cool-mist humidifier to add moisture to the air. This can help you breathe more easily. Eating and drinking   Drink enough fluid to keep your urine pale yellow.  Eat soups and other clear broths. General instructions   Take over-the-counter and prescription medicines only as told by your health care provider. These include cold medicines, fever reducers, and cough suppressants.  Do not use any products that contain nicotine or tobacco, such as cigarettes and e-cigarettes. If you need help quitting, ask your health care provider.  Stay away  from secondhand smoke.  Stay up to date on all immunizations, including the yearly (annual) flu vaccine.  Keep all follow-up visits as told by your health care provider. This is important. How to prevent the spread of infection to others   URIs can be passed from person to person (are contagious). To prevent the infection from spreading: ? Wash your hands often with soap and water. If soap and water are not available, use hand sanitizer. ? Avoid touching your mouth, face, eyes, or nose. ? Cough or sneeze into a tissue or your sleeve or elbow instead of into your hand or into the air. Contact a health care provider if:  You are getting worse instead of better.  You have a fever or chills.  Your mucus is brown or red.  You have yellow or brown discharge coming from your nose.  You have pain in your face, especially when you bend forward.  You have swollen neck glands.  You have pain while swallowing.  You have white areas in the back of your throat. Get help right away  if:  You have shortness of breath that gets worse.  You have severe or persistent: ? Headache. ? Ear pain. ? Sinus pain. ? Chest pain.  You have chronic lung disease along with any of the following: ? Wheezing. ? Prolonged cough. ? Coughing up blood. ? A change in your usual mucus.  You have a stiff neck.  You have changes in your: ? Vision. ? Hearing. ? Thinking. ? Mood. Summary  An upper respiratory infection (URI) is a common infection of the nose, throat, and upper air passages that lead to the lungs.  A URI is caused by a virus.  URIs usually get better on their own within 7-10 days.  Medicines cannot cure URIs, but your health care provider may recommend certain medicines to help relieve symptoms. This information is not intended to replace advice given to you by your health care provider. Make sure you discuss any questions you have with your health care provider. Document Released: 09/02/2000 Document Revised: 10/23/2016 Document Reviewed: 10/23/2016 Elsevier Interactive Patient Education  2019 [employer].

## 2018-04-27 ENCOUNTER — Telehealth: Payer: Self-pay | Admitting: Family Medicine

## 2018-04-27 NOTE — Telephone Encounter (Signed)
Copied from [place] [phone]. Topic: General - Other >> Apr 26, 2018  3:19 PM Carolyn Stare wrote:  Pt said he thought he was picking up eye drops but when he picked up the RX it was ear drops and would like to clarify     neomycin-polymyxin-hydrocortisone (CORTISPORIN) OTIC solution   [name] [place] >> Apr 26, 2018  4:44 PM Kathrine Haddock, NP wrote: New rx for eye drops called in >> Apr 27, 2018 10:03 AM Vernona Rieger wrote: Patient states the pharmacy will not take back the ear drops and they were $75 , he said he did not realize they were ears drops until he got home. He would like to know is there something the doctor could do? [phone] >> Apr 27, 2018  1:06 PM Barth Kirks B wrote: Spoke with patient and apologized that the incorrect drops were sent to the pharmacy. Patient requested to be reimbursed for the provider's error since the pharmacy would not take back the unopened eardrops. I requested that the patient bring in his receipt for the eardrops that were incorrect as well as the eye drops that were correct. I also informed the patient that once I receive the items I will reach out to Office of Patient Experience for a resolution.

## 2018-05-02 NOTE — Telephone Encounter (Signed)
Pt is calling back in to speak with Alwyn Ren, confirmed with Alwyn Ren that she will return pt's call when she is available. Pt expressed understanding, he asked that if he is not available to please lvm.    Thanks.

## 2018-05-10 NOTE — Telephone Encounter (Signed)
Forwarding for pt call back.

## 2018-05-10 NOTE — Telephone Encounter (Signed)
Pt calling to speak with Alwyn Ren regarding the below issue.

## 2018-05-26 NOTE — Telephone Encounter (Signed)
LMOM advising patient that I am still working to obtain a possible refund for the incorrect prescription cost of $75.00. Will follow up with patient once I receive additional information.

## 2018-06-20 NOTE — Progress Notes (Deleted)
There were no vitals taken for this visit.   Subjective:    Patient ID: Christopher Davenport, male    DOB: Jan 29, 1976, 43 y.o.   MRN: [id number]  HPI: Christopher Davenport is a 43 y.o. male  No chief complaint on file.  ANXIETY/STRESS Duration:{Blank single:19197::"controlled","uncontrolled","better","worse","exacerbated","stable"} Anxious mood: {Blank single:19197::"yes","no"}  Excessive worrying: {Blank single:19197::"yes","no"} Irritability: {Blank single:19197::"yes","no"}  Sweating: {Blank single:19197::"yes","no"} Nausea: {Blank single:19197::"yes","no"} Palpitations:{Blank single:19197::"yes","no"} Hyperventilation: {Blank single:19197::"yes","no"} Panic attacks: {Blank single:19197::"yes","no"} Agoraphobia: {Blank single:19197::"yes","no"}  Obscessions/compulsions: {Blank single:19197::"yes","no"} Depressed mood: {Blank single:19197::"yes","no"} Depression screen [facility] 2/9 04/26/2018 07/31/2016 05/17/2015 11/16/2014  Decreased Interest 0 0 0 0  Down, Depressed, Hopeless 0 0 1 0  PHQ - 2 Score 0 0 1 0  Altered sleeping 0 - - 2  Tired, decreased energy 0 - - 1  Change in appetite 0 - - 1  Feeling bad or failure about yourself  0 - - 0  Trouble concentrating 0 - - 1  Moving slowly or fidgety/restless 0 - - 0  Suicidal thoughts 0 - - 0  PHQ-9 Score 0 - - 5  Difficult doing work/chores Not difficult at all - - Somewhat difficult   Anhedonia: {Blank single:19197::"yes","no"} Weight changes: {Blank single:19197::"yes","no"} Insomnia: {Blank single:19197::"yes","no"} {Blank single:19197::"hard to fall asleep","hard to stay asleep"}  Hypersomnia: {Blank single:19197::"yes","no"} Fatigue/loss of energy: {Blank single:19197::"yes","no"} Feelings of worthlessness: {Blank single:19197::"yes","no"} Feelings of guilt: {Blank single:19197::"yes","no"} Impaired concentration/indecisiveness: {Blank single:19197::"yes","no"} Suicidal ideations: {Blank single:19197::"yes","no"}   Crying spells: {Blank single:19197::"yes","no"} Recent Stressors/Life Changes: {Blank single:19197::"yes","no"}   Relationship problems: {Blank single:19197::"yes","no"}   Family stress: {Blank single:19197::"yes","no"}     Financial stress: {Blank single:19197::"yes","no"}    Job stress: {Blank single:19197::"yes","no"}    Recent death/loss: {Blank single:19197::"yes","no"}  Relevant past medical, surgical, family and social history reviewed and updated as indicated. Interim medical history since our last visit reviewed. Allergies and medications reviewed and updated.  Review of Systems  Per HPI unless specifically indicated above     Objective:    There were no vitals taken for this visit.  Wt Readings from Last 3 Encounters:  04/26/18 229 lb 6.4 oz (104.1 kg)  06/21/17 222 lb 8 oz (100.9 kg)  07/31/16 220 lb 1.6 oz (99.8 kg)    Physical Exam  Results for orders placed or performed in visit on 06/21/17  CBC with Differential/Platelet  Result Value Ref Range   WBC 3.9 3.4 - 10.8 x10E3/uL   RBC 4.35 4.14 - 5.80 x10E6/uL   Hemoglobin 14.2 13.0 - 17.7 g/dL   Hematocrit 41.9 37.5 - 51.0 %   MCV 96 79 - 97 fL   MCH 32.6 26.6 - 33.0 pg   MCHC 33.9 31.5 - 35.7 g/dL   RDW 14.0 12.3 - 15.4 %   Platelets 234 150 - 379 x10E3/uL   Neutrophils 39 Not Estab. %   Lymphs 41 Not Estab. %   Monocytes 6 Not Estab. %   Eos 13 Not Estab. %   Basos 1 Not Estab. %   Neutrophils Absolute 1.6 1.4 - 7.0 x10E3/uL   Lymphocytes Absolute 1.6 0.7 - 3.1 x10E3/uL   Monocytes Absolute 0.2 0.1 - 0.9 x10E3/uL   EOS (ABSOLUTE) 0.5 (H) 0.0 - 0.4 x10E3/uL   Basophils Absolute 0.0 0.0 - 0.2 x10E3/uL   Immature Granulocytes 0 Not Estab. %   Immature Grans (Abs) 0.0 0.0 - 0.1 x10E3/uL  Comprehensive metabolic panel  Result Value Ref Range   Glucose 99 65 - 99 mg/dL   BUN 16 6 - 24 mg/dL  Creatinine, Ser 1.19 0.76 - 1.27 mg/dL   GFR calc non Af Amer 74 >59 mL/min/1.73   GFR calc Af Amer 85 >59  mL/min/1.73   BUN/Creatinine Ratio 13 9 - 20   Sodium 143 134 - 144 mmol/L   Potassium 4.5 3.5 - 5.2 mmol/L   Chloride 106 96 - 106 mmol/L   CO2 24 20 - 29 mmol/L   Calcium 9.4 8.7 - 10.2 mg/dL   Total Protein 6.8 6.0 - 8.5 g/dL   Albumin 4.4 3.5 - 5.5 g/dL   Globulin, Total 2.4 1.5 - 4.5 g/dL   Albumin/Globulin Ratio 1.8 1.2 - 2.2   Bilirubin Total 0.4 0.0 - 1.2 mg/dL   Alkaline Phosphatase 67 39 - 117 IU/L   AST 13 0 - 40 IU/L   ALT 13 0 - 44 IU/L  Lipid Panel w/o Chol/HDL Ratio  Result Value Ref Range   Cholesterol, Total 207 (H) 100 - 199 mg/dL   Triglycerides 138 0 - 149 mg/dL   HDL 42 >39 mg/dL   VLDL Cholesterol Cal 28 5 - 40 mg/dL   LDL Calculated 137 (H) 0 - 99 mg/dL  TSH  Result Value Ref Range   TSH 1.810 0.450 - 4.500 uIU/mL  UA/M w/rflx Culture, Routine  Result Value Ref Range   Specific Gravity, UA 1.015 1.005 - 1.030   pH, UA 6.0 5.0 - 7.5   Color, UA Yellow Yellow   Appearance Ur Clear Clear   Leukocytes, UA Negative Negative   Protein, UA Negative Negative/Trace   Glucose, UA Negative Negative   Ketones, UA Negative Negative   RBC, UA Negative Negative   Bilirubin, UA Negative Negative   Urobilinogen, Ur 0.2 0.2 - 1.0 mg/dL   Nitrite, UA Negative Negative  HIV antibody  Result Value Ref Range   HIV Screen 4th Generation wRfx Non Reactive Non Reactive      Assessment & Plan:   Problem List Items Addressed This Visit      Other   Anxiety disorder - Primary       Follow up plan: No follow-ups on file.

## 2018-06-23 ENCOUNTER — Encounter: Payer: BLUE CROSS/BLUE SHIELD | Admitting: Family Medicine

## 2018-07-20 ENCOUNTER — Other Ambulatory Visit: Payer: Self-pay | Admitting: Family Medicine

## 2018-07-20 NOTE — Telephone Encounter (Signed)
Please schedule virtual visit

## 2018-07-20 NOTE — Telephone Encounter (Signed)
No follow up scheduled please advise on refill

## 2018-07-21 ENCOUNTER — Ambulatory Visit (INDEPENDENT_AMBULATORY_CARE_PROVIDER_SITE_OTHER): Payer: BC Managed Care – PPO | Admitting: Family Medicine

## 2018-07-21 ENCOUNTER — Other Ambulatory Visit: Payer: Self-pay

## 2018-07-21 ENCOUNTER — Encounter: Payer: Self-pay | Admitting: Family Medicine

## 2018-07-21 DIAGNOSIS — F411 Generalized anxiety disorder: Secondary | ICD-10-CM | POA: Diagnosis not present

## 2018-07-21 DIAGNOSIS — J4520 Mild intermittent asthma, uncomplicated: Secondary | ICD-10-CM | POA: Diagnosis not present

## 2018-07-21 MED ORDER — BUSPIRONE HCL 10 MG PO TABS: 10.0000 mg | ORAL_TABLET | Freq: Three times a day (TID) | ORAL | 1 refills | Status: DC

## 2018-07-21 MED ORDER — FLUOXETINE HCL 20 MG PO CAPS: 20.0000 mg | ORAL_CAPSULE | Freq: Every day | ORAL | 1 refills | Status: DC

## 2018-07-21 MED ORDER — ALBUTEROL SULFATE HFA 108 (90 BASE) MCG/ACT IN AERS: INHALATION_SPRAY | RESPIRATORY_TRACT | 6 refills | Status: DC

## 2018-07-21 MED ORDER — BUDESONIDE-FORMOTEROL FUMARATE 160-4.5 MCG/ACT IN AERO: 2.0000 | INHALATION_SPRAY | Freq: Two times a day (BID) | RESPIRATORY_TRACT | 3 refills | Status: DC

## 2018-07-21 NOTE — Progress Notes (Signed)
There were no vitals taken for this visit.   Subjective:    Patient ID: Christopher Davenport, male    DOB: 11/10/1975, 43 y.o.   MRN: [id number]  HPI: Christopher Davenport is a 43 y.o. male  Chief Complaint  Patient presents with  . Medication Refill    Prozac would like capsules   ANXIETY/STRESS Duration:stable Anxious mood: yes  Excessive worrying: no Irritability: no  Sweating: no Nausea: no Palpitations:no Hyperventilation: no Panic attacks: no Agoraphobia: no  Obscessions/compulsions: no Depressed mood: no Depression screen [facility] 2/9 07/21/2018 04/26/2018 07/31/2016 05/17/2015 11/16/2014  Decreased Interest 0 0 0 0 0  Down, Depressed, Hopeless 0 0 0 1 0  PHQ - 2 Score 0 0 0 1 0  Altered sleeping 0 0 - - 2  Tired, decreased energy 1 0 - - 1  Change in appetite 0 0 - - 1  Feeling bad or failure about yourself  0 0 - - 0  Trouble concentrating 1 0 - - 1  Moving slowly or fidgety/restless 0 0 - - 0  Suicidal thoughts 0 0 - - 0  PHQ-9 Score 2 0 - - 5  Difficult doing work/chores - Not difficult at all - - Somewhat difficult   GAD 7 : Generalized Anxiety Score 07/21/2018 05/17/2015  Nervous, Anxious, on Edge 2 3  Control/stop worrying 0 1  Worry too much - different things 0 3  Trouble relaxing 0 2  Restless 0 1  Easily annoyed or irritable 2 2  Afraid - awful might happen 0 0  Total GAD 7 Score 4 12  Anxiety Difficulty Not difficult at all Somewhat difficult   Anhedonia: no Weight changes: no Insomnia: no   Hypersomnia: no Fatigue/loss of energy: no Feelings of worthlessness: no Feelings of guilt: no Impaired concentration/indecisiveness: yes Suicidal ideations: no  Crying spells: no Recent Stressors/Life Changes: yes   Relationship problems: no   Family stress: no     Financial stress: no    Job stress: yes    Recent death/loss: no   Relevant past medical, surgical, family and social history reviewed and updated as indicated. Interim medical history  since our last visit reviewed. Allergies and medications reviewed and updated.  Review of Systems  Constitutional: Negative.   Respiratory: Negative.   Cardiovascular: Negative.   Musculoskeletal: Negative.   Neurological: Negative.   Psychiatric/Behavioral: Negative.     Per HPI unless specifically indicated above     Objective:    There were no vitals taken for this visit.  Wt Readings from Last 3 Encounters:  04/26/18 229 lb 6.4 oz (104.1 kg)  06/21/17 222 lb 8 oz (100.9 kg)  07/31/16 220 lb 1.6 oz (99.8 kg)    Physical Exam Vitals signs and nursing note reviewed.  Pulmonary:     Effort: Pulmonary effort is normal. No respiratory distress.     Comments: Speaking in full sentences Neurological:     Mental Status: He is alert.  Psychiatric:        Mood and Affect: Mood normal.        Behavior: Behavior normal.        Thought Content: Thought content normal.        Judgment: Judgment normal.     Results for orders placed or performed in visit on 06/21/17  CBC with Differential/Platelet  Result Value Ref Range   WBC 3.9 3.4 - 10.8 x10E3/uL   RBC 4.35 4.14 - 5.80 x10E6/uL   Hemoglobin 14.2  13.0 - 17.7 g/dL   Hematocrit 41.9 37.5 - 51.0 %   MCV 96 79 - 97 fL   MCH 32.6 26.6 - 33.0 pg   MCHC 33.9 31.5 - 35.7 g/dL   RDW 14.0 12.3 - 15.4 %   Platelets 234 150 - 379 x10E3/uL   Neutrophils 39 Not Estab. %   Lymphs 41 Not Estab. %   Monocytes 6 Not Estab. %   Eos 13 Not Estab. %   Basos 1 Not Estab. %   Neutrophils Absolute 1.6 1.4 - 7.0 x10E3/uL   Lymphocytes Absolute 1.6 0.7 - 3.1 x10E3/uL   Monocytes Absolute 0.2 0.1 - 0.9 x10E3/uL   EOS (ABSOLUTE) 0.5 (H) 0.0 - 0.4 x10E3/uL   Basophils Absolute 0.0 0.0 - 0.2 x10E3/uL   Immature Granulocytes 0 Not Estab. %   Immature Grans (Abs) 0.0 0.0 - 0.1 x10E3/uL  Comprehensive metabolic panel  Result Value Ref Range   Glucose 99 65 - 99 mg/dL   BUN 16 6 - 24 mg/dL   Creatinine, Ser 1.19 0.76 - 1.27 mg/dL   GFR calc  non Af Amer 74 >59 mL/min/1.73   GFR calc Af Amer 85 >59 mL/min/1.73   BUN/Creatinine Ratio 13 9 - 20   Sodium 143 134 - 144 mmol/L   Potassium 4.5 3.5 - 5.2 mmol/L   Chloride 106 96 - 106 mmol/L   CO2 24 20 - 29 mmol/L   Calcium 9.4 8.7 - 10.2 mg/dL   Total Protein 6.8 6.0 - 8.5 g/dL   Albumin 4.4 3.5 - 5.5 g/dL   Globulin, Total 2.4 1.5 - 4.5 g/dL   Albumin/Globulin Ratio 1.8 1.2 - 2.2   Bilirubin Total 0.4 0.0 - 1.2 mg/dL   Alkaline Phosphatase 67 39 - 117 IU/L   AST 13 0 - 40 IU/L   ALT 13 0 - 44 IU/L  Lipid Panel w/o Chol/HDL Ratio  Result Value Ref Range   Cholesterol, Total 207 (H) 100 - 199 mg/dL   Triglycerides 138 0 - 149 mg/dL   HDL 42 >39 mg/dL   VLDL Cholesterol Cal 28 5 - 40 mg/dL   LDL Calculated 137 (H) 0 - 99 mg/dL  TSH  Result Value Ref Range   TSH 1.810 0.450 - 4.500 uIU/mL  UA/M w/rflx Culture, Routine  Result Value Ref Range   Specific Gravity, UA 1.015 1.005 - 1.030   pH, UA 6.0 5.0 - 7.5   Color, UA Yellow Yellow   Appearance Ur Clear Clear   Leukocytes, UA Negative Negative   Protein, UA Negative Negative/Trace   Glucose, UA Negative Negative   Ketones, UA Negative Negative   RBC, UA Negative Negative   Bilirubin, UA Negative Negative   Urobilinogen, Ur 0.2 0.2 - 1.0 mg/dL   Nitrite, UA Negative Negative  HIV antibody  Result Value Ref Range   HIV Screen 4th Generation wRfx Non Reactive Non Reactive      Assessment & Plan:   Problem List Items Addressed This Visit      Respiratory   Asthma    Under good control on current regimen. Continue current regimen. Continue to monitor. Call with any concerns. Refills given.        Relevant Medications   albuterol (PROAIR HFA) 108 (90 Base) MCG/ACT inhaler   budesonide-formoterol (SYMBICORT) 160-4.5 MCG/ACT inhaler     Other   Anxiety disorder - Primary    Stable on current regimen. Continue current regimen. Continue to monitor. Refills given today.  Relevant Medications   busPIRone  (BUSPAR) 10 MG tablet   FLUoxetine (PROZAC) 20 MG capsule       Follow up plan: Return in about 6 months (around 01/20/2019) for Physical.   . This visit was completed via telephone due to the restrictions of the COVID-19 pandemic. All issues as above were discussed and addressed but no physical exam was performed. If it was felt that the patient should be evaluated in the office, they were directed there. The patient verbally consented to this visit. Patient was unable to complete an audio/visual visit due to Lack of equipment. Due to the catastrophic nature of the COVID-19 pandemic, this visit was done through audio contact only. . Location of the patient: home . Location of the provider: home . Those involved with this call:  . Provider: Park Liter, DO . CMA: Gerda Diss, CMA . Front Desk/Registration: Don Perking  . Time spent on call: 15 minutes with patient face to face via video conference. More than 50% of this time was spent in counseling and coordination of care. 23 minutes total spent in review of patient's record and preparation of their chart.

## 2018-07-21 NOTE — Assessment & Plan Note (Signed)
Stable on current regimen. Continue current regimen. Continue to monitor. Refills given today.

## 2018-07-21 NOTE — Assessment & Plan Note (Signed)
Under good control on current regimen. Continue current regimen. Continue to monitor. Call with any concerns. Refills given.   

## 2018-09-18 DIAGNOSIS — G43909 Migraine, unspecified, not intractable, without status migrainosus: Secondary | ICD-10-CM | POA: Diagnosis not present

## 2018-09-18 DIAGNOSIS — R112 Nausea with vomiting, unspecified: Secondary | ICD-10-CM | POA: Diagnosis not present

## 2018-09-18 DIAGNOSIS — K760 Fatty (change of) liver, not elsewhere classified: Secondary | ICD-10-CM | POA: Diagnosis not present

## 2018-09-18 DIAGNOSIS — R1013 Epigastric pain: Secondary | ICD-10-CM | POA: Diagnosis not present

## 2018-09-19 DIAGNOSIS — R1115 Cyclical vomiting syndrome unrelated to migraine: Secondary | ICD-10-CM | POA: Diagnosis not present

## 2018-09-19 DIAGNOSIS — R112 Nausea with vomiting, unspecified: Secondary | ICD-10-CM | POA: Diagnosis not present

## 2018-09-19 DIAGNOSIS — R1013 Epigastric pain: Secondary | ICD-10-CM | POA: Diagnosis not present

## 2018-11-05 IMAGING — US US ART/VEN ABD/PELV/SCROTUM DOPPLER LTD
1 series · 14 of 25 positions shown · non-contrast
Comparison: None.

CLINICAL DATA: Right testicular mass felt for the past 2 weeks.

EXAM:
SCROTAL ULTRASOUND
DOPPLER ULTRASOUND OF THE TESTICLES
TECHNIQUE: Complete ultrasound examination of the testicles, epididymis, and
other scrotal structures was performed. Color and spectral Doppler
ultrasound were also utilized to evaluate blood flow to the
testicles.

[Series 1: us art/ven abd/pelv/scrotum doppler ltd · 0.08mm/px · 14 of 53 slices shown]
[im 1/53]
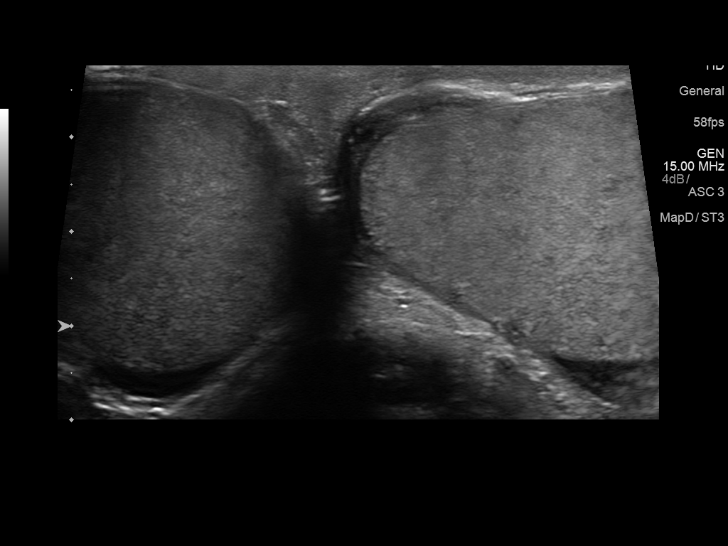
[im 5/53]
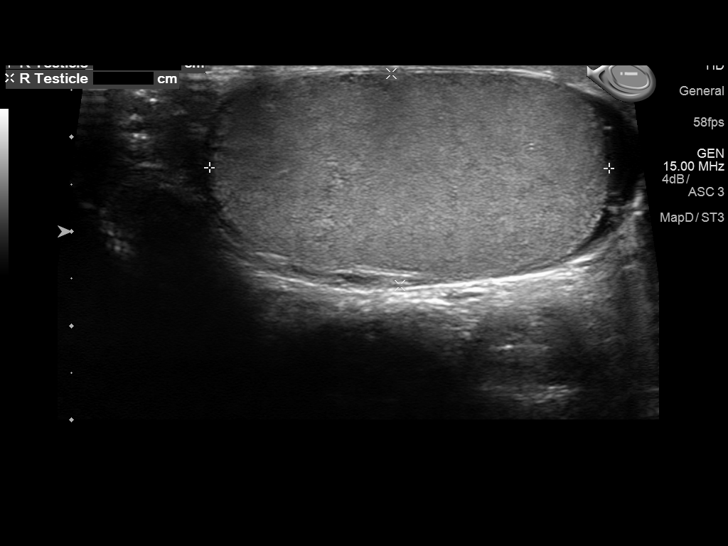
[im 9/53]
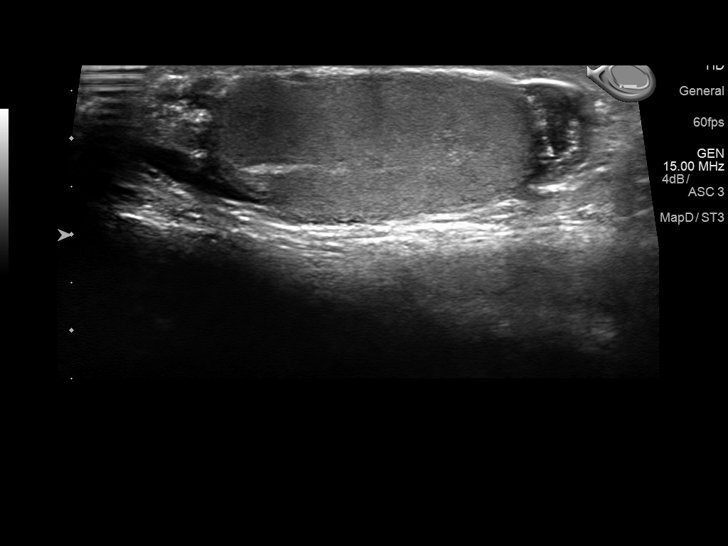
[im 14/53]
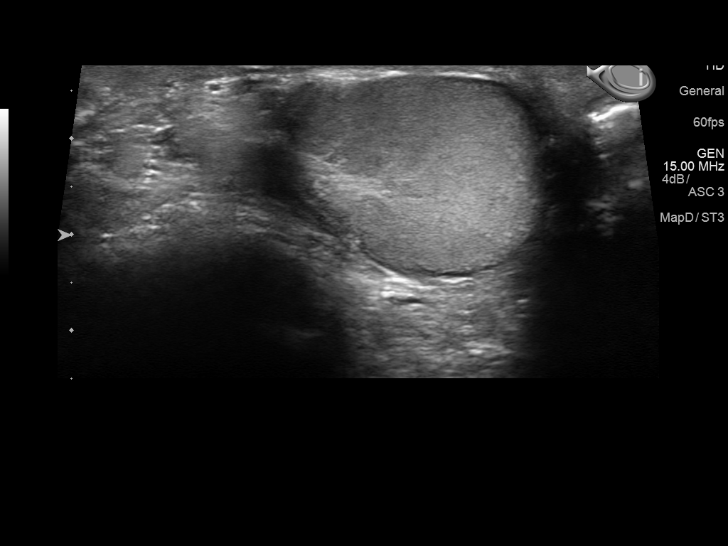
[im 18/53]
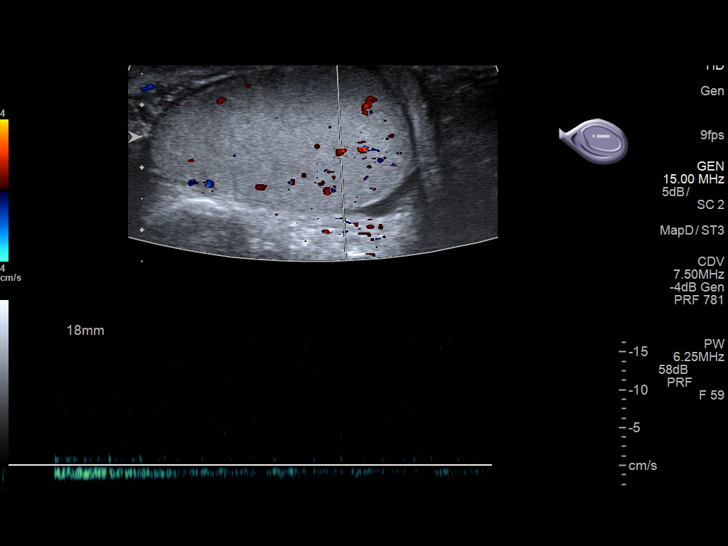
[im 20/53]
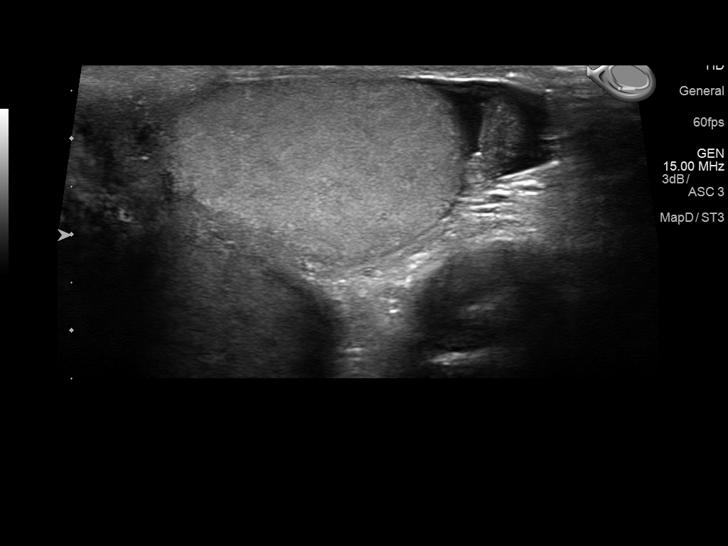
[im 24/53]
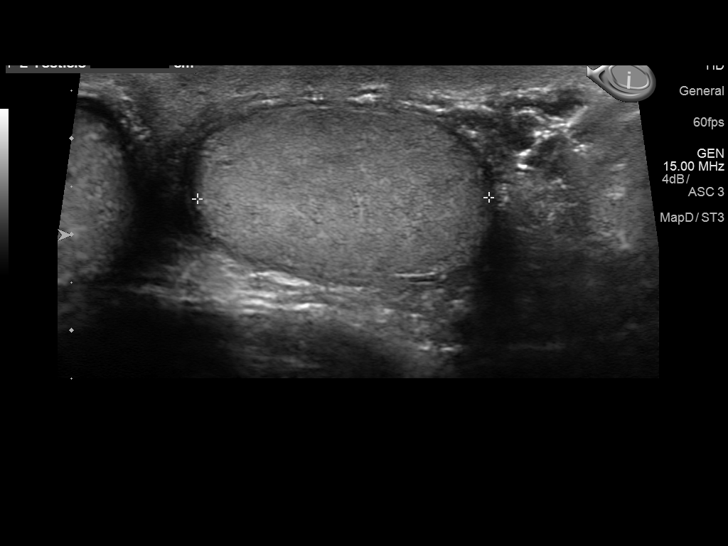
[im 29/53]
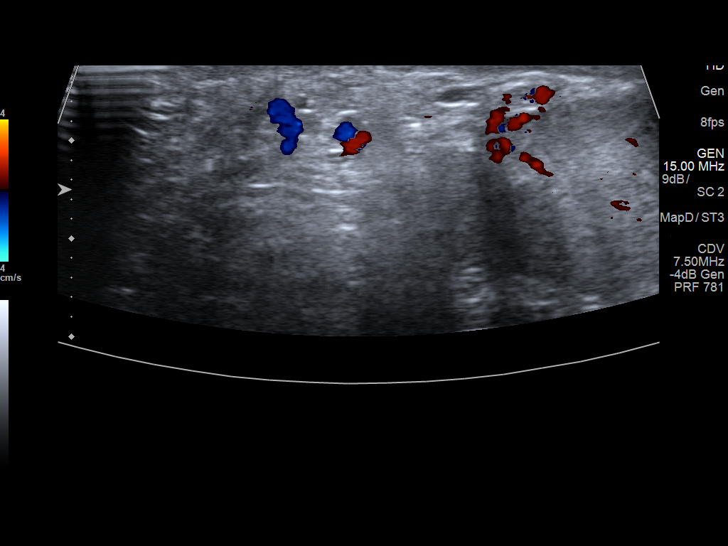
[im 33/53]
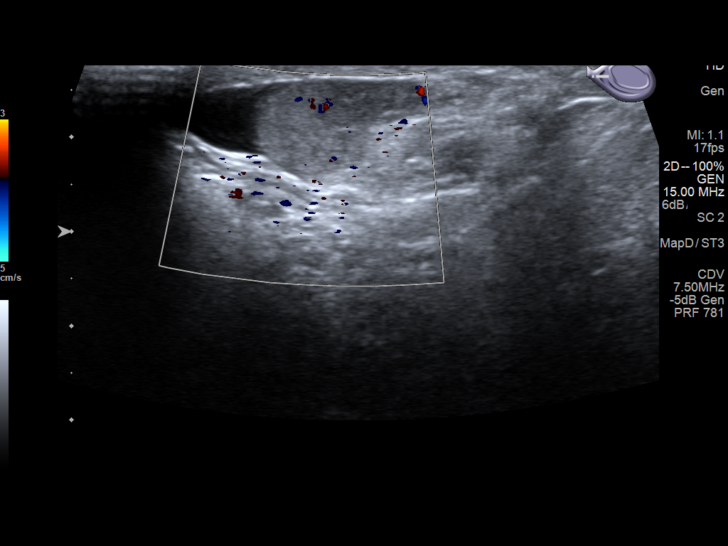
[im 35/53]
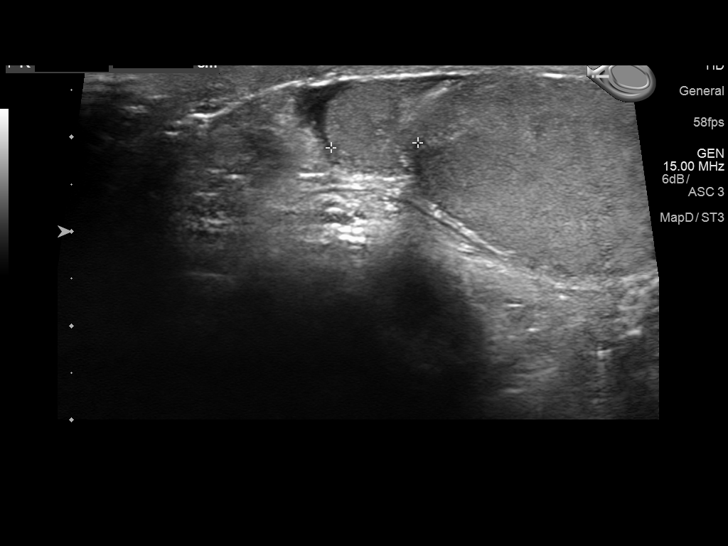
[im 40/53]
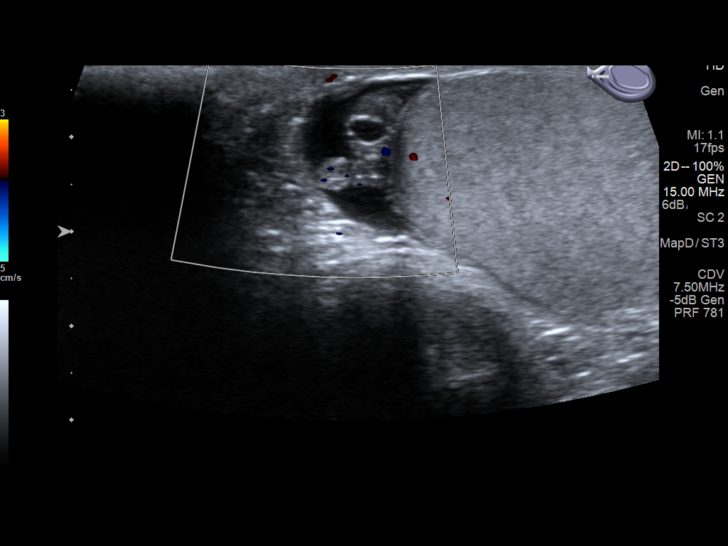
[im 44/53]
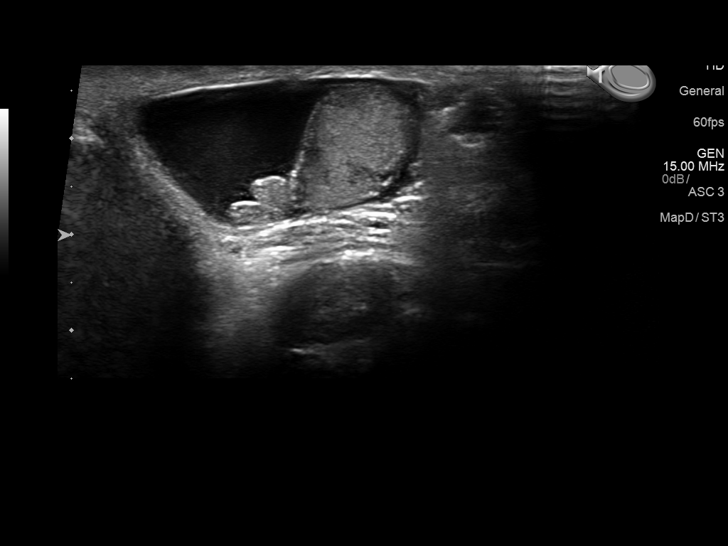
[im 48/53]
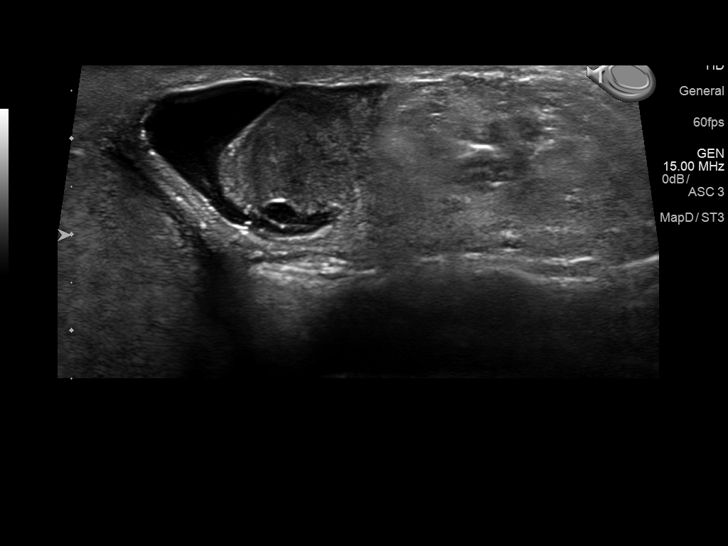
[im 53/53]
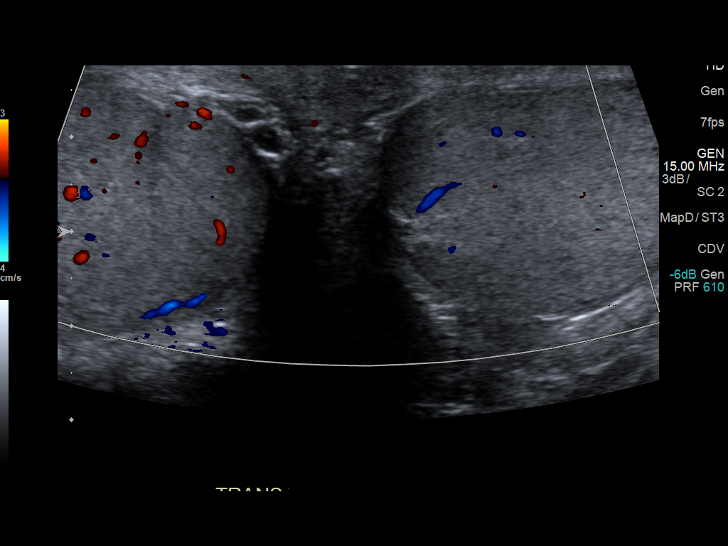

[14 of 25 positions shown; findings below may reference images not displayed]

FINDINGS: Right testicle

Measurements: 4.2 x 3.7 x 2.3 cm. No mass or microlithiasis
visualized.

Left testicle

Measurements: 4.6 x 3.0 x 2.6 cm. No mass or microlithiasis
visualized.

Right epididymis: 4 mm cyst in the head of the epididymis.
Otherwise, normal.

Left epididymis: 3 mm cyst in the head of the epididymis. Otherwise,
normal.

Hydrocele:  Small left hydrocele.

Varicocele:  None visualized.

Pulsed Doppler interrogation of both testes demonstrates normal low
resistance arterial and venous waveforms bilaterally.
IMPRESSION: 1. Small bilateral epididymal cysts.
2. Small left hydrocele.
3. No mass seen.

## 2019-01-12 ENCOUNTER — Other Ambulatory Visit: Payer: Self-pay | Admitting: Family Medicine

## 2019-01-20 ENCOUNTER — Encounter: Payer: Self-pay | Admitting: Family Medicine

## 2019-02-21 ENCOUNTER — Other Ambulatory Visit: Payer: Self-pay

## 2019-02-24 ENCOUNTER — Ambulatory Visit (INDEPENDENT_AMBULATORY_CARE_PROVIDER_SITE_OTHER): Payer: BC Managed Care – PPO | Admitting: Family Medicine

## 2019-02-24 ENCOUNTER — Other Ambulatory Visit: Payer: Self-pay

## 2019-02-24 ENCOUNTER — Encounter: Payer: Self-pay | Admitting: Family Medicine

## 2019-02-24 VITALS — BP 122/85 | HR 68 | Temp 99.2°F | Ht 73.11 in | Wt 239.4 lb

## 2019-02-24 DIAGNOSIS — F411 Generalized anxiety disorder: Secondary | ICD-10-CM | POA: Diagnosis not present

## 2019-02-24 DIAGNOSIS — J4520 Mild intermittent asthma, uncomplicated: Secondary | ICD-10-CM | POA: Diagnosis not present

## 2019-02-24 DIAGNOSIS — Z23 Encounter for immunization: Secondary | ICD-10-CM

## 2019-02-24 DIAGNOSIS — Z0000 Encounter for general adult medical examination without abnormal findings: Secondary | ICD-10-CM | POA: Diagnosis not present

## 2019-02-24 DIAGNOSIS — Z8709 Personal history of other diseases of the respiratory system: Secondary | ICD-10-CM

## 2019-02-24 DIAGNOSIS — Z8639 Personal history of other endocrine, nutritional and metabolic disease: Secondary | ICD-10-CM | POA: Diagnosis not present

## 2019-02-24 LAB — UA/M W/RFLX CULTURE, ROUTINE
Bilirubin, UA: NEGATIVE
Glucose, UA: NEGATIVE
Ketones, UA: NEGATIVE
Leukocytes,UA: NEGATIVE
Nitrite, UA: NEGATIVE
Protein,UA: NEGATIVE
RBC, UA: NEGATIVE
Specific Gravity, UA: 1.02 (ref 1.005–1.030)
Urobilinogen, Ur: 0.2 mg/dL (ref 0.2–1.0)
pH, UA: 5.5 (ref 5.0–7.5)

## 2019-02-24 LAB — BAYER DCA HB A1C WAIVED: HB A1C (BAYER DCA - WAIVED): 4.9 % (ref <7.0)

## 2019-02-24 MED ORDER — FLUOXETINE HCL 20 MG PO CAPS: ORAL_CAPSULE | ORAL | 1 refills | Status: DC

## 2019-02-24 MED ORDER — BUDESONIDE-FORMOTEROL FUMARATE 160-4.5 MCG/ACT IN AERO: 2.0000 | INHALATION_SPRAY | Freq: Two times a day (BID) | RESPIRATORY_TRACT | 3 refills | Status: DC

## 2019-02-24 MED ORDER — BUSPIRONE HCL 10 MG PO TABS: 10.0000 mg | ORAL_TABLET | Freq: Three times a day (TID) | ORAL | 1 refills | Status: DC

## 2019-02-24 MED ORDER — ALBUTEROL SULFATE HFA 108 (90 BASE) MCG/ACT IN AERS: INHALATION_SPRAY | RESPIRATORY_TRACT | 6 refills | Status: DC

## 2019-02-24 NOTE — Assessment & Plan Note (Signed)
Under good control on current regimen. Continue current regimen. Continue to monitor. Call with any concerns. Refills given.   

## 2019-02-24 NOTE — Patient Instructions (Signed)

## 2019-02-24 NOTE — Assessment & Plan Note (Signed)
Under good control on current regimen. Continue current regimen. Continue to monitor. Call with any concerns. Refills given. Labs drawn today.   

## 2019-02-24 NOTE — Progress Notes (Signed)
BP 122/85   Pulse 68   Temp 99.2 F (37.3 C)   Ht 6' 1.11" (1.857 m)   Wt 239 lb 6 oz (108.6 kg)   SpO2 97%   BMI 31.49 kg/m    Subjective:    Patient ID: Christopher Davenport, male    DOB: [birth year], 43 y.o.   MRN: [id number]  HPI: Christopher Davenport is a 43 y.o. male presenting on 02/24/2019 for comprehensive medical examination. Current medical complaints include:  ANXIETY/STRESS Duration:controlled Anxious mood: no  Excessive worrying: no Irritability: no  Sweating: no Nausea: no Palpitations:no Hyperventilation: no Panic attacks: no Agoraphobia: no  Obscessions/compulsions: no Depressed mood: no Depression screen [facility] 2/9 02/24/2019 07/21/2018 04/26/2018 07/31/2016 05/17/2015  Decreased Interest 0 0 0 0 0  Down, Depressed, Hopeless 0 0 0 0 1  PHQ - 2 Score 0 0 0 0 1  Altered sleeping 2 0 0 - -  Tired, decreased energy 1 1 0 - -  Change in appetite 1 0 0 - -  Feeling bad or failure about yourself  0 0 0 - -  Trouble concentrating 1 1 0 - -  Moving slowly or fidgety/restless 0 0 0 - -  Suicidal thoughts 0 0 0 - -  PHQ-9 Score 5 2 0 - -  Difficult doing work/chores Not difficult at all - Not difficult at all - -   Anhedonia: no Weight changes: no Insomnia: no   Hypersomnia: no Fatigue/loss of energy: no Feelings of worthlessness: no Feelings of guilt: no Impaired concentration/indecisiveness: no Suicidal ideations: no  Crying spells: no Recent Stressors/Life Changes: yes   Relationship problems: no   Family stress: yes     Financial stress: no    Job stress: yes    Recent death/loss: no  ASTHMA Asthma status: controlled Satisfied with current treatment?: yes Albuterol/rescue inhaler frequency: with exercise and cold Dyspnea frequency: with change in the weather Wheezing frequency: with exercise and cold Cough frequency: none Nocturnal symptom frequency:  Limitation of activity: no Current upper respiratory symptoms: no Triggers: cold air and  exercise Aerochamber/spacer use: no Visits to ER or Urgent Care in past year: no Pneumovax: Up to Date Influenza: Up to Date  He currently lives with: wife and kids Interim Problems from his last visit: no  Depression Screen done today and results listed below:  Depression screen [facility] 2/9 02/24/2019 07/21/2018 04/26/2018 07/31/2016 05/17/2015  Decreased Interest 0 0 0 0 0  Down, Depressed, Hopeless 0 0 0 0 1  PHQ - 2 Score 0 0 0 0 1  Altered sleeping 2 0 0 - -  Tired, decreased energy 1 1 0 - -  Change in appetite 1 0 0 - -  Feeling bad or failure about yourself  0 0 0 - -  Trouble concentrating 1 1 0 - -  Moving slowly or fidgety/restless 0 0 0 - -  Suicidal thoughts 0 0 0 - -  PHQ-9 Score 5 2 0 - -  Difficult doing work/chores Not difficult at all - Not difficult at all - -    Past Medical History:  Past Medical History:  Diagnosis Date  . Anxiety   . Asthma   . GERD (gastroesophageal reflux disease)     Surgical History:  History reviewed. No pertinent surgical history.  Medications:  Current Outpatient Medications on File Prior to Visit  Medication Sig  . b complex vitamins tablet Take 1 tablet by mouth daily.  . Multiple Vitamin (MULTIVITAMIN  WITH MINERALS) TABS tablet Take 1 tablet by mouth daily.  . vitamin C (ASCORBIC ACID) 500 MG tablet Take 500 mg by mouth daily.   No current facility-administered medications on file prior to visit.     Allergies:  No Known Allergies  Social History:  Social History   Socioeconomic History  . Marital status: Married    Spouse name: Not on file  . Number of children: Not on file  . Years of education: Not on file  . Highest education level: Not on file  Occupational History  . Not on file  Social Needs  . Financial resource strain: Not on file  . Food insecurity    Worry: Not on file    Inability: Not on file  . Transportation needs    Medical: Not on file    Non-medical: Not on file  Tobacco Use  . Smoking  status: Former Smoker    Quit date: 03/23/2012    Years since quitting: 6.9  . Smokeless tobacco: Never Used  Substance and Sexual Activity  . Alcohol use: Yes    Alcohol/week: 0.0 standard drinks    Comment: 2 during the week  and 3-4 on weekends  . Drug use: No  . Sexual activity: Yes  Lifestyle  . Physical activity    Days per week: Not on file    Minutes per session: Not on file  . Stress: Not on file  Relationships  . Social Herbalist on phone: Not on file    Gets together: Not on file    Attends religious service: Not on file    Active member of club or organization: Not on file    Attends meetings of clubs or organizations: Not on file    Relationship status: Not on file  . Intimate partner violence    Fear of current or ex partner: Not on file    Emotionally abused: Not on file    Physically abused: Not on file    Forced sexual activity: Not on file  Other Topics Concern  . Not on file  Social History Narrative  . Not on file   Social History   Tobacco Use  Smoking Status Former Smoker  . Quit date: 03/23/2012  . Years since quitting: 6.9  Smokeless Tobacco Never Used   Social History   Substance and Sexual Activity  Alcohol Use Yes  . Alcohol/week: 0.0 standard drinks   Comment: 2 during the week  and 3-4 on weekends    Family History:  Family History  Problem Relation Age of Onset  . Diabetes Mother   . Kidney disease Mother   . Heart disease Mother   . Hypertension Mother   . Hyperlipidemia Mother   . Dementia Maternal Grandmother   . Diabetes Maternal Grandfather   . Diabetes Paternal Grandmother     Past medical history, surgical history, medications, allergies, family history and social history reviewed with patient today and changes made to appropriate areas of the chart.   Review of Systems  Constitutional: Negative.   HENT: Negative.   Eyes: Negative.   Respiratory: Positive for wheezing. Negative for cough, hemoptysis,  sputum production and shortness of breath.   Cardiovascular: Negative.   Gastrointestinal: Negative.   Genitourinary: Negative.   Musculoskeletal: Negative.   Skin: Negative.   Neurological: Negative.   Endo/Heme/Allergies: Negative.   Psychiatric/Behavioral: Negative for depression, hallucinations, memory loss, substance abuse and suicidal ideas. The patient is nervous/anxious. The patient does  not have insomnia.     All other ROS negative except what is listed above and in the HPI.      Objective:    BP 122/85   Pulse 68   Temp 99.2 F (37.3 C)   Ht 6' 1.11" (1.857 m)   Wt 239 lb 6 oz (108.6 kg)   SpO2 97%   BMI 31.49 kg/m   Wt Readings from Last 3 Encounters:  02/24/19 239 lb 6 oz (108.6 kg)  04/26/18 229 lb 6.4 oz (104.1 kg)  06/21/17 222 lb 8 oz (100.9 kg)    Physical Exam Vitals signs and nursing note reviewed.  Constitutional:      General: He is not in acute distress.    Appearance: Normal appearance. He is obese. He is not ill-appearing, toxic-appearing or diaphoretic.  HENT:     Head: Normocephalic and atraumatic.     Right Ear: Tympanic membrane, ear canal and external ear normal. There is no impacted cerumen.     Left Ear: Tympanic membrane, ear canal and external ear normal. There is no impacted cerumen.     Nose: Nose normal. No congestion or rhinorrhea.     Mouth/Throat:     Mouth: Mucous membranes are moist.     Pharynx: Oropharynx is clear. No oropharyngeal exudate or posterior oropharyngeal erythema.  Eyes:     General: No scleral icterus.       Right eye: No discharge.        Left eye: No discharge.     Extraocular Movements: Extraocular movements intact.     Conjunctiva/sclera: Conjunctivae normal.     Pupils: Pupils are equal, round, and reactive to light.  Neck:     Musculoskeletal: Normal range of motion and neck supple. No neck rigidity or muscular tenderness.     Vascular: No carotid bruit.  Cardiovascular:     Rate and Rhythm: Normal  rate and regular rhythm.     Pulses: Normal pulses.     Heart sounds: No murmur. No friction rub. No gallop.   Pulmonary:     Effort: Pulmonary effort is normal. No respiratory distress.     Breath sounds: Normal breath sounds. No stridor. No wheezing, rhonchi or rales.  Chest:     Chest wall: No tenderness.  Abdominal:     General: Abdomen is flat. Bowel sounds are normal. There is no distension.     Palpations: Abdomen is soft. There is no mass.     Tenderness: There is no abdominal tenderness. There is no right CVA tenderness, left CVA tenderness, guarding or rebound.     Hernia: No hernia is present.  Genitourinary:    Comments: Genital exam deferred with shared decision making Musculoskeletal:        General: No swelling, tenderness, deformity or signs of injury.     Right lower leg: No edema.     Left lower leg: No edema.  Lymphadenopathy:     Cervical: No cervical adenopathy.  Skin:    General: Skin is warm and dry.     Capillary Refill: Capillary refill takes less than 2 seconds.     Coloration: Skin is not jaundiced or pale.     Findings: No bruising, erythema, lesion or rash.  Neurological:     General: No focal deficit present.     Mental Status: He is alert and oriented to person, place, and time.     Cranial Nerves: No cranial nerve deficit.     Sensory: No  sensory deficit.     Motor: No weakness.     Coordination: Coordination normal.     Gait: Gait normal.     Deep Tendon Reflexes: Reflexes normal.  Psychiatric:        Mood and Affect: Mood normal.        Behavior: Behavior normal.        Thought Content: Thought content normal.        Judgment: Judgment normal.     Results for orders placed or performed in visit on 06/21/17  CBC with Differential/Platelet  Result Value Ref Range   WBC 3.9 3.4 - 10.8 x10E3/uL   RBC 4.35 4.14 - 5.80 x10E6/uL   Hemoglobin 14.2 13.0 - 17.7 g/dL   Hematocrit 41.9 37.5 - 51.0 %   MCV 96 79 - 97 fL   MCH 32.6 26.6 - 33.0 pg    MCHC 33.9 31.5 - 35.7 g/dL   RDW 14.0 12.3 - 15.4 %   Platelets 234 150 - 379 x10E3/uL   Neutrophils 39 Not Estab. %   Lymphs 41 Not Estab. %   Monocytes 6 Not Estab. %   Eos 13 Not Estab. %   Basos 1 Not Estab. %   Neutrophils Absolute 1.6 1.4 - 7.0 x10E3/uL   Lymphocytes Absolute 1.6 0.7 - 3.1 x10E3/uL   Monocytes Absolute 0.2 0.1 - 0.9 x10E3/uL   EOS (ABSOLUTE) 0.5 (H) 0.0 - 0.4 x10E3/uL   Basophils Absolute 0.0 0.0 - 0.2 x10E3/uL   Immature Granulocytes 0 Not Estab. %   Immature Grans (Abs) 0.0 0.0 - 0.1 x10E3/uL  Comprehensive metabolic panel  Result Value Ref Range   Glucose 99 65 - 99 mg/dL   BUN 16 6 - 24 mg/dL   Creatinine, Ser 1.19 0.76 - 1.27 mg/dL   GFR calc non Af Amer 74 >59 mL/min/1.73   GFR calc Af Amer 85 >59 mL/min/1.73   BUN/Creatinine Ratio 13 9 - 20   Sodium 143 134 - 144 mmol/L   Potassium 4.5 3.5 - 5.2 mmol/L   Chloride 106 96 - 106 mmol/L   CO2 24 20 - 29 mmol/L   Calcium 9.4 8.7 - 10.2 mg/dL   Total Protein 6.8 6.0 - 8.5 g/dL   Albumin 4.4 3.5 - 5.5 g/dL   Globulin, Total 2.4 1.5 - 4.5 g/dL   Albumin/Globulin Ratio 1.8 1.2 - 2.2   Bilirubin Total 0.4 0.0 - 1.2 mg/dL   Alkaline Phosphatase 67 39 - 117 IU/L   AST 13 0 - 40 IU/L   ALT 13 0 - 44 IU/L  Lipid Panel w/o Chol/HDL Ratio  Result Value Ref Range   Cholesterol, Total 207 (H) 100 - 199 mg/dL   Triglycerides 138 0 - 149 mg/dL   HDL 42 >39 mg/dL   VLDL Cholesterol Cal 28 5 - 40 mg/dL   LDL Calculated 137 (H) 0 - 99 mg/dL  TSH  Result Value Ref Range   TSH 1.810 0.450 - 4.500 uIU/mL  UA/M w/rflx Culture, Routine   Specimen: Urine   URINE  Result Value Ref Range   Specific Gravity, UA 1.015 1.005 - 1.030   pH, UA 6.0 5.0 - 7.5   Color, UA Yellow Yellow   Appearance Ur Clear Clear   Leukocytes, UA Negative Negative   Protein, UA Negative Negative/Trace   Glucose, UA Negative Negative   Ketones, UA Negative Negative   RBC, UA Negative Negative   Bilirubin, UA Negative Negative    Urobilinogen, Ur 0.2  0.2 - 1.0 mg/dL   Nitrite, UA Negative Negative  HIV antibody  Result Value Ref Range   HIV Screen 4th Generation wRfx Non Reactive Non Reactive      Assessment & Plan:   Problem List Items Addressed This Visit      Respiratory   Asthma    Under good control on current regimen. Continue current regimen. Continue to monitor. Call with any concerns. Refills given.        Relevant Medications   budesonide-formoterol (SYMBICORT) 160-4.5 MCG/ACT inhaler   albuterol (PROAIR HFA) 108 (90 Base) MCG/ACT inhaler     Other   Anxiety disorder    Under good control on current regimen. Continue current regimen. Continue to monitor. Call with any concerns. Refills given. Labs drawn today.       Relevant Medications   FLUoxetine (PROZAC) 20 MG capsule   busPIRone (BUSPAR) 10 MG tablet    Other Visit Diagnoses    Routine general medical examination at a health care facility    -  Primary   Vaccines updated. Screening labs checked today. Continue diet and exercise. Call with any concerns.    Relevant Orders   Bayer DCA Hb A1c Waived   CBC with Differential OUT   Comp Met (CMET)   Lipid Panel w/o Chol/HDL Ratio OUT   TSH   UA/M w/rflx Culture, Routine   Immunization due       Flu shot given today.   Relevant Orders   Flu Vaccine QUAD 6+ mos PF IM (Fluarix Quad PF) (Completed)   Hx of upper respiratory infection       Sick in February. Would like to check antibodies. Lab ordered today. Await results.    Relevant Orders   SAR CoV2 Serology (COVID 19)AB(IGG)IA     LABORATORY TESTING:  Health maintenance labs ordered today as discussed above.   IMMUNIZATIONS:   - Tdap: Tetanus vaccination status reviewed: last tetanus booster within 10 years. - Influenza: Administered today - Pneumovax: Up to date  PATIENT COUNSELING:    Sexuality: Discussed sexually transmitted diseases, partner selection, use of condoms, avoidance of unintended pregnancy  and contraceptive  alternatives.   Advised to avoid cigarette smoking.  I discussed with the patient that most people either abstain from alcohol or drink within safe limits (<=14/week and <=4 drinks/occasion for males, <=7/weeks and <= 3 drinks/occasion for females) and that the risk for alcohol disorders and other health effects rises proportionally with the number of drinks per week and how often a drinker exceeds daily limits.  Discussed cessation/primary prevention of drug use and availability of treatment for abuse.   Diet: Encouraged to adjust caloric intake to maintain  or achieve ideal body weight, to reduce intake of dietary saturated fat and total fat, to limit sodium intake by avoiding high sodium foods and not adding table salt, and to maintain adequate dietary potassium and calcium preferably from fresh fruits, vegetables, and low-fat dairy products.    stressed the importance of regular exercise  Injury prevention: Discussed safety belts, safety helmets, smoke detector, smoking near bedding or upholstery.   Dental health: Discussed importance of regular tooth brushing, flossing, and dental visits.   Follow up plan: NEXT PREVENTATIVE PHYSICAL DUE IN 1 YEAR. Return in about 6 months (around 08/25/2019).

## 2019-02-26 LAB — CBC WITH DIFFERENTIAL/PLATELET
Basophils Absolute: 0.1 10*3/uL (ref 0.0–0.2)
Basos: 1 %
EOS (ABSOLUTE): 0.4 10*3/uL (ref 0.0–0.4)
Eos: 6 %
Hematocrit: 44.3 % (ref 37.5–51.0)
Hemoglobin: 15.1 g/dL (ref 13.0–17.7)
Immature Grans (Abs): 0 10*3/uL (ref 0.0–0.1)
Immature Granulocytes: 0 %
Lymphocytes Absolute: 2.1 10*3/uL (ref 0.7–3.1)
Lymphs: 33 %
MCH: 32.2 pg (ref 26.6–33.0)
MCHC: 34.1 g/dL (ref 31.5–35.7)
MCV: 95 fL (ref 79–97)
Monocytes Absolute: 0.4 10*3/uL (ref 0.1–0.9)
Monocytes: 7 %
Neutrophils Absolute: 3.4 10*3/uL (ref 1.4–7.0)
Neutrophils: 53 %
Platelets: 233 10*3/uL (ref 150–450)
RBC: 4.69 x10E6/uL (ref 4.14–5.80)
RDW: 13.1 % (ref 11.6–15.4)
WBC: 6.4 10*3/uL (ref 3.4–10.8)

## 2019-02-26 LAB — COMPREHENSIVE METABOLIC PANEL
ALT: 34 IU/L (ref 0–44)
AST: 23 IU/L (ref 0–40)
Albumin/Globulin Ratio: 1.6 (ref 1.2–2.2)
Albumin: 4.7 g/dL (ref 4.0–5.0)
Alkaline Phosphatase: 87 IU/L (ref 39–117)
BUN/Creatinine Ratio: 16 (ref 9–20)
BUN: 19 mg/dL (ref 6–24)
Bilirubin Total: 0.7 mg/dL (ref 0.0–1.2)
CO2: 23 mmol/L (ref 20–29)
Calcium: 9.7 mg/dL (ref 8.7–10.2)
Chloride: 100 mmol/L (ref 96–106)
Creatinine, Ser: 1.22 mg/dL (ref 0.76–1.27)
GFR calc Af Amer: 82 mL/min/{1.73_m2} (ref >59)
GFR calc non Af Amer: 71 mL/min/{1.73_m2} (ref >59)
Globulin, Total: 2.9 g/dL (ref 1.5–4.5)
Glucose: 95 mg/dL (ref 65–99)
Potassium: 4.3 mmol/L (ref 3.5–5.2)
Sodium: 138 mmol/L (ref 134–144)
Total Protein: 7.6 g/dL (ref 6.0–8.5)

## 2019-02-26 LAB — SAR COV2 SEROLOGY (COVID19)AB(IGG),IA

## 2019-02-26 LAB — EUROIMMUN SARS-COV-2 AB, IGG: Euroimmun SARS-CoV-2 Ab, IgG: NEGATIVE

## 2019-02-26 LAB — TSH: TSH: 2.61 u[IU]/mL (ref 0.450–4.500)

## 2019-02-26 LAB — LIPID PANEL W/O CHOL/HDL RATIO
Cholesterol, Total: 237 mg/dL — ABNORMAL HIGH (ref 100–199)
HDL: 49 mg/dL (ref >39)
LDL Chol Calc (NIH): 135 mg/dL — ABNORMAL HIGH (ref 0–99)
Triglycerides: 294 mg/dL — ABNORMAL HIGH (ref 0–149)
VLDL Cholesterol Cal: 53 mg/dL — ABNORMAL HIGH (ref 5–40)

## 2019-02-27 ENCOUNTER — Encounter: Payer: Self-pay | Admitting: Family Medicine

## 2019-12-11 ENCOUNTER — Telehealth: Payer: Self-pay

## 2019-12-11 DIAGNOSIS — J4520 Mild intermittent asthma, uncomplicated: Secondary | ICD-10-CM

## 2019-12-11 NOTE — Telephone Encounter (Signed)
Pa for Symbicort initiated and submitted via Cover My Meds. Key:  [id number]

## 2019-12-12 NOTE — Telephone Encounter (Signed)
Called and spoke with patient. Let him know that his PA was submitted yesterday and that we are just waiting on a determination from insurance. Will let him know as soon as we get something back.

## 2019-12-12 NOTE — Telephone Encounter (Signed)
budesonide-formoterol (SYMBICORT) 160-4.5 MCG/ACT inhaler Medication Date: 02/24/2019 Department: [place] Ordering/Authorizing: Valerie Roys, DO PT  does not have anymore of this med and shared with him that PA was done after lunch yesterday but he is requesting if anything further could be done to check on it today as he is very anxious w/o. FU can be done at [phone]

## 2019-12-13 NOTE — Telephone Encounter (Signed)
Kyung Bacca with [facility] called to advise that the PA was denied. Denial will be sent to office. [phone]

## 2019-12-13 NOTE — Telephone Encounter (Signed)
Awaiting denial form as to reasoning and if preferred alternatives are listed.

## 2019-12-14 NOTE — Telephone Encounter (Signed)
Received denial form. States that plan prefers Symbicort brand or Dulera brand, but that both will require PA. I called the pharmacy to see if they could run RX as generic and it is still not going through. Is there something we can do or something else we can send in? Would some kind of patient assistance be an option for this patient?

## 2019-12-14 NOTE — Telephone Encounter (Signed)
Referral for CCM placed today

## 2019-12-15 MED ORDER — FLUTICASONE-SALMETEROL 250-50 MCG/DOSE IN AEPB: 1.0000 | INHALATION_SPRAY | Freq: Two times a day (BID) | RESPIRATORY_TRACT | 3 refills | Status: DC

## 2019-12-15 MED ORDER — ANORO ELLIPTA 62.5-25 MCG/INH IN AEPB: 1.0000 | INHALATION_SPRAY | Freq: Every day | RESPIRATORY_TRACT | 3 refills | Status: DC

## 2019-12-15 NOTE — Telephone Encounter (Signed)
Patient is calling regarding the denial of the symboricort. Patient wanting to know the next steps. Patient states he is open to another medication. Please advise [phone]

## 2019-12-18 ENCOUNTER — Telehealth: Payer: Self-pay | Admitting: Pharmacist

## 2019-12-18 NOTE — Progress Notes (Signed)
  Chronic Care Management   Note  12/18/2019 Name: Christopher Davenport MRN: [id number] DOB: [birth year]     Verified patient picked up [place] on 12/15/19 at [facility] with no co-pay.     Junita Push. Kenton Kingfisher PharmD, [facility] [phone]

## 2019-12-20 ENCOUNTER — Telehealth: Payer: Self-pay | Admitting: Family Medicine

## 2019-12-20 NOTE — Telephone Encounter (Signed)
umeclidinium-vilanterol (ANORO ELLIPTA) 62.5-25 MCG/INH AEPB Medication Date: 12/15/2019 Department: [facility] Ordering/Authorizing: Valerie Roys, DO   Pt states the symptoms from this above med are Achy all over, general not feeling well, upset stomache, flu like symptoms.Marland Kitchen of course per prior note feels the drug is causing. He wants another med and he wants to know if PA for the other med is being worked on, Would like a CB this afternoon as is at home [phone]

## 2019-12-20 NOTE — Telephone Encounter (Signed)
Copied from [place] [id number]. Topic: General - Other >> Dec 20, 2019 11:15 AM Yvette Rack wrote: Reason for CRM: Pt stated the side effects of the new medication are pretty bad and he would like to know if someone is working on th PA for budesonide-formoterol (SYMBICORT) 160-4.5 MCG/ACT inhaler. Pt requests call back.

## 2019-12-20 NOTE — Telephone Encounter (Signed)
PA for brand Symbicort initiated and submitted via Cover My Meds. Key: [id number].  Called and notified patient. Will update when determination from insurance is received.

## 2019-12-20 NOTE — Telephone Encounter (Signed)
Tried calling patient, no answer and VM box not set up. Please find out from patient what medication is giving him side effects and what side effects he is having if he calls back.

## 2019-12-20 NOTE — Telephone Encounter (Signed)
See other phone encounter.  

## 2019-12-22 NOTE — Telephone Encounter (Signed)
Received approval for brand Symbicort. Called and notified patient of approval.

## 2020-01-05 ENCOUNTER — Other Ambulatory Visit: Payer: Self-pay | Admitting: Family Medicine

## 2020-01-05 NOTE — Telephone Encounter (Signed)
Attempted to call patient to schedule medication follow up- left message to call office. Courtesy RF #[phone] given

## 2020-01-08 ENCOUNTER — Other Ambulatory Visit: Payer: Self-pay | Admitting: Family Medicine

## 2020-01-08 MED ORDER — FLUOXETINE HCL 20 MG PO CAPS
20.0000 mg | ORAL_CAPSULE | Freq: Every day | ORAL | 0 refills | Status: DC
Start: 2020-01-08 — End: 2020-01-22

## 2020-01-08 NOTE — Telephone Encounter (Signed)
Routing to provider  

## 2020-01-08 NOTE — Telephone Encounter (Signed)
PT need a refill  FLUoxetine (PROZAC) 20 MG capsule [[id number]]  [id number] [name], [place] AT [facility] [address] [facility]  [place], [place] [address]  Phone:  [phone] Fax:  [phone]

## 2020-01-08 NOTE — Telephone Encounter (Signed)
Patient needs a courtesy refill for his medication, FLUoxetine (PROZAC) 20 MG capsule.  He stated he is out of the medication and already has scheduled an appt. For 11/01, but cannot wait that long to get his medication.  Please advise and call patient to confirm at [phone]

## 2020-01-08 NOTE — Addendum Note (Signed)
Addended by: Georgina Peer on: 01/08/2020 01:16 PM   Modules accepted: Orders

## 2020-01-22 ENCOUNTER — Ambulatory Visit (INDEPENDENT_AMBULATORY_CARE_PROVIDER_SITE_OTHER): Payer: BC Managed Care – PPO | Admitting: Family Medicine

## 2020-01-22 ENCOUNTER — Other Ambulatory Visit: Payer: Self-pay

## 2020-01-22 ENCOUNTER — Encounter: Payer: Self-pay | Admitting: Family Medicine

## 2020-01-22 VITALS — BP 117/70 | HR 54 | Temp 98.5°F | Wt 224.4 lb

## 2020-01-22 DIAGNOSIS — F411 Generalized anxiety disorder: Secondary | ICD-10-CM

## 2020-01-22 DIAGNOSIS — J4520 Mild intermittent asthma, uncomplicated: Secondary | ICD-10-CM | POA: Diagnosis not present

## 2020-01-22 MED ORDER — FLUOXETINE HCL 20 MG PO CAPS
20.0000 mg | ORAL_CAPSULE | Freq: Every day | ORAL | 1 refills | Status: DC
Start: 2020-01-22 — End: 2020-07-31

## 2020-01-22 MED ORDER — ALBUTEROL SULFATE HFA 108 (90 BASE) MCG/ACT IN AERS
INHALATION_SPRAY | RESPIRATORY_TRACT | 6 refills | Status: DC
Start: 2020-01-22 — End: 2020-08-15

## 2020-01-22 MED ORDER — BUSPIRONE HCL 10 MG PO TABS: 10.0000 mg | ORAL_TABLET | Freq: Three times a day (TID) | ORAL | 1 refills | Status: DC

## 2020-01-22 MED ORDER — BUDESONIDE-FORMOTEROL FUMARATE 160-4.5 MCG/ACT IN AERO
2.0000 | INHALATION_SPRAY | Freq: Two times a day (BID) | RESPIRATORY_TRACT | 3 refills | Status: DC
Start: 2020-01-22 — End: 2020-08-15

## 2020-01-22 NOTE — Assessment & Plan Note (Signed)
Under good control on current regimen. Continue current regimen. Continue to monitor. Call with any concerns. Refills given.   

## 2020-01-22 NOTE — Progress Notes (Signed)
BP 117/70   Pulse (!) 54   Temp 98.5 F (36.9 C) (Oral)   Wt 224 lb 6.4 oz (101.8 kg)   SpO2 98%   BMI 29.52 kg/m    Subjective:    Patient ID: Christopher Davenport, male    DOB: 1975-05-23, 44 y.o.   MRN: [id number]  HPI: Christopher Davenport is a 44 y.o. male  Chief Complaint  Patient presents with  . Anxiety    follow up and Fluoxetine refill   ANXIETY/STRESS Duration: chronic Status:stable Anxious mood: yes  Excessive worrying: no Irritability: no  Sweating: no Nausea: no Palpitations:no Hyperventilation: no Panic attacks: no Agoraphobia: no  Obscessions/compulsions: no Depressed mood: no Depression screen [facility] 2/9 01/22/2020 02/24/2019 07/21/2018 04/26/2018 07/31/2016  Decreased Interest 0 0 0 0 0  Down, Depressed, Hopeless 0 0 0 0 0  PHQ - 2 Score 0 0 0 0 0  Altered sleeping 3 2 0 0 -  Tired, decreased energy 3 1 1  0 -  Change in appetite 0 1 0 0 -  Feeling bad or failure about yourself  0 0 0 0 -  Trouble concentrating 1 1 1  0 -  Moving slowly or fidgety/restless 1 0 0 0 -  Suicidal thoughts 0 0 0 0 -  PHQ-9 Score 8 5 2  0 -  Difficult doing work/chores Not difficult at all Not difficult at all - Not difficult at all -   GAD 7 : Generalized Anxiety Score 01/22/2020 07/21/2018 05/17/2015  Nervous, Anxious, on Edge 2 2 3   Control/stop worrying 2 0 1  Worry too much - different things 2 0 3  Trouble relaxing 3 0 2  Restless 0 0 1  Easily annoyed or irritable 1 2 2   Afraid - awful might happen 0 0 0  Total GAD 7 Score 10 4 12   Anxiety Difficulty Not difficult at all Not difficult at all Somewhat difficult   Anhedonia: no Weight changes: no Insomnia: no   Hypersomnia: no Fatigue/loss of energy: yes Feelings of worthlessness: no Feelings of guilt: no Impaired concentration/indecisiveness: no Suicidal ideations: no  Crying spells: no Recent Stressors/Life Changes: no   Relationship problems: no   Family stress: no     Financial stress: no    Job  stress: yes    Recent death/loss: no  ASTHMA Asthma status: stable Satisfied with current treatment?: yes Albuterol/rescue inhaler frequency: rarely Dyspnea frequency: rarely Wheezing frequency: rarely Cough frequency: never Nocturnal symptom frequency: never  Limitation of activity: no Current upper respiratory symptoms: no Aerochamber/spacer use: no Visits to ER or Urgent Care in past year: no Pneumovax: Up to Date Influenza: will get next week- just got COVID vaccine  Relevant past medical, surgical, family and social history reviewed and updated as indicated. Interim medical history since our last visit reviewed. Allergies and medications reviewed and updated.  Review of Systems  Constitutional: Negative.   Respiratory: Negative.   Cardiovascular: Negative.   Gastrointestinal: Negative.   Musculoskeletal: Negative.   Neurological: Negative.   Psychiatric/Behavioral: Negative.     Per HPI unless specifically indicated above     Objective:    BP 117/70   Pulse (!) 54   Temp 98.5 F (36.9 C) (Oral)   Wt 224 lb 6.4 oz (101.8 kg)   SpO2 98%   BMI 29.52 kg/m   Wt Readings from Last 3 Encounters:  01/22/20 224 lb 6.4 oz (101.8 kg)  02/24/19 239 lb 6 oz (108.6 kg)  04/26/18 229  lb 6.4 oz (104.1 kg)    Physical Exam Vitals and nursing note reviewed.  Constitutional:      General: He is not in acute distress.    Appearance: Normal appearance. He is not ill-appearing, toxic-appearing or diaphoretic.  HENT:     Head: Normocephalic and atraumatic.     Right Ear: External ear normal.     Left Ear: External ear normal.     Nose: Nose normal.     Mouth/Throat:     Mouth: Mucous membranes are moist.     Pharynx: Oropharynx is clear.  Eyes:     General: No scleral icterus.       Right eye: No discharge.        Left eye: No discharge.     Extraocular Movements: Extraocular movements intact.     Conjunctiva/sclera: Conjunctivae normal.     Pupils: Pupils are equal,  round, and reactive to light.  Cardiovascular:     Rate and Rhythm: Normal rate and regular rhythm.     Pulses: Normal pulses.     Heart sounds: Normal heart sounds. No murmur heard.  No friction rub. No gallop.   Pulmonary:     Effort: Pulmonary effort is normal. No respiratory distress.     Breath sounds: Normal breath sounds. No stridor. No wheezing, rhonchi or rales.  Chest:     Chest wall: No tenderness.  Musculoskeletal:        General: Normal range of motion.     Cervical back: Normal range of motion and neck supple.  Skin:    General: Skin is warm and dry.     Capillary Refill: Capillary refill takes less than 2 seconds.     Coloration: Skin is not jaundiced or pale.     Findings: No bruising, erythema, lesion or rash.  Neurological:     General: No focal deficit present.     Mental Status: He is alert and oriented to person, place, and time. Mental status is at baseline.  Psychiatric:        Mood and Affect: Mood normal.        Behavior: Behavior normal.        Thought Content: Thought content normal.        Judgment: Judgment normal.     Results for orders placed or performed in visit on 02/24/19  Bayer DCA Hb A1c Waived  Result Value Ref Range   HB A1C (BAYER DCA - WAIVED) 4.9 <7.0 %  CBC with Differential OUT  Result Value Ref Range   WBC 6.4 3.4 - 10.8 x10E3/uL   RBC 4.69 4.14 - 5.80 x10E6/uL   Hemoglobin 15.1 13.0 - 17.7 g/dL   Hematocrit 44.3 37.5 - 51.0 %   MCV 95 79 - 97 fL   MCH 32.2 26.6 - 33.0 pg   MCHC 34.1 31 - 35 g/dL   RDW 13.1 11.6 - 15.4 %   Platelets 233 150 - 450 x10E3/uL   Neutrophils 53 Not Estab. %   Lymphs 33 Not Estab. %   Monocytes 7 Not Estab. %   Eos 6 Not Estab. %   Basos 1 Not Estab. %   Neutrophils Absolute 3.4 1.40 - 7.00 x10E3/uL   Lymphocytes Absolute 2.1 0 - 3 x10E3/uL   Monocytes Absolute 0.4 0 - 0 x10E3/uL   EOS (ABSOLUTE) 0.4 0.0 - 0.4 x10E3/uL   Basophils Absolute 0.1 0 - 0 x10E3/uL   Immature Granulocytes 0 Not  Estab. %  Immature Grans (Abs) 0.0 0.0 - 0.1 x10E3/uL  Comp Met (CMET)  Result Value Ref Range   Glucose 95 65 - 99 mg/dL   BUN 19 6 - 24 mg/dL   Creatinine, Ser 1.22 0.76 - 1.27 mg/dL   GFR calc non Af Amer 71 >59 mL/min/1.73   GFR calc Af Amer 82 >59 mL/min/1.73   BUN/Creatinine Ratio 16 9 - 20   Sodium 138 134 - 144 mmol/L   Potassium 4.3 3.5 - 5.2 mmol/L   Chloride 100 96 - 106 mmol/L   CO2 23 20 - 29 mmol/L   Calcium 9.7 8.7 - 10.2 mg/dL   Total Protein 7.6 6.0 - 8.5 g/dL   Albumin 4.7 4.0 - 5.0 g/dL   Globulin, Total 2.9 1.5 - 4.5 g/dL   Albumin/Globulin Ratio 1.6 1.2 - 2.2   Bilirubin Total 0.7 0.0 - 1.2 mg/dL   Alkaline Phosphatase 87 39 - 117 IU/L   AST 23 0 - 40 IU/L   ALT 34 0 - 44 IU/L  Lipid Panel w/o Chol/HDL Ratio OUT  Result Value Ref Range   Cholesterol, Total 237 (H) 100 - 199 mg/dL   Triglycerides 294 (H) 0 - 149 mg/dL   HDL 49 >39 mg/dL   VLDL Cholesterol Cal 53 (H) 5 - 40 mg/dL   LDL Chol Calc (NIH) 135 (H) 0 - 99 mg/dL  TSH  Result Value Ref Range   TSH 2.610 0.450 - 4.500 uIU/mL  UA/M w/rflx Culture, Routine   Specimen: Blood   BLD  Result Value Ref Range   Specific Gravity, UA 1.020 1.005 - 1.030   pH, UA 5.5 5.0 - 7.5   Color, UA Yellow Yellow   Appearance Ur Clear Clear   Leukocytes,UA Negative Negative   Protein,UA Negative Negative/Trace   Glucose, UA Negative Negative   Ketones, UA Negative Negative   RBC, UA Negative Negative   Bilirubin, UA Negative Negative   Urobilinogen, Ur 0.2 0.2 - 1.0 mg/dL   Nitrite, UA Negative Negative  SAR CoV2 Serology (COVID 19)AB(IGG)IA  Result Value Ref Range   SARS-CoV-2 Ab, IgG Comment Negative  Euroimmun SARS-CoV-2 Ab, IgG  Result Value Ref Range   Euroimmun SARS-CoV-2 Ab, IgG Negative Negative      Assessment & Plan:   Problem List Items Addressed This Visit      Respiratory   Asthma - Primary    Under good control on current regimen. Continue current regimen. Continue to monitor. Call  with any concerns. Refills given.        Relevant Medications   budesonide-formoterol (SYMBICORT) 160-4.5 MCG/ACT inhaler   albuterol (PROAIR HFA) 108 (90 Base) MCG/ACT inhaler     Other   Anxiety disorder    Under good control on current regimen. Continue current regimen. Continue to monitor. Call with any concerns. Refills given.        Relevant Medications   FLUoxetine (PROZAC) 20 MG capsule   busPIRone (BUSPAR) 10 MG tablet       Follow up plan: Return in about 6 months (around 07/21/2020) for Physical.

## 2020-03-05 ENCOUNTER — Telehealth: Payer: Self-pay | Admitting: Family Medicine

## 2020-03-05 NOTE — Telephone Encounter (Signed)
Pt called and is requesting to speak with someone regarding this prescription. He states that he is completely out of this medication. Please advise .

## 2020-03-05 NOTE — Telephone Encounter (Signed)
Patient notified that he should have refills on his inhaler at the pharmacy

## 2020-03-18 ENCOUNTER — Telehealth: Payer: Self-pay

## 2020-03-18 NOTE — Telephone Encounter (Signed)
Copied from CRM [id number]. Topic: General - Other >> Mar 18, 2020  4:14 PM Gwenlyn Fudge wrote: Reason for CRM: PT called stating that he has had a cough for 3 days. He states that he cannot wait until next available on 03/21/20. Please advise.

## 2020-03-18 NOTE — Telephone Encounter (Signed)
Pt scheduled for 03/20/2020 Pt stated he would call to [place] if he felt better.

## 2020-03-20 ENCOUNTER — Encounter: Payer: Self-pay | Admitting: Family Medicine

## 2020-03-20 ENCOUNTER — Telehealth (INDEPENDENT_AMBULATORY_CARE_PROVIDER_SITE_OTHER): Payer: BC Managed Care – PPO | Admitting: Family Medicine

## 2020-03-20 DIAGNOSIS — J4520 Mild intermittent asthma, uncomplicated: Secondary | ICD-10-CM

## 2020-03-20 MED ORDER — BENZONATATE 100 MG PO CAPS: 100.0000 mg | ORAL_CAPSULE | Freq: Three times a day (TID) | ORAL | 0 refills | Status: DC | PRN

## 2020-03-20 NOTE — Assessment & Plan Note (Signed)
With very mild exacerbation 2/2 viral URI, no COVID contacts. Discussed OTC symptom relief. Rx for tessalon sent. Return and emergency precautions discussed.

## 2020-03-20 NOTE — Progress Notes (Signed)
Virtual Visit via Video Note  I connected with Christopher Davenport on 03/20/20 at 10:00 AM EST by a video enabled telemedicine application and verified that I am speaking with the correct person using two identifiers.  Location: Patient: home Provider: [facility]   I discussed the limitations of evaluation and management by telemedicine and the availability of in person appointments. The patient expressed understanding and agreed to proceed.  History of Present Illness:  COUGH - h/o asthma on symbicort, albuterol prn  - kids with URI, COVID negative - a little more productive now. Using Robatussin without relief. - compliant with symbicort. Hasn't had to use rescue inhaler.  - fully vaccinated.  - wants cough medicine to help during test tomorrow.  Duration: 3 days, since 12/25 Circumstances of initial development of cough: URI Cough severity: moderate Cough description: initially nonproductive, now productive of phlegm Aggravating factors:  nothing Alleviating factors: nothing Status:  stable Treatments attempted: robatussin Wheezing: no Shortness of breath: no Chest pain: no Chest tightness:no Nasal congestion: no Runny nose: no Hemoptysis: no Fevers: no Night sweats: no Heartburn: no UTD with pneumovax, COVID with booster.   Observations/Objective:  Well appearing, in NAD. Speaks in full sentences, no resp distress.  Assessment and Plan:  Asthma With very mild exacerbation 2/2 viral URI, no COVID contacts. Discussed OTC symptom relief. Rx for tessalon sent. Return and emergency precautions discussed.     I discussed the assessment and treatment plan with the patient. The patient was provided an opportunity to ask questions and all were answered. The patient agreed with the plan and demonstrated an understanding of the instructions.   The patient was advised to call back or seek an in-person evaluation if the symptoms worsen or if the condition fails to improve as  anticipated.  I provided 8 minutes of non-face-to-face time during this encounter.   Caro Laroche, DO

## 2020-07-26 ENCOUNTER — Encounter: Payer: BC Managed Care – PPO | Admitting: Family Medicine

## 2020-07-30 ENCOUNTER — Encounter: Payer: BC Managed Care – PPO | Admitting: Family Medicine

## 2020-07-31 ENCOUNTER — Other Ambulatory Visit: Payer: Self-pay | Admitting: Family Medicine

## 2020-07-31 NOTE — Telephone Encounter (Signed)
Requested Prescriptions  Pending Prescriptions Disp Refills  . FLUoxetine (PROZAC) 20 MG capsule [Pharmacy Med Name: FLUOXETINE 20MG  CAPSULES] 90 capsule 0    Sig: TAKE 1 CAPSULE(20 MG) BY MOUTH DAILY     Psychiatry:  Antidepressants - SSRI Passed - 07/31/2020  5:07 PM      Passed - Valid encounter within last 6 months    Recent Outpatient Visits          4 months ago Mild intermittent asthma without complication   [facility] Myles Gip, DO   6 months ago Mild intermittent asthma without complication   [place], Megan P, DO   1 year ago Routine general medical examination at a health care facility   [facility], Kimball, DO   2 years ago Generalized anxiety disorder   [place], Megan P, DO   2 years ago Acute non-recurrent maxillary sinusitis   [facility] Kathrine Haddock, NP      Future Appointments            In 2 weeks Wynetta Emery, Barb Merino, DO [facility], [facility]

## 2020-08-15 ENCOUNTER — Other Ambulatory Visit: Payer: Self-pay

## 2020-08-15 ENCOUNTER — Encounter: Payer: Self-pay | Admitting: Family Medicine

## 2020-08-15 ENCOUNTER — Ambulatory Visit (INDEPENDENT_AMBULATORY_CARE_PROVIDER_SITE_OTHER): Payer: BC Managed Care – PPO | Admitting: Family Medicine

## 2020-08-15 VITALS — BP 115/76 | HR 62 | Temp 98.0°F | Ht 73.0 in | Wt 226.0 lb

## 2020-08-15 DIAGNOSIS — Z0000 Encounter for general adult medical examination without abnormal findings: Secondary | ICD-10-CM

## 2020-08-15 DIAGNOSIS — J4520 Mild intermittent asthma, uncomplicated: Secondary | ICD-10-CM | POA: Diagnosis not present

## 2020-08-15 DIAGNOSIS — F411 Generalized anxiety disorder: Secondary | ICD-10-CM

## 2020-08-15 DIAGNOSIS — Z136 Encounter for screening for cardiovascular disorders: Secondary | ICD-10-CM | POA: Diagnosis not present

## 2020-08-15 DIAGNOSIS — Z1211 Encounter for screening for malignant neoplasm of colon: Secondary | ICD-10-CM

## 2020-08-15 LAB — URINALYSIS, ROUTINE W REFLEX MICROSCOPIC
Bilirubin, UA: NEGATIVE
Glucose, UA: NEGATIVE
Ketones, UA: NEGATIVE
Leukocytes,UA: NEGATIVE
Nitrite, UA: NEGATIVE
Protein,UA: NEGATIVE
RBC, UA: NEGATIVE
Specific Gravity, UA: 1.02 (ref 1.005–1.030)
Urobilinogen, Ur: 0.2 mg/dL (ref 0.2–1.0)
pH, UA: 6 (ref 5.0–7.5)

## 2020-08-15 MED ORDER — ALBUTEROL SULFATE HFA 108 (90 BASE) MCG/ACT IN AERS: INHALATION_SPRAY | RESPIRATORY_TRACT | 6 refills | Status: DC

## 2020-08-15 MED ORDER — BUSPIRONE HCL 10 MG PO TABS: 10.0000 mg | ORAL_TABLET | Freq: Three times a day (TID) | ORAL | 1 refills | Status: DC

## 2020-08-15 MED ORDER — BUDESONIDE-FORMOTEROL FUMARATE 160-4.5 MCG/ACT IN AERO: 2.0000 | INHALATION_SPRAY | Freq: Two times a day (BID) | RESPIRATORY_TRACT | 3 refills | Status: DC

## 2020-08-15 MED ORDER — FLUOXETINE HCL 20 MG PO CAPS: ORAL_CAPSULE | ORAL | 1 refills | Status: DC

## 2020-08-15 NOTE — Assessment & Plan Note (Signed)
Under good control on current regimen. Continue current regimen. Continue to monitor. Call with any concerns. Refills given.   

## 2020-08-15 NOTE — Patient Instructions (Signed)

## 2020-08-15 NOTE — Progress Notes (Addendum)
BP 115/76   Pulse 62   Temp 98 F (36.7 C)   Ht _0  (1.854 m)   Wt 226 lb (102.5 kg)   SpO2 99%   BMI 29.82 kg/m    Subjective:    Patient ID: Christopher Davenport, male    DOB: 06-20-1975, 45 y.o.   MRN: [id number]  HPI: Christopher Davenport is a 45 y.o. male presenting on 08/15/2020 for comprehensive medical examination. Current medical complaints include:  ANXIETY/STRESS Duration: Chronic Status:controlled Anxious mood: no  Excessive worrying: no Irritability: no  Sweating: no Nausea: no Palpitations:no Hyperventilation: no Panic attacks: no Agoraphobia: no  Obscessions/compulsions: no Depressed mood: no Depression screen [facility] 2/9 08/15/2020 01/22/2020 02/24/2019 07/21/2018 04/26/2018  Decreased Interest 0 0 0 0 0  Down, Depressed, Hopeless 0 0 0 0 0  PHQ - 2 Score 0 0 0 0 0  Altered sleeping _1 0 0  Tired, decreased energy _2 0  Change in appetite 0 0 1 0 0  Feeling bad or failure about yourself  0 0 0 0 0  Trouble concentrating _3 0  Moving slowly or fidgety/restless 0 1 0 0 0  Suicidal thoughts 0 0 0 0 0  PHQ-9 Score _4 0  Difficult doing work/chores Not difficult at all Not difficult at all Not difficult at all - Not difficult at all   GAD 7 : Generalized Anxiety Score 08/15/2020 01/22/2020 07/21/2018 05/17/2015  Nervous, Anxious, on Edge _5 Control/stop worrying 1 2 0 1  Worry too much - different things 1 2 0 3  Trouble relaxing 1 3 0 2  Restless 0 0 0 1  Easily annoyed or irritable _6 Afraid - awful might happen 0 0 0 0  Total GAD 7 Score _7 Anxiety Difficulty Not difficult at all Not difficult at all Not difficult at all Somewhat difficult    Anhedonia: no Weight changes: no Insomnia: no   Hypersomnia: no Fatigue/loss of energy: no Feelings of worthlessness: no Feelings of guilt: no Impaired concentration/indecisiveness: no Suicidal ideations: no  Crying spells: no Recent Stressors/Life Changes: no    Relationship problems: no   Family stress: no     Financial stress: no    Job stress: no    Recent death/loss: no  ASTHMA Asthma status: stable Satisfied with current treatment?: yes Albuterol/rescue inhaler frequency: before he works out Dyspnea frequency: rarely Wheezing frequency: rarey Cough frequency: rarely Nocturnal symptom frequency: none  Limitation of activity: no Current upper respiratory symptoms: yes Triggers: allergies Visits to ER or Urgent Care in past year: no Pneumovax: Up to Date Influenza: Up to Date  Interim Problems from his last visit: no  Depression Screen done today and results listed below:  Depression screen [facility] 2/9 08/15/2020 01/22/2020 02/24/2019 07/21/2018 04/26/2018  Decreased Interest 0 0 0 0 0  Down, Depressed, Hopeless 0 0 0 0 0  PHQ - 2 Score 0 0 0 0 0  Altered sleeping _8 0 0  Tired, decreased energy _9 0  Change in appetite 0 0 1 0 0  Feeling bad or failure about yourself  0 0 0 0 0  Trouble concentrating _10 0  Moving slowly or fidgety/restless 0 1 0 0 0  Suicidal thoughts 0 0 0 0 0  PHQ-9 Score _11 0  Difficult doing work/chores Not difficult at all Not difficult at all Not difficult at all - Not difficult at all    Past Medical History:  Past Medical History:  Diagnosis Date  . Anxiety   . Asthma   . GERD (gastroesophageal reflux disease)     Surgical History:  History reviewed. No pertinent surgical history.  Medications:  Current Outpatient Medications on File Prior to Visit  Medication Sig  . b complex vitamins tablet Take 1 tablet by mouth daily.  . Multiple Vitamin (MULTIVITAMIN WITH MINERALS) TABS tablet Take 1 tablet by mouth daily.  . vitamin C (ASCORBIC ACID) 500 MG tablet Take 500 mg by mouth daily.   No current facility-administered medications on file prior to visit.    Allergies:  Allergies  Allergen Reactions  . Anoro Ellipta [Umeclidinium-Vilanterol] Diarrhea and Other (See Comments)     "achy all over"    Social History:  Social History   Socioeconomic History  . Marital status: Married    Spouse name: Not on file  . Number of children: Not on file  . Years of education: Not on file  . Highest education level: Not on file  Occupational History  . Not on file  Tobacco Use  . Smoking status: Former Smoker    Quit date: 03/23/2012    Years since quitting: 8.4  . Smokeless tobacco: Current User    Types: Chew  Vaping Use  . Vaping Use: Never used  Substance and Sexual Activity  . Alcohol use: Yes    Alcohol/week: 0.0 standard drinks    Comment: 2 during the week and 3-4 on weekends  . Drug use: No  . Sexual activity: Yes  Other Topics Concern  . Not on file  Social History Narrative  . Not on file   Social Determinants of Health   Financial Resource Strain: Not on file  Food Insecurity: Not on file  Transportation Needs: Not on file  Physical Activity: Not on file  Stress: Not on file  Social Connections: Not on file  Intimate Partner Violence: Not on file   Social History   Tobacco Use  Smoking Status Former Smoker  . Quit date: 03/23/2012  . Years since quitting: 8.4  Smokeless Tobacco Current User  . Types: Chew   Social History   Substance and Sexual Activity  Alcohol Use Yes  . Alcohol/week: 0.0 standard drinks   Comment: 2 during the week and 3-4 on weekends    Family History:  Family History  Problem Relation Age of Onset  . Diabetes Mother   . Kidney disease Mother   . Heart disease Mother   . Hypertension Mother   . Hyperlipidemia Mother   . Dementia Maternal Grandmother   . Diabetes Maternal Grandfather   . Diabetes Paternal Grandmother     Past medical history, surgical history, medications, allergies, family history and social history reviewed with patient today and changes made to appropriate areas of the chart.   Review of Systems  Constitutional: Negative.   HENT: Negative.   Eyes: Positive for blurred vision.  Negative for double vision, photophobia, pain, discharge and redness.  Respiratory: Negative.   Cardiovascular: Negative.   Gastrointestinal: Positive for heartburn. Negative for abdominal pain, blood in stool, constipation, diarrhea, melena, nausea and vomiting.  Genitourinary: Negative.   Musculoskeletal: Negative.   Skin: Negative.   Neurological: Negative.   Endo/Heme/Allergies: Positive for environmental allergies and polydipsia. Does not bruise/bleed easily.  Psychiatric/Behavioral: Negative.    All  other ROS negative except what is listed above and in the HPI.      Objective:    BP 115/76   Pulse 62   Temp 98 F (36.7 C)   Ht _0  (1.854 m)   Wt 226 lb (102.5 kg)   SpO2 99%   BMI 29.82 kg/m   Wt Readings from Last 3 Encounters:  08/15/20 226 lb (102.5 kg)  01/22/20 224 lb 6.4 oz (101.8 kg)  02/24/19 239 lb 6 oz (108.6 kg)    Physical Exam Vitals and nursing note reviewed.  Constitutional:      General: He is not in acute distress.    Appearance: Normal appearance. He is obese. He is not ill-appearing, toxic-appearing or diaphoretic.  HENT:     Head: Normocephalic and atraumatic.     Right Ear: Tympanic membrane, ear canal and external ear normal. There is no impacted cerumen.     Left Ear: Tympanic membrane, ear canal and external ear normal. There is no impacted cerumen.     Nose: Nose normal. No congestion or rhinorrhea.     Mouth/Throat:     Mouth: Mucous membranes are moist.     Pharynx: Oropharynx is clear. No oropharyngeal exudate or posterior oropharyngeal erythema.  Eyes:     General: No scleral icterus.       Right eye: No discharge.        Left eye: No discharge.     Extraocular Movements: Extraocular movements intact.     Conjunctiva/sclera: Conjunctivae normal.     Pupils: Pupils are equal, round, and reactive to light.  Neck:     Vascular: No carotid bruit.  Cardiovascular:     Rate and Rhythm: Normal rate and regular rhythm.     Pulses:  Normal pulses.     Heart sounds: No murmur heard. No friction rub. No gallop.   Pulmonary:     Effort: Pulmonary effort is normal. No respiratory distress.     Breath sounds: Normal breath sounds. No stridor. No wheezing, rhonchi or rales.  Chest:     Chest wall: No tenderness.  Abdominal:     General: Abdomen is flat. Bowel sounds are normal. There is no distension.     Palpations: Abdomen is soft. There is no mass.     Tenderness: There is no abdominal tenderness. There is no right CVA tenderness, left CVA tenderness, guarding or rebound.     Hernia: No hernia is present.  Genitourinary:    Comments: Genital exam deferred with shared decision making Musculoskeletal:        General: No swelling, tenderness, deformity or signs of injury.     Cervical back: Normal range of motion and neck supple. No rigidity. No muscular tenderness.     Right lower leg: No edema.     Left lower leg: No edema.  Lymphadenopathy:     Cervical: No cervical adenopathy.  Skin:    General: Skin is warm and dry.     Capillary Refill: Capillary refill takes less than 2 seconds.     Coloration: Skin is not jaundiced or pale.     Findings: No bruising, erythema, lesion or rash.  Neurological:     General: No focal deficit present.     Mental Status: He is alert and oriented to person, place, and time.     Cranial Nerves: No cranial nerve deficit.     Sensory: No sensory deficit.     Motor: No weakness.  Coordination: Coordination normal.     Gait: Gait normal.     Deep Tendon Reflexes: Reflexes normal.  Psychiatric:        Mood and Affect: Mood normal.        Behavior: Behavior normal.        Thought Content: Thought content normal.        Judgment: Judgment normal.     Results for orders placed or performed in visit on 02/24/19  Bayer DCA Hb A1c Waived  Result Value Ref Range   HB A1C (BAYER DCA - WAIVED) 4.9 <7.0 %  CBC with Differential OUT  Result Value Ref Range   WBC 6.4 3.4 - 10.8  x10E3/uL   RBC 4.69 4.14 - 5.80 x10E6/uL   Hemoglobin 15.1 13.0 - 17.7 g/dL   Hematocrit 44.3 37.5 - 51.0 %   MCV 95 79 - 97 fL   MCH 32.2 26.6 - 33.0 pg   MCHC 34.1 31.5 - 35.7 g/dL   RDW 13.1 11.6 - 15.4 %   Platelets 233 150 - 450 x10E3/uL   Neutrophils 53 Not Estab. %   Lymphs 33 Not Estab. %   Monocytes 7 Not Estab. %   Eos 6 Not Estab. %   Basos 1 Not Estab. %   Neutrophils Absolute 3.4 1.4 - 7.0 x10E3/uL   Lymphocytes Absolute 2.1 0.7 - 3.1 x10E3/uL   Monocytes Absolute 0.4 0.1 - 0.9 x10E3/uL   EOS (ABSOLUTE) 0.4 0.0 - 0.4 x10E3/uL   Basophils Absolute 0.1 0.0 - 0.2 x10E3/uL   Immature Granulocytes 0 Not Estab. %   Immature Grans (Abs) 0.0 0.0 - 0.1 x10E3/uL  Comp Met (CMET)  Result Value Ref Range   Glucose 95 65 - 99 mg/dL   BUN 19 6 - 24 mg/dL   Creatinine, Ser 1.22 0.76 - 1.27 mg/dL   GFR calc non Af Amer 71 >59 mL/min/1.73   GFR calc Af Amer 82 >59 mL/min/1.73   BUN/Creatinine Ratio 16 9 - 20   Sodium 138 134 - 144 mmol/L   Potassium 4.3 3.5 - 5.2 mmol/L   Chloride 100 96 - 106 mmol/L   CO2 23 20 - 29 mmol/L   Calcium 9.7 8.7 - 10.2 mg/dL   Total Protein 7.6 6.0 - 8.5 g/dL   Albumin 4.7 4.0 - 5.0 g/dL   Globulin, Total 2.9 1.5 - 4.5 g/dL   Albumin/Globulin Ratio 1.6 1.2 - 2.2   Bilirubin Total 0.7 0.0 - 1.2 mg/dL   Alkaline Phosphatase 87 39 - 117 IU/L   AST 23 0 - 40 IU/L   ALT 34 0 - 44 IU/L  Lipid Panel w/o Chol/HDL Ratio OUT  Result Value Ref Range   Cholesterol, Total 237 (H) 100 - 199 mg/dL   Triglycerides 294 (H) 0 - 149 mg/dL   HDL 49 >39 mg/dL   VLDL Cholesterol Cal 53 (H) 5 - 40 mg/dL   LDL Chol Calc (NIH) 135 (H) 0 - 99 mg/dL  TSH  Result Value Ref Range   TSH 2.610 0.450 - 4.500 uIU/mL  UA/M w/rflx Culture, Routine   Specimen: Blood   BLD  Result Value Ref Range   Specific Gravity, UA 1.020 1.005 - 1.030   pH, UA 5.5 5.0 - 7.5   Color, UA Yellow Yellow   Appearance Ur Clear Clear   Leukocytes,UA Negative Negative   Protein,UA  Negative Negative/Trace   Glucose, UA Negative Negative   Ketones, UA Negative Negative   RBC, UA Negative  Negative   Bilirubin, UA Negative Negative   Urobilinogen, Ur 0.2 0.2 - 1.0 mg/dL   Nitrite, UA Negative Negative  SAR CoV2 Serology (COVID 19)AB(IGG)IA  Result Value Ref Range   SARS-CoV-2 Ab, IgG Comment Negative  Euroimmun SARS-CoV-2 Ab, IgG  Result Value Ref Range   Euroimmun SARS-CoV-2 Ab, IgG Negative Negative      Assessment & Plan:   Problem List Items Addressed This Visit      Respiratory   Asthma    Under good control on current regimen. Continue current regimen. Continue to monitor. Call with any concerns. Refills given.        Relevant Medications   albuterol (PROAIR HFA) 108 (90 Base) MCG/ACT inhaler   budesonide-formoterol (SYMBICORT) 160-4.5 MCG/ACT inhaler     Other   Anxiety disorder    Under good control on current regimen. Continue current regimen. Continue to monitor. Call with any concerns. Refills given.       Relevant Medications   FLUoxetine (PROZAC) 20 MG capsule   busPIRone (BUSPAR) 10 MG tablet    Other Visit Diagnoses    Routine general medical examination at a health care facility    -  Primary   Relevant Orders   Comprehensive metabolic panel   CBC with Differential/Platelet   Lipid Panel w/o Chol/HDL Ratio   TSH   Urinalysis, Routine w reflex microscopic   Hepatitis C Antibody   PSA   Screening for colon cancer       Screening for colon cancer   Relevant Orders   Ambulatory referral to Gastroenterology      LABORATORY TESTING:  Health maintenance labs ordered today as discussed above.   IMMUNIZATIONS:   - Tdap: Tetanus vaccination status reviewed: last tetanus booster within 10 years. - Influenza: Up to date - Pneumovax: Up to date - Prevnar: Not applicable - COVID: Up to date  SCREENING: - Colonoscopy: Ordered today  Discussed with patient purpose of the colonoscopy is to detect colon cancer at curable precancerous  or early stages   PATIENT COUNSELING:    Sexuality: Discussed sexually transmitted diseases, partner selection, use of condoms, avoidance of unintended pregnancy  and contraceptive alternatives.   Advised to avoid cigarette smoking.  I discussed with the patient that most people either abstain from alcohol or drink within safe limits (<=14/week and <=4 drinks/occasion for males, <=7/weeks and <= 3 drinks/occasion for females) and that the risk for alcohol disorders and other health effects rises proportionally with the number of drinks per week and how often a drinker exceeds daily limits.  Discussed cessation/primary prevention of drug use and availability of treatment for abuse.   Diet: Encouraged to adjust caloric intake to maintain  or achieve ideal body weight, to reduce intake of dietary saturated fat and total fat, to limit sodium intake by avoiding high sodium foods and not adding table salt, and to maintain adequate dietary potassium and calcium preferably from fresh fruits, vegetables, and low-fat dairy products.    stressed the importance of regular exercise  Injury prevention: Discussed safety belts, safety helmets, smoke detector, smoking near bedding or upholstery.   Dental health: Discussed importance of regular tooth brushing, flossing, and dental visits.   Follow up plan: NEXT PREVENTATIVE PHYSICAL DUE IN 1 YEAR. Return in about 6 months (around 02/15/2021).

## 2020-08-16 ENCOUNTER — Encounter: Payer: Self-pay | Admitting: Family Medicine

## 2020-08-16 LAB — COMPREHENSIVE METABOLIC PANEL
ALT: 18 IU/L (ref 0–44)
AST: 18 IU/L (ref 0–40)
Albumin/Globulin Ratio: 2 (ref 1.2–2.2)
Albumin: 4.6 g/dL (ref 4.0–5.0)
Alkaline Phosphatase: 66 IU/L (ref 44–121)
BUN/Creatinine Ratio: 14 (ref 9–20)
BUN: 18 mg/dL (ref 6–24)
Bilirubin Total: 0.6 mg/dL (ref 0.0–1.2)
CO2: 21 mmol/L (ref 20–29)
Calcium: 9.5 mg/dL (ref 8.7–10.2)
Chloride: 103 mmol/L (ref 96–106)
Creatinine, Ser: 1.25 mg/dL (ref 0.76–1.27)
Globulin, Total: 2.3 g/dL (ref 1.5–4.5)
Glucose: 86 mg/dL (ref 65–99)
Potassium: 4.2 mmol/L (ref 3.5–5.2)
Sodium: 140 mmol/L (ref 134–144)
Total Protein: 6.9 g/dL (ref 6.0–8.5)
eGFR: 71 mL/min/{1.73_m2} (ref >59)

## 2020-08-16 LAB — CBC WITH DIFFERENTIAL/PLATELET
Basophils Absolute: 0.1 10*3/uL (ref 0.0–0.2)
Basos: 1 %
EOS (ABSOLUTE): 0.4 10*3/uL (ref 0.0–0.4)
Eos: 7 %
Hematocrit: 42.8 % (ref 37.5–51.0)
Hemoglobin: 14.4 g/dL (ref 13.0–17.7)
Immature Grans (Abs): 0 10*3/uL (ref 0.0–0.1)
Immature Granulocytes: 0 %
Lymphocytes Absolute: 1.4 10*3/uL (ref 0.7–3.1)
Lymphs: 30 %
MCH: 32.9 pg (ref 26.6–33.0)
MCHC: 33.6 g/dL (ref 31.5–35.7)
MCV: 98 fL — ABNORMAL HIGH (ref 79–97)
Monocytes Absolute: 0.4 10*3/uL (ref 0.1–0.9)
Monocytes: 9 %
Neutrophils Absolute: 2.5 10*3/uL (ref 1.4–7.0)
Neutrophils: 53 %
Platelets: 221 10*3/uL (ref 150–450)
RBC: 4.38 x10E6/uL (ref 4.14–5.80)
RDW: 12.9 % (ref 11.6–15.4)
WBC: 4.8 10*3/uL (ref 3.4–10.8)

## 2020-08-16 LAB — LIPID PANEL W/O CHOL/HDL RATIO
Cholesterol, Total: 228 mg/dL — ABNORMAL HIGH (ref 100–199)
HDL: 49 mg/dL (ref >39)
LDL Chol Calc (NIH): 155 mg/dL — ABNORMAL HIGH (ref 0–99)
Triglycerides: 135 mg/dL (ref 0–149)
VLDL Cholesterol Cal: 24 mg/dL (ref 5–40)

## 2020-08-16 LAB — HEPATITIS C ANTIBODY: Hep C Virus Ab: <0.1 s/co ratio (ref 0.0–0.9)

## 2020-08-16 LAB — PSA: Prostate Specific Ag, Serum: 0.5 ng/mL (ref 0.0–4.0)

## 2020-08-16 LAB — TSH: TSH: 2.91 u[IU]/mL (ref 0.450–4.500)

## 2020-09-26 ENCOUNTER — Telehealth (INDEPENDENT_AMBULATORY_CARE_PROVIDER_SITE_OTHER): Payer: BC Managed Care – PPO | Admitting: Gastroenterology

## 2020-09-26 DIAGNOSIS — Z1211 Encounter for screening for malignant neoplasm of colon: Secondary | ICD-10-CM

## 2020-09-26 MED ORDER — PEG 3350-KCL-NA BICARB-NACL 420 G PO SOLR: 4000.0000 mL | Freq: Once | ORAL | 0 refills | Status: AC

## 2020-09-26 NOTE — Progress Notes (Signed)
Gastroenterology Pre-Procedure Review  Request Date: 11/01/20 Requesting Physician: Dr. Bonna Gains  PATIENT REVIEW QUESTIONS: The patient responded to the following health history questions as indicated:    1. Are you having any GI issues? no 2. Do you have a personal history of Polyps? no 3. Do you have a family history of Colon Cancer or Polyps? yes (Maternal Grandmother colon cancer) 4. Diabetes Mellitus? no 5. Joint replacements in the past 12 months?no 6. Major health problems in the past 3 months?no 7. Any artificial heart valves, MVP, or defibrillator?no    MEDICATIONS & ALLERGIES:    Patient reports the following regarding taking any anticoagulation/antiplatelet therapy:   Plavix, Coumadin, Eliquis, Xarelto, Lovenox, Pradaxa, Brilinta, or Effient? no Aspirin? yes (81 mg)  Patient confirms/reports the following medications:  Current Outpatient Medications  Medication Sig Dispense Refill   albuterol (PROAIR HFA) 108 (90 Base) MCG/ACT inhaler inhale 2 puffs by mouth every 4 to 6 hours if needed for SHORTNESS OF BREATH 18 g 6   b complex vitamins tablet Take 1 tablet by mouth daily.     budesonide-formoterol (SYMBICORT) 160-4.5 MCG/ACT inhaler Inhale 2 puffs into the lungs 2 (two) times daily. 3 each 3   busPIRone (BUSPAR) 10 MG tablet Take 1-1.5 tablets (10-15 mg total) by mouth 3 (three) times daily. 135 tablet 1   FLUoxetine (PROZAC) 20 MG capsule TAKE 1 CAPSULE(20 MG) BY MOUTH DAILY 90 capsule 1   Multiple Vitamin (MULTIVITAMIN WITH MINERALS) TABS tablet Take 1 tablet by mouth daily.     vitamin C (ASCORBIC ACID) 500 MG tablet Take 500 mg by mouth daily.     No current facility-administered medications for this visit.    Patient confirms/reports the following allergies:  Allergies  Allergen Reactions   Anoro Ellipta [Umeclidinium-Vilanterol] Diarrhea and Other (See Comments)    "achy all over"    No orders of the defined types were placed in this  encounter.   AUTHORIZATION INFORMATION Primary Insurance: 1D#: Group #:  Secondary Insurance: 1D#: Group #:  SCHEDULE INFORMATION: Date: 11/01/20 Time: Location: [place]

## 2020-10-29 ENCOUNTER — Telehealth: Payer: Self-pay | Admitting: Gastroenterology

## 2020-10-29 NOTE — Telephone Encounter (Signed)
Patient has to cancel procedure for this Friday 11/01/20 due to his work schedule. Will r/s at a later date.

## 2020-10-30 NOTE — Telephone Encounter (Signed)
Informed Endo unit patient has requested to cancel procedure for 11/01/20. Work schedule conflict.

## 2020-11-01 ENCOUNTER — Ambulatory Visit
Admission: RE | Admit: 2020-11-01 | Payer: BC Managed Care – PPO | Source: Home / Self Care | Admitting: Gastroenterology

## 2020-11-01 ENCOUNTER — Encounter: Admission: RE | Payer: Self-pay | Source: Home / Self Care

## 2020-11-01 SURGERY — COLONOSCOPY WITH PROPOFOL
Anesthesia: General

## 2020-11-11 ENCOUNTER — Other Ambulatory Visit: Payer: Self-pay | Admitting: Family Medicine

## 2020-11-11 NOTE — Telephone Encounter (Signed)
Requested Prescriptions  Pending Prescriptions Disp Refills  . busPIRone (BUSPAR) 10 MG tablet [Pharmacy Med Name: BUSPIRONE '10MG'$  TABLETS] 135 tablet 1    Sig: TAKE 1 TO 1 AND 1/2 TABLETS(10 TO 15 MG) BY MOUTH THREE TIMES DAILY     Psychiatry: Anxiolytics/Hypnotics - Non-controlled Passed - 11/11/2020  4:38 PM      Passed - Valid encounter within last 6 months    Recent Outpatient Visits          2 months ago Routine general medical examination at a health care facility   [facility] [place], DO   7 months ago Mild intermittent asthma without complication   [place], DO   9 months ago Mild intermittent asthma without complication   [place], [place] P, DO   1 year ago Routine general medical examination at a health care facility   [facility] [place], DO   2 years ago Generalized anxiety disorder   [facility] Valerie Roys, DO      Future Appointments            In 3 months Wynetta Emery, Barb Merino, DO [employer]

## 2020-11-15 ENCOUNTER — Ambulatory Visit: Payer: Self-pay | Admitting: *Deleted

## 2020-11-15 ENCOUNTER — Encounter: Payer: Self-pay | Admitting: Physician Assistant

## 2020-11-15 ENCOUNTER — Telehealth: Payer: BC Managed Care – PPO | Admitting: Physician Assistant

## 2020-11-15 DIAGNOSIS — U071 COVID-19: Secondary | ICD-10-CM

## 2020-11-15 MED ORDER — NIRMATRELVIR/RITONAVIR (PAXLOVID)TABLET: 3.0000 | ORAL_TABLET | Freq: Two times a day (BID) | ORAL | 0 refills | Status: AC

## 2020-11-15 MED ORDER — BENZONATATE 100 MG PO CAPS: 100.0000 mg | ORAL_CAPSULE | Freq: Three times a day (TID) | ORAL | 0 refills | Status: DC | PRN

## 2020-11-15 NOTE — Telephone Encounter (Signed)
Summary: +covid   Pt called and stated that he tested positive for covid via home test. Pt would like a call from the nurse to discuss. Pt states that he has asthma. Please advise     Attempted to call patient- left message to call office

## 2020-11-15 NOTE — Progress Notes (Signed)
Virtual Visit Consent   Christopher Davenport, you are scheduled for a virtual visit with a [facility] provider today.     Just as with appointments in the office, your consent must be obtained to participate.  Your consent will be active for this visit and any virtual visit you may have with one of our providers in the next 365 days.     If you have a MyChart account, a copy of this consent can be sent to you electronically.  All virtual visits are billed to your insurance company just like a traditional visit in the office.    As this is a virtual visit, video technology does not allow for your provider to perform a traditional examination.  This may limit your provider's ability to fully assess your condition.  If your provider identifies any concerns that need to be evaluated in person or the need to arrange testing (such as labs, EKG, etc.), we will make arrangements to do so.     Although advances in technology are sophisticated, we cannot ensure that it will always work on either your end or our end.  If the connection with a video visit is poor, the visit may have to be switched to a telephone visit.  With either a video or telephone visit, we are not always able to ensure that we have a secure connection.     I need to obtain your verbal consent now.   Are you willing to proceed with your visit today?    Christopher Davenport has provided verbal consent on 11/15/2020 for a virtual visit (video or telephone).   [name], PA-C   Date: 11/15/2020 12:50 PM   Virtual Visit via Video Note   Christopher Davenport, connected with  Christopher Davenport  ([id number], 1975-09-19) on 11/15/20 at 12:30 PM EDT by a video-enabled telemedicine application and verified that I am speaking with the correct person using two identifiers.  Location: Patient: Virtual Visit Location Patient: Home Provider: Virtual Visit Location Provider: Home Office   I discussed the limitations of evaluation and  management by telemedicine and the availability of in person appointments. The patient expressed understanding and agreed to proceed.    History of Present Illness: Christopher Davenport is a 45 y.o. who identifies as a male who was assigned male at birth, and is being seen today for Covid 66.  HPI: URI  This is a new problem. Episode onset: symptoms started late Monday; tested positive last night. The problem has been gradually worsening. There has been no fever. Associated symptoms include congestion, coughing (dry), nausea (improved), a plugged ear sensation, rhinorrhea, sinus pain and a sore throat. Pertinent negatives include no diarrhea, ear pain, headaches or vomiting. Associated symptoms comments: Body aches, fatigue, brain fog. Treatments tried: theraflu, ibuprofen. The treatment provided no relief.   PMH: Asthma  Problems:  Patient Active Problem List   Diagnosis Date Noted   Epididymal cyst 08/19/2016   Asthma 11/16/2014   Anxiety disorder 11/16/2014   Other headache syndrome 03/27/2014    Allergies:  Allergies  Allergen Reactions   Anoro Ellipta [Umeclidinium-Vilanterol] Diarrhea and Other (See Comments)    "achy all over"   Medications:  Current Outpatient Medications:    benzonatate (TESSALON) 100 MG capsule, Take 1 capsule (100 mg total) by mouth 3 (three) times daily as needed., Disp: 30 capsule, Rfl: 0   nirmatrelvir/ritonavir EUA (PAXLOVID) 20 x 150 MG & 10 x '100MG'$  TABS, Take 3  tablets by mouth 2 (two) times daily for 5 days. (Take nirmatrelvir 150 mg two tablets twice daily for 5 days and ritonavir 100 mg one tablet twice daily for 5 days) Patient GFR is 72, Disp: 30 tablet, Rfl: 0   albuterol (PROAIR HFA) 108 (90 Base) MCG/ACT inhaler, inhale 2 puffs by mouth every 4 to 6 hours if needed for SHORTNESS OF BREATH, Disp: 18 g, Rfl: 6   b complex vitamins tablet, Take 1 tablet by mouth daily., Disp: , Rfl:    budesonide-formoterol (SYMBICORT) 160-4.5 MCG/ACT inhaler, Inhale 2  puffs into the lungs 2 (two) times daily., Disp: 3 each, Rfl: 3   busPIRone (BUSPAR) 10 MG tablet, TAKE 1 TO 1 AND 1/2 TABLETS(10 TO 15 MG) BY MOUTH THREE TIMES DAILY, Disp: 135 tablet, Rfl: 1   FLUoxetine (PROZAC) 20 MG capsule, TAKE 1 CAPSULE(20 MG) BY MOUTH DAILY, Disp: 90 capsule, Rfl: 1   Multiple Vitamin (MULTIVITAMIN WITH MINERALS) TABS tablet, Take 1 tablet by mouth daily., Disp: , Rfl:    vitamin C (ASCORBIC ACID) 500 MG tablet, Take 500 mg by mouth daily., Disp: , Rfl:   Observations/Objective: Patient is well-developed, well-nourished in no acute distress.  Resting comfortably at home.  Head is normocephalic, atraumatic.  No labored breathing. Speech is clear and coherent with logical content.  Patient is alert and oriented at baseline.    Assessment and Plan: 1. COVID-19 - MyChart COVID-19 home monitoring program; Future - nirmatrelvir/ritonavir EUA (PAXLOVID) 20 x 150 MG & 10 x '100MG'$  TABS; Take 3 tablets by mouth 2 (two) times daily for 5 days. (Take nirmatrelvir 150 mg two tablets twice daily for 5 days and ritonavir 100 mg one tablet twice daily for 5 days) Patient GFR is 72  Dispense: 30 tablet; Refill: 0 - benzonatate (TESSALON) 100 MG capsule; Take 1 capsule (100 mg total) by mouth 3 (three) times daily as needed.  Dispense: 30 capsule; Refill: 0 - Continue OTC symptomatic management of choice - Will send OTC vitamins and supplement information through AVS - Paxlovid prescribed - Tessalon perles for cough - Patient enrolled in MyChart symptom monitoring - Push fluids - Rest as needed - Discussed return precautions and when to seek in-person evaluation, sent via [facility] as well  Follow Up Instructions: I discussed the assessment and treatment plan with the patient. The patient was provided an opportunity to ask questions and all were answered. The patient agreed with the plan and demonstrated an understanding of the instructions.  A copy of instructions were sent to the  patient via MyChart.  The patient was advised to call back or seek an in-person evaluation if the symptoms worsen or if the condition fails to improve as anticipated.  Time:  I spent 20 minutes with the patient via telehealth technology discussing the above problems/concerns.    [name], PA-C

## 2020-11-15 NOTE — Patient Instructions (Signed)
Hello [facility],  You are being placed in the home monitoring program for COVID-19 (commonly known as Coronavirus).  This is because you are suspected to have the virus or are known to have the virus.  If you are unsure which group you fall into call your clinic.    As part of this program, you'll answer a daily questionnaire in the MyChart mobile app. You'll receive a notification through the MyChart app when the questionnaire is available. When you log in to MyChart, you'll see the tasks in your To Do activity.       Clinicians will see any answers that are concerning and take appropriate steps.  If at any point you are having a medical emergency, call 911.  If otherwise concerned call your clinic instead of coming into the clinic or [place].  To keep from spreading the disease you should: Stay home and limit contact with other people as much as possible.  Wash your hands frequently. Cover your coughs and sneezes with a tissue, and throw used tissues in the trash.   Clean and disinfect frequently touched surfaces and objects.    Take care of yourself by: Staying home Resting Drinking fluids Take fever-reducing medications (Tylenol/Acetaminophen and Ibuprofen)  For more information on the disease go to the Centers for Disease Control and Prevention website     COVID-19: Quarantine and Isolation Quarantine If you were exposed Quarantine and stay away from others when you have been in close contact with someone who has COVID-19. Isolate If you are sick or test positive Isolate when you are sick or when you have COVID-19, even if you don't have symptoms. When to stay home Calculating quarantine The date of your exposure is considered day 0. Day 1 is the first full day after your last contact with a person who has had COVID-19. Stay home and away from other people for at least 5 days. Learn why CDC updated guidance for the general public. IF YOU were exposed to COVID-19 and are NOT   up to dateIF YOU were exposed to COVID-19 and are NOT on COVID-19 vaccinations Quarantine for at least 5 days Stay home Stay home and quarantine for at least 5 full days. Wear a well-fitting mask if you must be around others in your home. Do not travel. Get tested Even if you don't develop symptoms, get tested at least 5 days after you last had close contact with someone with COVID-19. After quarantine Watch for symptoms Watch for symptoms until 10 days after you last had close contact with someone with COVID-19. Avoid travel It is best to avoid travel until a full 10 days after you last had close contact with someone with COVID-19. If you develop symptoms Isolate immediately and get tested. Continue to stay home until you know the results. Wear a well-fitting mask around others. Take precautions until day 10 Wear a well-fitting mask Wear a well-fitting mask for 10 full days any time you are around others inside your home or in public. Do not go to places where you are unable to wear a well-fitting mask. If you must travel during days 6-10, take precautions. Avoid being around people who are more likely to get very sick from COVID-19. IF YOU were exposed to COVID-19 and are  up to dateIF YOU were exposed to COVID-19 and are on COVID-19 vaccinations No quarantine You do not need to stay home unless you develop symptoms. Get tested Even if you don't develop symptoms, get tested at  least 5 days after you last had close contact with someone with COVID-19. Watch for symptoms Watch for symptoms until 10 days after you last had close contact with someone with COVID-19. If you develop symptoms Isolate immediately and get tested. Continue to stay home until you know the results. Wear a well-fitting mask around others. Take precautions until day 10 Wear a well-fitting mask Wear a well-fitting mask for 10 full days any time you are around others inside your home or in public. Do not go to  places where you are unable to wear a well-fitting mask. Take precautions if traveling Avoid being around people who are more likely to get very sick from COVID-19. IF YOU were exposed to COVID-19 and had confirmed COVID-19 within the past 90 days (you tested positive using a viral test) No quarantine You do not need to stay home unless you develop symptoms. Watch for symptoms Watch for symptoms until 10 days after you last had close contact with someone with COVID-19. If you develop symptoms Isolate immediately and get tested. Continue to stay home until you know the results. Wear a well-fitting mask around others. Take precautions until day 10 Wear a well-fitting mask Wear a well-fitting mask for 10 full days any time you are around others inside your home or in public. Do not go to places where you are unable to wear a well-fitting mask. Take precautions if traveling Avoid being around people who are more likely to get very sick from COVID-19. Calculating isolation Day 0 is your first day of symptoms or a positive viral test. Day 1 is the first full day after your symptoms developed or your test specimen was collected. If you have COVID-19 or have symptoms, isolate for at least 5 days. IF YOU tested positive for COVID-19 or have symptoms, regardless of vaccination status Stay home for at least 5 days Stay home for 5 days and isolate from others in your home. Wear a well-fitting mask if you must be around others in your home. Do not travel. Ending isolation if you had symptoms End isolation after 5 full days if you are fever-free for 24 hours (without the use of fever-reducing medication) and your symptoms are improving. Ending isolation if you did NOT have symptoms End isolation after at least 5 full days after your positive test. If you got very sick from COVID-19 or have a weakened immune system You should isolate for at least 10 days. Consult your doctor before ending  isolation. Take precautions until day 10 Wear a well-fitting mask Wear a well-fitting mask for 10 full days any time you are around others inside your home or in public. Do not go to places where you are unable to wear a well-fitting mask. Do not travel Do not travel until a full 10 days after your symptoms started or the date your positive test was taken if you had no symptoms. Avoid being around people who are more likely to get very sick from COVID-19. Definitions Exposure Contact with someone infected with SARS-CoV-2, the virus that causes COVID-19, in a way that increases the likelihood of getting infected with the virus. Close contact A close contact is someone who was less than 6 feet away from an infected person (laboratory-confirmed or a clinical diagnosis) for a cumulative total of 15 minutes or more over a 24-hour period. For example, three individual 5-minute exposures for a total of 15 minutes. People who are exposed to someone with COVID-19 after they completed at least  5 days of isolation are not considered close contacts. [facility] [place] is a strategy used to prevent transmission of COVID-19 by keeping people who have been in close contact with someone with COVID-19 apart from others. Who does not need to quarantine? If you had close contact with someone with COVID-19 and you are in one of the following groups, you do not need to quarantine. You are up to date with your COVID-19 vaccines. You had confirmed COVID-19 within the last 90 days (meaning you tested positive using a viral test). If you are up to date with COVID-19 vaccines, you should wear a well-fitting mask around others for 10 days from the date of your last close contact with someone with COVID-19 (the date of last close contact is considered day 0). Get tested at least 5 days after you last had close contact with someone with COVID-19. If you test positive or develop COVID-19 symptoms, isolate from other people  and follow recommendations in the Isolation section below. If you tested positive for COVID-19 with a viral test within the previous 90 days and subsequently recovered and remain without COVID-19 symptoms, you do not need to quarantine or get tested after close contact. You should wear a well-fitting mask around others for 10 days from the date of your last close contact with someone with COVID-19 (the date of last close contact is considered day 0). If you have COVID-19 symptoms, get tested and isolate from other people and follow recommendations in the Isolation section below. Who should quarantine? If you come into close contact with someone with COVID-19, you should quarantine if you are not up to date on COVID-19 vaccines. This includes people who are not vaccinated. What to do for quarantine Stay home and away from other people for at least 5 days (day 0 through day 5) after your last contact with a person who has COVID-19. The date of your exposure is considered day 0. Wear a well-fitting mask when around others at home, if possible. For 10 days after your last close contact with someone with COVID-19, watch for fever (100.48F or greater), cough, shortness of breath, or other COVID-19 symptoms. If you develop symptoms, get tested immediately and isolate until you receive your test results. If you test positive, follow isolation recommendations. If you do not develop symptoms, get tested at least 5 days after you last had close contact with someone with COVID-19. If you test negative, you can leave your home, but continue to wear a well-fitting mask when around others at home and in public until 10 days after your last close contact with someone with COVID-19. If you test positive, you should isolate for at least 5 days from the date of your positive test (if you do not have symptoms). If you do develop COVID-19 symptoms, isolate for at least 5 days from the date your symptoms began (the date the  symptoms started is day 0). Follow recommendations in the isolation section below. If you are unable to get a test 5 days after last close contact with someone with COVID-19, you can leave your home after day 5 if you have been without COVID-19 symptoms throughout the 5-day period. Wear a well-fitting mask for 10 days after your date of last close contact when around others at home and in public. Avoid people who are have weakened immune systems or are more likely to get very sick from COVID-19, and nursing homes and other high-risk settings, until after at least 10 days. If  possible, stay away from people you live with, especially people who are at higher risk for getting very sick from COVID-19, as well as others outside your home throughout the full 10 days after your last close contact with someone with COVID-19. If you are unable to quarantine, you should wear a well-fitting mask for 10 days when around others at home and in public. If you are unable to wear a mask when around others, you should continue to quarantine for 10 days. Avoid people who have weakened immune systems or are more likely to get very sick from COVID-19, and nursing homes and other high-risk settings, until after at least 10 days. See additional information about travel. Do not go to places where you are unable to wear a mask, such as restaurants and some gyms, and avoid eating around others at home and at work until after 10 days after your last close contact with someone with COVID-19. After quarantine Watch for symptoms until 10 days after your last close contact with someone with COVID-19. If you have symptoms, isolate immediately and get tested. Quarantine in high-risk congregate settings In certain congregate settings that have high risk of secondary transmission (such as Systems analyst and detention facilities, homeless shelters, or cruise ships), CDC recommends a 10-day quarantine for residents, regardless of vaccination  and booster status. During periods of critical staffing shortages, facilities may consider shortening the quarantine period for staff to ensure continuity of operations. Decisions to shorten quarantine in these settings should be made in consultation with state, local, tribal, or territorial health departments and should take into consideration the context and characteristics of the facility. CDC's setting-specific guidance provides additional recommendations for these settings. Isolation Isolation is used to separate people with confirmed or suspected COVID-19 from those without COVID-19. People who are in isolation should stay home until it's safe for them to be around others. At home, anyone sick or infected should separate from others, or wear a well-fitting mask when they need to be around others. People in isolation should stay in a specific "sick room" or area and use a separate bathroom if available. Everyone who has presumed or confirmed COVID-19 should stay home and isolate from other people for at least 5 full days (day 0 is the first day of symptoms or the date of the day of the positive viral test for asymptomatic persons). They should wear a mask when around others at home and in public for an additional 5 days. People who are confirmed to have COVID-19 or are showing symptoms of COVID-19 need to isolate regardless of their vaccination status. This includes: People who have a positive viral test for COVID-19, regardless of whether or not they have symptoms. People with symptoms of COVID-19, including people who are awaiting test results or have not been tested. People with symptoms should isolate even if they do not know if they have been in close contact with someone with COVID-19. What to do for isolation Monitor your symptoms. If you have an emergency warning sign (including trouble breathing), seek emergency medical care immediately. Stay in a separate room from other household members, if  possible. Use a separate bathroom, if possible. Take steps to improve ventilation at home, if possible. Avoid contact with other members of the household and pets. Don't share personal household items, like cups, towels, and utensils. Wear a well-fitting mask when you need to be around other people. Learn more about what to do if you are sick and how to notify your  contacts. Ending isolation for people who had COVID-19 and had symptoms If you had COVID-19 and had symptoms, isolate for at least 5 days. To calculate your 5-day isolation period, day 0 is your first day of symptoms. Day 1 is the first full day after your symptoms developed. You can leave isolation after 5 full days. You can end isolation after 5 full days if you are fever-free for 24 hours without the use of fever-reducing medication and your other symptoms have improved (Loss of taste and smell may persist for weeks or months after recovery and need not delay the end of isolation). You should continue to wear a well-fitting mask around others at home and in public for 5 additional days (day 6 through day 10) after the end of your 5-day isolation period. If you are unable to wear a mask when around others, you should continue to isolate for a full 10 days. Avoid people who have weakened immune systems or are more likely to get very sick from COVID-19, and nursing homes and other high-risk settings, until after at least 10 days. If you continue to have fever or your other symptoms have not improved after 5 days of isolation, you should wait to end your isolation until you are fever-free for 24 hours without the use of fever-reducing medication and your other symptoms have improved. Continue to wear a well-fitting mask through day 10. Contact your healthcare provider if you have questions. See additional information about travel. Do not go to places where you are unable to wear a mask, such as restaurants and some gyms, and avoid eating  around others at home and at work until a full 10 days after your first day of symptoms. If an individual has access to a test and wants to test, the best approach is to use an antigen test1 towards the end of the 5-day isolation period. Collect the test sample only if you are fever-free for 24 hours without the use of fever-reducing medication and your other symptoms have improved (loss of taste and smell may persist for weeks or months after recovery and need not delay the end of isolation). If your test result is positive, you should continue to isolate until day 10. If your test result is negative, you can end isolation, but continue to wear a well-fitting mask around others at home and in public until day 10. Follow additional recommendations for masking and avoiding travel as described above. 1As noted in the labeling for authorized over-the counter antigen tests: Negative results should be treated as presumptive. Negative results do not rule out SARS-CoV-2 infection and should not be used as the sole basis for treatment or patient management decisions, including infection control decisions. To improve results, antigen tests should be used twice over a three-day period with at least 24 hours and no more than 48 hours between tests. Note that these recommendations on ending isolation do not apply to people who are moderately ill or very sick from COVID-19 or have weakened immune systems. See section below for recommendations for when to end isolation for these groups. Ending isolation for people who tested positive for COVID-19 but had no symptoms If you test positive for COVID-19 and never develop symptoms, isolate for at least 5 days. Day 0 is the day of your positive viral test (based on the date you were tested) and day 1 is the first full day after the specimen was collected for your positive test. You can leave isolation after 5  full days. If you continue to have no symptoms, you can end isolation  after at least 5 days. You should continue to wear a well-fitting mask around others at home and in public until day 10 (day 6 through day 10). If you are unable to wear a mask when around others, you should continue to isolate for 10 days. Avoid people who have weakened immune systems or are more likely to get very sick from COVID-19, and nursing homes and other high-risk settings, until after at least 10 days. If you develop symptoms after testing positive, your 5-day isolation period should start over. Day 0 is your first day of symptoms. Follow the recommendations above for ending isolation for people who had COVID-19 and had symptoms. See additional information about travel. Do not go to places where you are unable to wear a mask, such as restaurants and some gyms, and avoid eating around others at home and at work until 10 days after the day of your positive test. If an individual has access to a test and wants to test, the best approach is to use an antigen test1 towards the end of the 5-day isolation period. If your test result is positive, you should continue to isolate until day 10. If your test result is positive, you can also choose to test daily and if your test result is negative, you can end isolation, but continue to wear a well-fitting mask around others at home and in public until day 10. Follow additional recommendations for masking and avoiding travel as described above. 1As noted in the labeling for authorized over-the counter antigen tests: Negative results should be treated as presumptive. Negative results do not rule out SARS-CoV-2 infection and should not be used as the sole basis for treatment or patient management decisions, including infection control decisions. To improve results, antigen tests should be used twice over a three-day period with at least 24 hours and no more than 48 hours between tests. Ending isolation for people who were moderately or very sick from COVID-19 or  have a weakened immune system People who are moderately ill from COVID-19 (experiencing symptoms that affect the lungs like shortness of breath or difficulty breathing) should isolate for 10 days and follow all other isolation precautions. To calculate your 10-day isolation period, day 0 is your first day of symptoms. Day 1 is the first full day after your symptoms developed. If you are unsure if your symptoms are moderate, talk to a healthcare provider for further guidance. People who are very sick from COVID-19 (this means people who were hospitalized or required intensive care or ventilation support) and people who have weakened immune systems might need to isolate at home longer. They may also require testing with a viral test to determine when they can be around others. CDC recommends an isolation period of at least 10 and up to 20 days for people who were very sick from COVID-19 and for people with weakened immune systems. Consult with your healthcare provider about when you can resume being around other people. If you are unsure if your symptoms are severe or if you have a weakened immune system, talk to a healthcare provider for further guidance. People who have a weakened immune system should talk to their healthcare provider about the potential for reduced immune responses to COVID-19 vaccines and the need to continue to follow current prevention measures (including wearing a well-fitting mask and avoiding crowds and poorly ventilated indoor spaces) to protect themselves against COVID-19  until advised otherwise by their healthcare provider. Close contacts of immunocompromised people--including household members--should also be encouraged to receive all recommended COVID-19 vaccine doses to help protect these people. Isolation in high-risk congregate settings In certain high-risk congregate settings that have high risk of secondary transmission and where it is not feasible to cohort people (such as  [employer]s analyst and detention facilities, homeless shelters, and cruise ships), CDC recommends a 10-day isolation period for residents. During periods of critical staffing shortages, facilities may consider shortening the isolation period for staff to ensure continuity of operations. Decisions to shorten isolation in these settings should be made in consultation with state, local, tribal, or territorial health departments and should take into consideration the context and characteristics of the facility. CDC's setting-specific guidance provides additional recommendations for these settings. This CDC guidance is meant to supplement--not replace--any federal, state, local, territorial, or tribal health and safety laws, rules, and regulations. Recommendations for specific settings These recommendations do not apply to healthcare professionals. For guidance specific to these settings, see Healthcare professionals: Interim Guidance for Optician, dispensing with SARS-CoV-2 Infection or Exposure to SARS-CoV-2 Patients, residents, and visitors to healthcare settings: Interim Infection Prevention and Control Recommendations for Healthcare Personnel During the [facility] 2019 (COVID-19) Pandemic Additional setting-specific guidance and recommendations are available. These recommendations on quarantine and isolation do apply to [facility] [place] settings. Additional guidance is available here: Overview of COVID-19 Quarantine for K-12 Schools Travelers: Travel information and recommendations Congregate facilities and other settings: [employer] for community, work, and school settings Ongoing COVID-19 exposure FAQs I live with someone with COVID-19, but I cannot be separated from them. How do we manage quarantine in this situation? It is very important for people with COVID-19 to remain apart from other people, if possible, even if they are living together. If separation of the person with COVID-19 from  others that they live with is not possible, the other people that they live with will have ongoing exposure, meaning they will be repeatedly exposed until that person is no longer able to spread the virus to other people. In this situation, there are precautions you can take to limit the spread of COVID-19: The person with COVID-19 and everyone they live with should wear a well-fitting mask inside the home. If possible, one person should care for the person with COVID-19 to limit the number of people who are in close contact with the infected person. Take steps to protect yourself and others to reduce transmission in the home: Quarantine if you are not up to date with your COVID-19 vaccines. Isolate if you are sick or tested positive for COVID-19, even if you don't have symptoms. Learn more about the public health recommendations for testing, mask use and quarantine of close contacts, like yourself, who have ongoing exposure. These recommendations differ depending on your vaccination status. What should I do if I have ongoing exposure to COVID-19 from someone I live with? Recommendations for this situation depend on your vaccination status: If you are not up to date on COVID-19 vaccines and have ongoing exposure to COVID-19, you should: Begin quarantine immediately and continue to quarantine throughout the isolation period of the person with COVID-19. Continue to quarantine for an additional 5 days starting the day after the end of isolation for the person with COVID-19. Get tested at least 5 days after the end of isolation of the infected person that lives with them. If you test negative, you can leave the home but should continue to wear  a well-fitting mask when around others at home and in public until 10 days after the end of isolation for the person with COVID-19. Isolate immediately if you develop symptoms of COVID-19 or test positive. If you are up to date with COVID-19 vaccines and have  ongoing exposure to COVID-19, you should: Get tested at least 5 days after your first exposure. A person with COVID-19 is considered infectious starting 2 days before they develop symptoms, or 2 days before the date of their positive test if they do not have symptoms. Get tested again at least 5 days after the end of isolation for the person with COVID-19. Wear a well-fitting mask when you are around the person with COVID-19, and do this throughout their isolation period. Wear a well-fitting mask around others for 10 days after the infected person's isolation period ends. Isolate immediately if you develop symptoms of COVID-19 or test positive. What should I do if multiple people I live with test positive for COVID-19 at different times? Recommendations for this situation depend on your vaccination status: If you are not up to date with your COVID-19 vaccines, you should: Quarantine throughout the isolation period of any infected person that you live with. Continue to quarantine until 5 days after the end of isolation date for the most recently infected person that lives with you. For example, if the last day of isolation of the person most recently infected with COVID-19 was June 30, the new 5-day quarantine period starts on July 1. Get tested at least 5 days after the end of isolation for the most recently infected person that lives with you. Wear a well-fitting mask when you are around any person with COVID-19 while that person is in isolation. Wear a well-fitting mask when you are around other people until 10 days after your last close contact. Isolate immediately if you develop symptoms of COVID-19 or test positive. If you are up to date with your COVID-19 vaccines, you should: Get tested at least 5 days after your first exposure. A person with COVID-19 is considered infectious starting 2 days before they developed symptoms, or 2 days before the date of their positive test if they do not have  symptoms. Get tested again at least 5 days after the end of isolation for the most recently infected person that lives with you. Wear a well-fitting mask when you are around any person with COVID-19 while that person is in isolation. Wear a well-fitting mask around others for 10 days after the end of isolation for the most recently infected person that lives with you. For example, if the last day of isolation for the person most recently infected with COVID-19 was June 30, the new 10-day period to wear a well-fitting mask indoors in public starts on July 1. Isolate immediately if you develop symptoms of COVID-19 or test positive. I had COVID-19 and completed isolation. Do I have to quarantine or get tested if someone I live with gets COVID-19 shortly after I completed isolation? No. If you recently completed isolation and someone that lives with you tests positive for the virus that causes COVID-19 shortly after the end of your isolation period, you do not have to quarantine or get tested as long as you do not develop new symptoms. Once all of the people that live together have completed isolation or quarantine, refer to the guidance below for new exposures to COVID-19. If you had COVID-19 in the previous 90 days and then came into close contact with  someone with COVID-19, you do not have to quarantine or get tested if you do not have symptoms. But you should: Wear a well-fitting mask indoors in public for 10 days after your last close contact. Monitor for COVID-19 symptoms for 10 days from the date of your last close contact. Isolate immediately and get tested if symptoms develop. If more than 90 days have passed since your recovery from infection, follow CDC's recommendations for close contacts. These recommendations will differ depending on your vaccination status. 06/19/2020 Content source: [facility] ([facility]), Division of Viral Diseases This  information is not intended to replace advice given to you by your health care provider. Make sure you discuss any questions you have with your health care provider. Document Revised: 07/25/2020 Document Reviewed: 07/25/2020 Elsevier Patient Education  2022 [place] are being prescribed PAXLOVID for COVID-19 infection.   Please call the pharmacy or go through the drive through vs going inside if you are picking up the mediation yourself to prevent further spread. If prescribed to a [facility] affiliated pharmacy, a pharmacist will bring the medication out to your car.   Medications to hold while taking this treatment: None  *If asked to hold, you can resume them 24 hours after your last dose   ADMINISTRATION INSTRUCTIONS: Take with or without food. Swallow the tablets whole. Don't chew, crush, or break the medications because it might not work as well  For each dose of the medication, you should be taking 3 tablets together (2 pink oval and 1 white oval) TWICE a day for FIVE days   Finish your full five-day course of Paxlovid even if you feel better before you're done. Stopping this medication too early can make it less effective to prevent severe illness related to [facility].    Paxlovid is prescribed for YOU ONLY. Don't share it with others, even if they have similar symptoms as you. This medication might not be right for everyone.  Make sure to take steps to protect yourself and others while you're taking this medication in order to get well soon and to prevent others from getting sick with COVID-19.  Paxlovid (nirmatrelvir / ritonavir) can cause hormonal birth control medications to not work well. If you or your partner is currently taking hormonal birth control, use condoms or other birth control methods to prevent unintended pregnancies.    COMMON SIDE EFFECTS: Altered or bad taste in your mouth  Diarrhea  High blood pressure (1% of people) Muscle aches (1% of  people)     If your COVID-19 symptoms get worse, get medical help right away. Call 911 if you experience symptoms such as worsening cough, trouble breathing, chest pain that doesn't go away, confusion, a hard time staying awake, and pale or blue-colored skin. This medication won't prevent all COVID-19 cases from getting worse.   Can take to lessen severity: Vit C '500mg'$  twice daily Quercertin 250-'500mg'$  twice daily Zinc 75-'100mg'$  daily Melatonin 3-6 mg at bedtime Vit D3 1000-2000 IU daily Aspirin 81 mg daily with food Optional: Famotidine '20mg'$  daily Also can add tylenol/ibuprofen as needed for fevers and body aches May add Mucinex or Mucinex DM as needed for cough/congestion  10 Things You Can Do to Manage Your COVID-19 Symptoms at Home If you have possible or confirmed COVID-19 Stay home except to get medical care. Monitor your symptoms carefully. If your symptoms get worse, call your healthcare provider immediately. Get rest and stay hydrated. If you  have a medical appointment, call the healthcare provider ahead of time and tell them that you have or may have COVID-19. For medical emergencies, call 911 and notify the dispatch personnel that you have or may have COVID-19. Cover your cough and sneezes with a tissue or use the inside of your elbow. Wash your hands often with soap and water for at least 20 seconds or clean your hands with an alcohol-based hand sanitizer that contains at least 60% alcohol. As much as possible, stay in a specific room and away from other people in your home. Also, you should use a separate bathroom, if available. If you need to be around other people in or outside of the home, wear a mask. Avoid sharing personal items with other people in your household, like dishes, towels, and bedding. Clean all surfaces that are touched often, like counters, tabletops, and doorknobs. Use household cleaning sprays or wipes according to the label  instructions. [email or web address] 10/06/2019 This information is not intended to replace advice given to you by your health care provider. Make sure you discuss any questions you have with your health care provider. Document Revised: 07/25/2020 Document Reviewed: 07/25/2020 Elsevier Patient Education  [place].

## 2020-11-15 NOTE — Telephone Encounter (Signed)
Pt. Started having symptoms Tuesday. Fatigue, dry cough, body aches. Home test positive for COVID 19. No availability in the practice. Pt. Will try tele health visit through [facility].    Reason for Disposition  MILD difficulty breathing (e.g., minimal/no SOB at rest, SOB with walking, pulse <100)  Answer Assessment - Initial Assessment Questions 1. COVID-19 DIAGNOSIS: "Who made your COVID-19 diagnosis?" "Was it confirmed by a positive lab test or self-test?" If not diagnosed by a doctor (or NP/PA), ask "Are there lots of cases (community spread) where you live?" Note: See public health department website, if unsure.     Home 2. COVID-19 EXPOSURE: "Was there any known exposure to COVID before the symptoms began?" CDC Definition of close contact: within 6 feet (2 meters) for a total of 15 minutes or more over a 24-hour period.      No 3. ONSET: "When did the COVID-19 symptoms start?"      Tuesday 4. WORST SYMPTOM: "What is your worst symptom?" (e.g., cough, fever, shortness of breath, muscle aches)     Cough 5. COUGH: "Do you have a cough?" If Yes, ask: "How bad is the cough?"       Yes 6. FEVER: "Do you have a fever?" If Yes, ask: "What is your temperature, how was it measured, and when did it start?"     No 7. RESPIRATORY STATUS: "Describe your breathing?" (e.g., shortness of breath, wheezing, unable to speak)      No 8. BETTER-SAME-WORSE: "Are you getting better, staying the same or getting worse compared to yesterday?"  If getting worse, ask, "In what way?"     Better 9. HIGH RISK DISEASE: "Do you have any chronic medical problems?" (e.g., asthma, heart or lung disease, weak immune system, obesity, etc.)     Asthma 10. VACCINE: "Have you had the COVID-19 vaccine?" If Yes, ask: "Which one, how many shots, when did you get it?"       Yes 11. BOOSTER: "Have you received your COVID-19 booster?" If Yes, ask: "Which one and when did you get it?"       X 1 12. PREGNANCY: "Is there any  chance you are pregnant?" "When was your last menstrual period?"       N/A 13. OTHER SYMPTOMS: "Do you have any other symptoms?"  (e.g., chills, fatigue, headache, loss of smell or taste, muscle pain, sore throat)       Brain fog, achy, fatigue 14. O2 SATURATION MONITOR:  "Do you use an oxygen saturation monitor (pulse oximeter) at home?" If Yes, ask "What is your reading (oxygen level) today?" "What is your usual oxygen saturation reading?" (e.g., 95%)       No  Protocols used: Coronavirus (COVID-19) Diagnosed or Suspected-A-AH

## 2020-11-18 NOTE — Telephone Encounter (Signed)
Routing to provider as an FYI

## 2020-11-28 ENCOUNTER — Telehealth: Payer: Self-pay

## 2020-11-28 NOTE — Telephone Encounter (Signed)
BPA triggered for worsening symptoms SOB. Patient called, left VM to return the call to [phone] to discuss symptoms with a nurse.

## 2021-02-17 ENCOUNTER — Encounter: Payer: Self-pay | Admitting: Family Medicine

## 2021-02-17 ENCOUNTER — Ambulatory Visit (INDEPENDENT_AMBULATORY_CARE_PROVIDER_SITE_OTHER): Payer: BLUE CROSS/BLUE SHIELD | Admitting: Family Medicine

## 2021-02-17 ENCOUNTER — Other Ambulatory Visit: Payer: Self-pay

## 2021-02-17 VITALS — BP 122/85 | HR 68 | Temp 97.5°F | Ht 73.0 in | Wt 223.2 lb

## 2021-02-17 DIAGNOSIS — F411 Generalized anxiety disorder: Secondary | ICD-10-CM | POA: Diagnosis not present

## 2021-02-17 DIAGNOSIS — J4520 Mild intermittent asthma, uncomplicated: Secondary | ICD-10-CM | POA: Diagnosis not present

## 2021-02-17 DIAGNOSIS — Z23 Encounter for immunization: Secondary | ICD-10-CM | POA: Diagnosis not present

## 2021-02-17 MED ORDER — FLUOXETINE HCL 20 MG PO CAPS: ORAL_CAPSULE | ORAL | 1 refills | Status: DC

## 2021-02-17 MED ORDER — ALBUTEROL SULFATE HFA 108 (90 BASE) MCG/ACT IN AERS: INHALATION_SPRAY | RESPIRATORY_TRACT | 6 refills | Status: DC

## 2021-02-17 MED ORDER — BUSPIRONE HCL 10 MG PO TABS: ORAL_TABLET | ORAL | 1 refills | Status: DC

## 2021-02-17 NOTE — Assessment & Plan Note (Signed)
Under good control on current regimen. Continue current regimen. Continue to monitor. Call with any concerns. Refills given.   

## 2021-02-17 NOTE — Progress Notes (Signed)
BP 122/85   Pulse 68   Temp (!) 97.5 F (36.4 C)   Ht 6\' 1"  (1.854 m)   Wt 223 lb 3.2 oz (101.2 kg)   SpO2 97%   BMI 29.45 kg/m    Subjective:    Patient ID: [facility], male    DOB: 11-08-1975, 45 y.o.   MRN: [id number]  HPI: [facility] is a 45 y.o. male  Chief Complaint  Patient presents with   Asthma   Anxiety   ASTHMA Asthma status: exacerbated since COVID Satisfied with current treatment?: no Albuterol/rescue inhaler frequency: daily Dyspnea frequency: daily Wheezing frequency: daily Cough frequency: rarely Nocturnal symptom frequency: every night Limitation of activity: yes Current upper respiratory symptoms: no Aerochamber/spacer use: no Visits to ER or Urgent Care in past year: no Pneumovax: Up to Date Influenza: Up to Date  ANXIETY/STRESS Duration:chronic Anxious mood: yes  Excessive worrying: yes Irritability: yes  Sweating: no Nausea: no Palpitations:no Hyperventilation: no Panic attacks: no Agoraphobia: no  Obscessions/compulsions: no Depressed mood: no Depression screen [facility] 2/9 02/17/2021 08/15/2020 01/22/2020 02/24/2019 07/21/2018  Decreased Interest 1 0 0 0 0  Down, Depressed, Hopeless 1 0 0 0 0  PHQ - 2 Score 2 0 0 0 0  Altered sleeping 2 3 3 2  0  Tired, decreased energy 1 3 3 1 1   Change in appetite 1 0 0 1 0  Feeling bad or failure about yourself  0 0 0 0 0  Trouble concentrating 1 1 1 1 1   Moving slowly or fidgety/restless 0 0 1 0 0  Suicidal thoughts 0 0 0 0 0  PHQ-9 Score 7 7 8 5 2   Difficult doing work/chores - Not difficult at all Not difficult at all Not difficult at all -   GAD 7 : Generalized Anxiety Score 02/17/2021 08/15/2020 01/22/2020 07/21/2018  Nervous, Anxious, on Edge 3 1 2 2   Control/stop worrying 2 1 2  0  Worry too much - different things 2 1 2  0  Trouble relaxing 2 1 3  0  Restless 0 0 0 0  Easily annoyed or irritable 1 1 1 2   Afraid - awful might happen 0 0 0 0  Total GAD 7 Score 10 5 10 4   Anxiety  Difficulty Somewhat difficult Not difficult at all Not difficult at all Not difficult at all   Anhedonia: no Weight changes: no Insomnia: no hard to stay asleep  Hypersomnia: no Fatigue/loss of energy: yes Feelings of worthlessness: no Feelings of guilt: no Impaired concentration/indecisiveness: no Suicidal ideations: no  Crying spells: no Recent Stressors/Life Changes: no   Relationship problems: no   Family stress: yes     Financial stress: no    Job stress: yes    Recent death/loss: no  Relevant past medical, surgical, family and social history reviewed and updated as indicated. Interim medical history since our last visit reviewed. Allergies and medications reviewed and updated.  Review of Systems  Constitutional:  Positive for fatigue. Negative for activity change, appetite change, chills, diaphoresis, fever and unexpected weight change.  HENT: Negative.    Respiratory:  Positive for cough, shortness of breath and wheezing. Negative for apnea, choking, chest tightness and stridor.   Cardiovascular: Negative.   Gastrointestinal: Negative.   Musculoskeletal: Negative.   Psychiatric/Behavioral: Negative.     Per HPI unless specifically indicated above     Objective:    BP 122/85   Pulse 68   Temp (!) 97.5 F (36.4 C)  Ht '6\' 1"'$  (1.854 m)   Wt 223 lb 3.2 oz (101.2 kg)   SpO2 97%   BMI 29.45 kg/m   Wt Readings from Last 3 Encounters:  02/17/21 223 lb 3.2 oz (101.2 kg)  08/15/20 226 lb (102.5 kg)  01/22/20 224 lb 6.4 oz (101.8 kg)    Physical Exam Vitals and nursing note reviewed.  Constitutional:      General: He is not in acute distress.    Appearance: Normal appearance. He is not ill-appearing, toxic-appearing or diaphoretic.  HENT:     Head: Normocephalic and atraumatic.     Right Ear: External ear normal.     Left Ear: External ear normal.     Nose: Nose normal.     Mouth/Throat:     Mouth: Mucous membranes are moist.     Pharynx: Oropharynx is clear.   Eyes:     General: No scleral icterus.       Right eye: No discharge.        Left eye: No discharge.     Extraocular Movements: Extraocular movements intact.     Conjunctiva/sclera: Conjunctivae normal.     Pupils: Pupils are equal, round, and reactive to light.  Cardiovascular:     Rate and Rhythm: Normal rate and regular rhythm.     Pulses: Normal pulses.     Heart sounds: Normal heart sounds. No murmur heard.   No friction rub. No gallop.  Pulmonary:     Effort: Pulmonary effort is normal. No respiratory distress.     Breath sounds: Normal breath sounds. No stridor. No wheezing, rhonchi or rales.  Chest:     Chest wall: No tenderness.  Musculoskeletal:        General: Normal range of motion.     Cervical back: Normal range of motion and neck supple.  Skin:    General: Skin is warm and dry.     Capillary Refill: Capillary refill takes less than 2 seconds.     Coloration: Skin is not jaundiced or pale.     Findings: No bruising, erythema, lesion or rash.  Neurological:     General: No focal deficit present.     Mental Status: He is alert and oriented to person, place, and time. Mental status is at baseline.  Psychiatric:        Mood and Affect: Mood normal.        Behavior: Behavior normal.        Thought Content: Thought content normal.        Judgment: Judgment normal.    Results for orders placed or performed in visit on 08/15/20  Comprehensive metabolic panel  Result Value Ref Range   Glucose 86 65 - 99 mg/dL   BUN 18 6 - 24 mg/dL   Creatinine, Ser 1.25 0.76 - 1.27 mg/dL   eGFR 71 >59 mL/min/1.73   BUN/Creatinine Ratio 14 9 - 20   Sodium 140 134 - 144 mmol/L   Potassium 4.2 3.5 - 5.2 mmol/L   Chloride 103 96 - 106 mmol/L   CO2 21 20 - 29 mmol/L   Calcium 9.5 8.7 - 10.2 mg/dL   Total Protein 6.9 6.0 - 8.5 g/dL   Albumin 4.6 4.0 - 5.0 g/dL   Globulin, Total 2.3 1.5 - 4.5 g/dL   Albumin/Globulin Ratio 2.0 1.2 - 2.2   Bilirubin Total 0.6 0.0 - 1.2 mg/dL    Alkaline Phosphatase 66 44 - 121 IU/L   AST 18 0 - 40  IU/L   ALT 18 0 - 44 IU/L  CBC with Differential/Platelet  Result Value Ref Range   WBC 4.8 3.4 - 10.8 x10E3/uL   RBC 4.38 4.14 - 5.80 x10E6/uL   Hemoglobin 14.4 13.0 - 17.7 g/dL   Hematocrit 42.8 37.5 - 51.0 %   MCV 98 (H) 79 - 97 fL   MCH 32.9 26.6 - 33.0 pg   MCHC 33.6 31.5 - 35.7 g/dL   RDW 12.9 11.6 - 15.4 %   Platelets 221 150 - 450 x10E3/uL   Neutrophils 53 Not Estab. %   Lymphs 30 Not Estab. %   Monocytes 9 Not Estab. %   Eos 7 Not Estab. %   Basos 1 Not Estab. %   Neutrophils Absolute 2.5 1.4 - 7.0 x10E3/uL   Lymphocytes Absolute 1.4 0.7 - 3.1 x10E3/uL   Monocytes Absolute 0.4 0.1 - 0.9 x10E3/uL   EOS (ABSOLUTE) 0.4 0.0 - 0.4 x10E3/uL   Basophils Absolute 0.1 0.0 - 0.2 x10E3/uL   Immature Granulocytes 0 Not Estab. %   Immature Grans (Abs) 0.0 0.0 - 0.1 x10E3/uL  Lipid Panel w/o Chol/HDL Ratio  Result Value Ref Range   Cholesterol, Total 228 (H) 100 - 199 mg/dL   Triglycerides 135 0 - 149 mg/dL   HDL 49 >39 mg/dL   VLDL Cholesterol Cal 24 5 - 40 mg/dL   LDL Chol Calc (NIH) 155 (H) 0 - 99 mg/dL  TSH  Result Value Ref Range   TSH 2.910 0.450 - 4.500 uIU/mL  Urinalysis, Routine w reflex microscopic  Result Value Ref Range   Specific Gravity, UA 1.020 1.005 - 1.030   pH, UA 6.0 5.0 - 7.5   Color, UA Yellow Yellow   Appearance Ur Clear Clear   Leukocytes,UA Negative Negative   Protein,UA Negative Negative/Trace   Glucose, UA Negative Negative   Ketones, UA Negative Negative   RBC, UA Negative Negative   Bilirubin, UA Negative Negative   Urobilinogen, Ur 0.2 0.2 - 1.0 mg/dL   Nitrite, UA Negative Negative  Hepatitis C Antibody  Result Value Ref Range   Hep C Virus Ab <0.1 0.0 - 0.9 s/co ratio  PSA  Result Value Ref Range   Prostate Specific Ag, Serum 0.5 0.0 - 4.0 ng/mL      Assessment & Plan:   Problem List Items Addressed This Visit       Respiratory   Asthma - Primary    In mild exacerbation  due to covid. Not using symbicort as prescribed. Will increase to 2 puffs BID and recheck in 1 month with spiro. If not getting better add spiriva.       Relevant Medications   albuterol (PROAIR HFA) 108 (90 Base) MCG/ACT inhaler   Other Relevant Orders   CBC with Differential/Platelet   Comprehensive metabolic panel     Other   Anxiety disorder    Under good control on current regimen. Continue current regimen. Continue to monitor. Call with any concerns. Refills given.        Relevant Medications   FLUoxetine (PROZAC) 20 MG capsule   busPIRone (BUSPAR) 10 MG tablet   Other Visit Diagnoses     Need for influenza vaccination       Relevant Orders   Flu Vaccine QUAD 6+ mos PF IM (Fluarix Quad PF) (Completed)        Follow up plan: Return in about 4 weeks (around 03/17/2021) for with spiro.

## 2021-02-17 NOTE — Assessment & Plan Note (Signed)
In mild exacerbation due to covid. Not using symbicort as prescribed. Will increase to 2 puffs BID and recheck in 1 month with spiro. If not getting better add spiriva.

## 2021-02-18 LAB — CBC WITH DIFFERENTIAL/PLATELET
Basophils Absolute: 0 10*3/uL (ref 0.0–0.2)
Basos: 1 %
EOS (ABSOLUTE): 0.5 10*3/uL — ABNORMAL HIGH (ref 0.0–0.4)
Eos: 9 %
Hematocrit: 41.3 % (ref 37.5–51.0)
Hemoglobin: 13.8 g/dL (ref 13.0–17.7)
Immature Grans (Abs): 0 10*3/uL (ref 0.0–0.1)
Immature Granulocytes: 0 %
Lymphocytes Absolute: 2.2 10*3/uL (ref 0.7–3.1)
Lymphs: 39 %
MCH: 32.5 pg (ref 26.6–33.0)
MCHC: 33.4 g/dL (ref 31.5–35.7)
MCV: 97 fL (ref 79–97)
Monocytes Absolute: 0.5 10*3/uL (ref 0.1–0.9)
Monocytes: 8 %
Neutrophils Absolute: 2.5 10*3/uL (ref 1.4–7.0)
Neutrophils: 43 %
Platelets: 200 10*3/uL (ref 150–450)
RBC: 4.24 x10E6/uL (ref 4.14–5.80)
RDW: 12.8 % (ref 11.6–15.4)
WBC: 5.6 10*3/uL (ref 3.4–10.8)

## 2021-02-18 LAB — COMPREHENSIVE METABOLIC PANEL
ALT: 25 IU/L (ref 0–44)
AST: 21 IU/L (ref 0–40)
Albumin/Globulin Ratio: 2 (ref 1.2–2.2)
Albumin: 4.2 g/dL (ref 4.0–5.0)
Alkaline Phosphatase: 76 IU/L (ref 44–121)
BUN/Creatinine Ratio: 17 (ref 9–20)
BUN: 17 mg/dL (ref 6–24)
Bilirubin Total: <0.2 mg/dL (ref 0.0–1.2)
CO2: 24 mmol/L (ref 20–29)
Calcium: 8.9 mg/dL (ref 8.7–10.2)
Chloride: 105 mmol/L (ref 96–106)
Creatinine, Ser: 1 mg/dL (ref 0.76–1.27)
Globulin, Total: 2.1 g/dL (ref 1.5–4.5)
Glucose: 94 mg/dL (ref 70–99)
Potassium: 4 mmol/L (ref 3.5–5.2)
Sodium: 142 mmol/L (ref 134–144)
Total Protein: 6.3 g/dL (ref 6.0–8.5)
eGFR: 93 mL/min/{1.73_m2} (ref >59)

## 2021-02-26 ENCOUNTER — Other Ambulatory Visit: Payer: Self-pay | Admitting: Family Medicine

## 2021-02-26 NOTE — Telephone Encounter (Signed)
Requested Prescriptions  Pending Prescriptions Disp Refills  . SYMBICORT 160-4.5 MCG/ACT inhaler [Pharmacy Med Name: SYMBICORT 160/4.5MCG (120 ORAL INH)] 30.6 g 0    Sig: INHALE 2 PUFFS INTO THE LUNGS TWICE DAILY.     Pulmonology:  Combination Products Passed - 02/26/2021  9:33 AM      Passed - Valid encounter within last 12 months    Recent Outpatient Visits          1 week ago Mild intermittent asthma without complication   [place], Megan P, DO   6 months ago Routine general medical examination at a health care facility   [facility], Ravalli P, DO   11 months ago Mild intermittent asthma without complication   [facility]ury, DO   1 year ago Mild intermittent asthma without complication   [facility] Valerie Roys, DO   2 years ago Routine general medical examination at a health care facility   [facility], Barb Merino, DO      Future Appointments            In 4 [place] [id number] [facility], [facility]

## 2021-03-19 ENCOUNTER — Ambulatory Visit: Payer: Self-pay | Admitting: *Deleted

## 2021-03-19 ENCOUNTER — Telehealth: Payer: BC Managed Care – PPO | Admitting: Physician Assistant

## 2021-03-19 DIAGNOSIS — R051 Acute cough: Secondary | ICD-10-CM | POA: Diagnosis not present

## 2021-03-19 DIAGNOSIS — R062 Wheezing: Secondary | ICD-10-CM | POA: Diagnosis not present

## 2021-03-19 MED ORDER — PREDNISONE 20 MG PO TABS: 40.0000 mg | ORAL_TABLET | Freq: Every day | ORAL | 0 refills | Status: DC

## 2021-03-19 MED ORDER — AZITHROMYCIN 250 MG PO TABS: ORAL_TABLET | ORAL | 0 refills | Status: DC

## 2021-03-19 NOTE — Telephone Encounter (Signed)
See requesting z pack / prednisone per patient

## 2021-03-19 NOTE — Telephone Encounter (Signed)
Chief Complaint: wheezing, lost voice, chest soreness from cough, requesting medication  Symptoms: cough, thick white yellowish sputum, sore chest from coughing , lost voice- whispers, shortness of breath not as bad , nasal /runny nose, using more inhalers than normal  Frequency: x 2 weeks ago  Pertinent Negatives: Patient denies chest pain, difficulty breathing, fever. Disposition: [] ED /[x] Urgent Care (no appt availability in office) / [] Appointment(In office/virtual)/ []  Early Virtual Care/ [] Home Care/ [] Refused Recommended Disposition  Additional Notes:  Stress related due to death of father and funeral tomorrow , has not tested for covid. Shortness of breath better but wife reports wheezing noted. Please advise if patient can get VV today . Recommended UC or My Chart VV E visit with [place].  Request a call back if unable to see patient today .     Reason for Disposition  [1] MILD difficulty breathing (e.g., minimal/no SOB at rest, SOB with walking, pulse <100) AND [2] NEW-onset or WORSE than normal  Answer Assessment - Initial Assessment Questions 1. RESPIRATORY STATUS: "Describe your breathing?" (e.g., wheezing, shortness of breath, unable to speak, severe coughing)      Lost voice , c/o constant clearing of throat, wheezing. Reports breathing fine now  2. ONSET: "When did this breathing problem begin?"      2 weeks ago  3. PATTERN "Does the difficult breathing come and go, or has it been constant since it started?"      Wheezing started after air purifier went out , has another and breathing better  4. SEVERITY: "How bad is your breathing?" (e.g., mild, moderate, severe)    - MILD: No SOB at rest, mild SOB with walking, speaks normally in sentences, can lie down, no retractions, pulse < 100.    - MODERATE: SOB at rest, SOB with minimal exertion and prefers to sit, cannot lie down flat, speaks in phrases, mild retractions, audible wheezing, pulse 100-120.    - SEVERE: Very  SOB at rest, speaks in single words, struggling to breathe, sitting hunched forward, retractions, pulse > 120      Mild to moderate  5. RECURRENT SYMPTOM: "Have you had difficulty breathing before?" If Yes, ask: "When was the last time?" and "What happened that time?"      Yes , z pack ordered  6. CARDIAC HISTORY: "Do you have any history of heart disease?" (e.g., heart attack, angina, bypass surgery, angioplasty)      na 7. LUNG HISTORY: "Do you have any history of lung disease?"  (e.g., pulmonary embolus, asthma, emphysema)     Hx asthma  8. CAUSE: "What do you think is causing the breathing problem?"      Air purifier going out  9. OTHER SYMPTOMS: "Do you have any other symptoms? (e.g., dizziness, runny nose, cough, chest pain, fever)     Runny nose, cough lost voice- whisper, using more inhalers than normal. Sore chest from cough, productive cough thick white to yellow mucus  10. O2 SATURATION MONITOR:  "Do you use an oxygen saturation monitor (pulse oximeter) at home?" If Yes, "What is your reading (oxygen level) today?" "What is your usual oxygen saturation reading?" (e.g., 95%)       na 11. PREGNANCY: "Is there any chance you are pregnant?" "When was your last menstrual period?"       na 12. TRAVEL: "Have you traveled out of the country in the last month?" (e.g., travel history, exposures)       na  Protocols used: Breathing Difficulty-A-AH

## 2021-03-19 NOTE — Progress Notes (Signed)
Mr. Christopher Davenport, Christopher Davenport are scheduled for a virtual visit with your provider today.    Just as we do with appointments in the office, we must obtain your consent to participate.  Your consent will be active for this visit and any virtual visit you may have with one of our providers in the next 365 days.    If you have a MyChart account, I can also send a copy of this consent to you electronically.  All virtual visits are billed to your insurance company just like a traditional visit in the office.  As this is a virtual visit, video technology does not allow for your provider to perform a traditional examination.  This may limit your provider's ability to fully assess your condition.  If your provider identifies any concerns that need to be evaluated in person or the need to arrange testing such as labs, EKG, etc, we will make arrangements to do so.    Although advances in technology are sophisticated, we cannot ensure that it will always work on either your end or our end.  If the connection with a video visit is poor, we may have to switch to a telephone visit.  With either a video or telephone visit, we are not always able to ensure that we have a secure connection.   I need to obtain your verbal consent now.   Are you willing to proceed with your visit today?   Christopher Davenport has provided verbal consent on 03/19/2021 for a virtual visit (video or telephone).   Abigail Butts, PA-C 03/19/2021  12:46 PM   Date:  03/19/2021   ID:  Christopher Davenport, DOB [birth year], MRN [id number]  Patient Location: Home Provider Location: Home Office   Participants: Patient and Provider for Visit and Wrap up  Method of visit: Video  Location of Patient: Home Location of Provider: Home Office Consent was obtain for visit over the video. Services rendered by provider: Visit was performed via video  A video enabled telemedicine application was used and I verified that I am speaking with the correct person  using two identifiers.  PCP:  Valerie Roys, DO   Chief Complaint:  URI  History of Present Illness:    Christopher Davenport is a 45 y.o. male with history as stated below. Presents video telehealth for an acute care visit.  Pt reports sinus congestion 2 weeks ago.  Improved some but then worsened again over the last few days.  Pt reports hx of asthma and wheezing.  Cough with light green sputum.  Pt reports associated fatigue.  Pt has been taking mucinex which has loosened the mucus, but has not improved the rest of the symptoms.  Denies headache, fever. Pt uses Symbicort and albuterol rescue inhaler.  He has not needed to use the rescue inhaler.   Pt has not taken a COVID test.  He reports he did have COVID in august.  No other sick contacts.   Pt reports he normally takes a z-pack when he feels like this.   No other aggravating or relieving factors.  No other c/o.  Past Medical, Surgical, Social History, Allergies, and Medications have been Reviewed.  Patient Active Problem List   Diagnosis Date Noted   Epididymal cyst 08/19/2016   Asthma 11/16/2014   Anxiety disorder 11/16/2014   Other headache syndrome 03/27/2014    Social History   Tobacco Use   Smoking status: Former   Smokeless tobacco: Former    Types: Loss adjuster, chartered  Substance Use Topics   Alcohol use: Yes    Alcohol/week: 0.0 standard drinks    Comment: 2 during the week and 3-4 on weekends     Current Outpatient Medications:    azithromycin (ZITHROMAX) 250 MG tablet, Take 500mg  (2 tablets) on day 1 and 250mg  (1 tablet) once daily on days 2 to 5, Disp: 6 tablet, Rfl: 0   predniSONE (DELTASONE) 20 MG tablet, Take 2 tablets (40 mg total) by mouth daily., Disp: 10 tablet, Rfl: 0   albuterol (PROAIR HFA) 108 (90 Base) MCG/ACT inhaler, inhale 2 puffs by mouth every 4 to 6 hours if needed for SHORTNESS OF BREATH, Disp: 18 g, Rfl: 6   b complex vitamins tablet, Take 1 tablet by mouth daily., Disp: , Rfl:    busPIRone (BUSPAR)  10 MG tablet, TAKE 1 TO 1 AND 1/2 TABLETS(10 TO 15 MG) BY MOUTH THREE TIMES DAILY, Disp: 135 tablet, Rfl: 1   FLUoxetine (PROZAC) 20 MG capsule, TAKE 1 CAPSULE(20 MG) BY MOUTH DAILY, Disp: 90 capsule, Rfl: 1   Multiple Vitamin (MULTIVITAMIN WITH MINERALS) TABS tablet, Take 1 tablet by mouth daily., Disp: , Rfl:    SYMBICORT 160-4.5 MCG/ACT inhaler, INHALE 2 PUFFS INTO THE LUNGS TWICE DAILY., Disp: 30.6 g, Rfl: 0   vitamin C (ASCORBIC ACID) 500 MG tablet, Take 500 mg by mouth daily., Disp: , Rfl:    Allergies  Allergen Reactions   Anoro Ellipta [Umeclidinium-Vilanterol] Diarrhea and Other (See Comments)    "achy all over"     Review of Systems  Constitutional:  Negative for chills and fever.  HENT:  Positive for congestion and sore throat. Negative for ear pain.   Eyes:  Negative for blurred vision and double vision.  Respiratory:  Positive for cough and wheezing. Negative for shortness of breath.   Cardiovascular:  Negative for chest pain, palpitations and leg swelling.  Gastrointestinal:  Negative for abdominal pain, diarrhea, nausea and vomiting.  Genitourinary:  Negative for dysuria.  Musculoskeletal:  Negative for myalgias.  Skin:  Negative for rash.  Neurological:  Negative for loss of consciousness, weakness and headaches.  Psychiatric/Behavioral:  The patient is not nervous/anxious.   See HPI for history of present illness.  Physical Exam Constitutional:      General: He is not in acute distress.    Appearance: Normal appearance. He is well-developed. He is not ill-appearing.  HENT:     Head: Normocephalic and atraumatic.     Nose: Congestion present.  Eyes:     General: No scleral icterus.    Conjunctiva/sclera: Conjunctivae normal.  Pulmonary:     Effort: Pulmonary effort is normal.     Comments: Wheezy cough Speaks in full sentences Musculoskeletal:        General: Normal range of motion.     Cervical back: Normal range of motion.  Skin:    Coloration: Skin is  not pale.  Neurological:     General: No focal deficit present.     Mental Status: He is alert.  Psychiatric:        Mood and Affect: Mood normal.              A&P  1. Acute cough  - Suspect bacterial bronchitis given length of time that you have had the symptoms.  -Azithromycin sent to your pharmacy  -Continue using home remedies and albuterol rescue inhaler as needed.  2. Wheezing  -Prednisone burst for wheezing  -Continue using inhalers as needed  -Follow-up with primary  care or urgent care for worsening symptoms    Patient voiced understanding and agreement to plan.   Time:   Today, I have spent 15 minutes with the patient with telehealth technology discussing the above problems, reviewing the chart, previous notes, medications and orders.    Tests Ordered: No orders of the defined types were placed in this encounter.   Medication Changes: Meds ordered this encounter  Medications   azithromycin (ZITHROMAX) 250 MG tablet    Sig: Take 500mg  (2 tablets) on day 1 and 250mg  (1 tablet) once daily on days 2 to 5    Dispense:  6 tablet    Refill:  0   predniSONE (DELTASONE) 20 MG tablet    Sig: Take 2 tablets (40 mg total) by mouth daily.    Dispense:  10 tablet    Refill:  0     Disposition:  Follow up PCP or UC for worsening symptoms  Signed, Abigail Butts, PA-C  03/19/2021 12:56 PM

## 2021-03-19 NOTE — Patient Instructions (Signed)
1. Acute cough  - Suspect bacterial bronchitis given length of time that you have had the symptoms.  -Azithromycin sent to your pharmacy  -Continue using home remedies and albuterol rescue inhaler as needed.   2. Wheezing  -Prednisone burst for wheezing  -Continue using inhalers as needed  -Follow-up with primary care or urgent care for worsening symptoms

## 2021-03-19 NOTE — Telephone Encounter (Signed)
Attempted to switch to high priority to low priority. Change was not successful.

## 2021-03-20 NOTE — Telephone Encounter (Signed)
If he has not gone to the UC yet I can double book him today

## 2021-03-20 NOTE — Telephone Encounter (Signed)
Pt states he did telehealth with [facility] yesterday and they prescribed him the medication he needs. Pt states he does not need appt

## 2021-03-26 ENCOUNTER — Ambulatory Visit (INDEPENDENT_AMBULATORY_CARE_PROVIDER_SITE_OTHER): Payer: BC Managed Care – PPO | Admitting: Family Medicine

## 2021-03-26 ENCOUNTER — Other Ambulatory Visit: Payer: Self-pay

## 2021-04-02 ENCOUNTER — Ambulatory Visit: Payer: BC Managed Care – PPO | Admitting: Family Medicine

## 2021-04-10 ENCOUNTER — Telehealth: Payer: Self-pay | Admitting: Family Medicine

## 2021-04-10 NOTE — Telephone Encounter (Signed)
Spoke to patient, states he has already picked up refill yesterday. Does not need refill from [place].

## 2021-04-10 NOTE — Telephone Encounter (Signed)
Refilled 02/17/2021 #90 with 1 refill.  Requested Prescriptions  Pending Prescriptions Disp Refills   FLUoxetine (PROZAC) 20 MG capsule [Pharmacy Med Name: FLUOXETINE 20MG  CAPSULES] 90 capsule 1    Sig: TAKE 1 CAPSULE(20 MG) BY MOUTH DAILY     Psychiatry:  Antidepressants - SSRI Passed - 04/10/2021  3:14 AM      Passed - Valid encounter within last 6 months    Recent Outpatient Visits          1 month ago Mild intermittent asthma without complication   [place], Megan P, DO   7 months ago Routine general medical examination at a health care facility   [facility], [place] P, DO   1 year ago Mild intermittent asthma without complication   [place], DO   1 year ago Mild intermittent asthma without complication   [place], Megan P, DO   2 years ago Routine general medical examination at a health care facility   [facility], [place], DO

## 2021-09-18 ENCOUNTER — Other Ambulatory Visit: Payer: Self-pay | Admitting: Family Medicine

## 2021-09-18 NOTE — Telephone Encounter (Signed)
Requested Prescriptions  Pending Prescriptions Disp Refills  . SYMBICORT 160-4.5 MCG/ACT inhaler [Pharmacy Med Name: SYMBICORT 160/4.5MCG (120 ORAL INH)] 30.6 g 0    Sig: INHALE 2 PUFFS INTO THE LUNGS TWICE DAILY     Pulmonology:  Combination Products Passed - 09/18/2021  9:05 AM      Passed - Valid encounter within last 12 months    Recent Outpatient Visits          7 months ago Mild intermittent asthma without complication   [place], Megan P, DO   1 year ago Routine general medical examination at a health care facility   [facility], [place], DO   1 year ago Mild intermittent asthma without complication   [place], DO   1 year ago Mild intermittent asthma without complication   [place], Megan P, DO   2 years ago Routine general medical examination at a health care facility   [facility], [place], DO

## 2021-09-21 ENCOUNTER — Other Ambulatory Visit: Payer: Self-pay | Admitting: Family Medicine

## 2021-09-22 NOTE — Telephone Encounter (Signed)
Refilled 09/18/21. Requested Prescriptions  Refused Prescriptions Disp Refills  . budesonide-formoterol (SYMBICORT) 160-4.5 MCG/ACT inhaler [Pharmacy Med Name: BUDESONIDE/FORM 160/4.5MCG(120 INH)] 30.6 g 0    Sig: INHALE 2 PUFFS INTO THE LUNGS TWICE DAILY     Pulmonology:  Combination Products Passed - 09/21/2021 10:23 AM      Passed - Valid encounter within last 12 months    Recent Outpatient Visits          7 months ago Mild intermittent asthma without complication   [place], Megan P, DO   1 year ago Routine general medical examination at a health care facility   [facility], [place] P, DO   1 year ago Mild intermittent asthma without complication   [place], DO   1 year ago Mild intermittent asthma without complication   [place], Megan P, DO   2 years ago Routine general medical examination at a health care facility   [facility], [place], DO

## 2021-10-03 ENCOUNTER — Other Ambulatory Visit: Payer: Self-pay | Admitting: Family Medicine

## 2021-10-03 NOTE — Telephone Encounter (Signed)
Called patient to schedule appt for medication refills. No answer. LVMTCB [phone]

## 2021-10-03 NOTE — Telephone Encounter (Signed)
Courtesy refill. Called patient to schedule appt for medication refills. No answer LVMTCB, Requested Prescriptions  Pending Prescriptions Disp Refills  . FLUoxetine (PROZAC) 20 MG capsule [Pharmacy Med Name: FLUOXETINE '20MG'$  CAPSULES] 30 capsule 0    Sig: TAKE 1 CAPSULE(20 MG) BY MOUTH DAILY     Psychiatry:  Antidepressants - SSRI Failed - 10/03/2021 10:12 AM      Failed - Valid encounter within last 6 months    Recent Outpatient Visits          7 months ago Mild intermittent asthma without complication   [place], Megan P, DO   1 year ago Routine general medical examination at a health care facility   [facility], [place] P, DO   1 year ago Mild intermittent asthma without complication   [place], DO   1 year ago Mild intermittent asthma without complication   [place], Megan P, DO   2 years ago Routine general medical examination at a health care facility   [place], [place], DO

## 2021-10-18 ENCOUNTER — Other Ambulatory Visit: Payer: Self-pay | Admitting: Family Medicine

## 2021-10-20 NOTE — Telephone Encounter (Signed)
Requested Prescriptions  Pending Prescriptions Disp Refills  . busPIRone (BUSPAR) 10 MG tablet [Pharmacy Med Name: BUSPIRONE '10MG'$  TABLETS] 135 tablet 1    Sig: TAKE 1 TO 1 1/2 TABLETS(10 TO 15 MG) BY MOUTH THREE TIMES DAILY     Psychiatry: Anxiolytics/Hypnotics - Non-controlled Passed - 10/18/2021  8:41 AM      Passed - Valid encounter within last 12 months    Recent Outpatient Visits          8 months ago Mild intermittent asthma without complication   [place], Megan P, DO   1 year ago Routine general medical examination at a health care facility   [facility], [place] P, DO   1 year ago Mild intermittent asthma without complication   [place], DO   1 year ago Mild intermittent asthma without complication   [place], Megan P, DO   2 years ago Routine general medical examination at a health care facility   [place], Barb Merino, DO      Future Appointments            In 1 week Wynetta Emery, Barb Merino, DO [facility], [place]

## 2021-10-27 ENCOUNTER — Encounter: Payer: BC Managed Care – PPO | Admitting: Family Medicine

## 2021-10-29 ENCOUNTER — Other Ambulatory Visit: Payer: Self-pay | Admitting: Family Medicine

## 2021-10-29 NOTE — Telephone Encounter (Signed)
Requested Prescriptions  Pending Prescriptions Disp Refills  . SYMBICORT 160-4.5 MCG/ACT inhaler [Pharmacy Med Name: SYMBICORT 160/4.5MCG (120 ORAL INH)] 10.2 g 2    Sig: INHALE 2 PUFFS BY MOUTH TWICE DAILY     Pulmonology:  Combination Products Passed - 10/29/2021  1:46 PM      Passed - Valid encounter within last 12 months    Recent Outpatient Visits          8 months ago Mild intermittent asthma without complication   [place], Megan P, DO   1 year ago Routine general medical examination at a health care facility   [facility], [place] P, DO   1 year ago Mild intermittent asthma without complication   [place], DO   1 year ago Mild intermittent asthma without complication   [place], Megan P, DO   2 years ago Routine general medical examination at a health care facility   [place], Barb Merino, DO      Future Appointments            In 1 week Wynetta Emery, Barb Merino, DO [facility], [place]

## 2021-11-02 ENCOUNTER — Other Ambulatory Visit: Payer: Self-pay | Admitting: Family Medicine

## 2021-11-03 NOTE — Telephone Encounter (Signed)
Courtesy refill. Future appt in 2 days .  Requested Prescriptions  Pending Prescriptions Disp Refills  . FLUoxetine (PROZAC) 20 MG capsule [Pharmacy Med Name: FLUOXETINE '20MG'$  CAPSULES] 30 capsule 0    Sig: TAKE 1 CAPSULE(20 MG) BY MOUTH DAILY     Psychiatry:  Antidepressants - SSRI Failed - 11/02/2021 11:11 AM      Failed - Valid encounter within last 6 months    Recent Outpatient Visits          8 months ago Mild intermittent asthma without complication   [place], Megan P, DO   1 year ago Routine general medical examination at a health care facility   [facility], [place] P, DO   1 year ago Mild intermittent asthma without complication   [place], DO   1 year ago Mild intermittent asthma without complication   [place], Megan P, DO   2 years ago Routine general medical examination at a health care facility   [place], Barb Merino, DO      Future Appointments            In 2 days Wynetta Emery, Barb Merino, DO [facility], [place]

## 2021-11-05 ENCOUNTER — Encounter: Payer: Self-pay | Admitting: Family Medicine

## 2021-11-05 ENCOUNTER — Ambulatory Visit (INDEPENDENT_AMBULATORY_CARE_PROVIDER_SITE_OTHER): Payer: BC Managed Care – PPO | Admitting: Family Medicine

## 2021-11-05 VITALS — BP 113/67 | HR 55 | Temp 98.4°F | Ht 72.2 in | Wt 219.6 lb

## 2021-11-05 DIAGNOSIS — N529 Male erectile dysfunction, unspecified: Secondary | ICD-10-CM | POA: Diagnosis not present

## 2021-11-05 DIAGNOSIS — Z0000 Encounter for general adult medical examination without abnormal findings: Secondary | ICD-10-CM | POA: Diagnosis not present

## 2021-11-05 DIAGNOSIS — J4520 Mild intermittent asthma, uncomplicated: Secondary | ICD-10-CM | POA: Diagnosis not present

## 2021-11-05 DIAGNOSIS — Z1211 Encounter for screening for malignant neoplasm of colon: Secondary | ICD-10-CM | POA: Diagnosis not present

## 2021-11-05 DIAGNOSIS — F411 Generalized anxiety disorder: Secondary | ICD-10-CM

## 2021-11-05 LAB — URINALYSIS, ROUTINE W REFLEX MICROSCOPIC
Bilirubin, UA: NEGATIVE
Glucose, UA: NEGATIVE
Ketones, UA: NEGATIVE
Leukocytes,UA: NEGATIVE
Nitrite, UA: NEGATIVE
Protein,UA: NEGATIVE
RBC, UA: NEGATIVE
Specific Gravity, UA: 1.01 (ref 1.005–1.030)
Urobilinogen, Ur: 0.2 mg/dL (ref 0.2–1.0)
pH, UA: 6.5 (ref 5.0–7.5)

## 2021-11-05 MED ORDER — FLUOXETINE HCL 20 MG PO CAPS: 20.0000 mg | ORAL_CAPSULE | Freq: Every day | ORAL | 1 refills | Status: DC

## 2021-11-05 MED ORDER — ALBUTEROL SULFATE HFA 108 (90 BASE) MCG/ACT IN AERS
INHALATION_SPRAY | RESPIRATORY_TRACT | 6 refills | Status: DC
Start: 2021-11-05 — End: 2022-05-15

## 2021-11-05 MED ORDER — BUSPIRONE HCL 10 MG PO TABS
ORAL_TABLET | ORAL | 1 refills | Status: DC
Start: 2021-11-05 — End: 2022-05-15

## 2021-11-05 MED ORDER — BUDESONIDE-FORMOTEROL FUMARATE 160-4.5 MCG/ACT IN AERO
2.0000 | INHALATION_SPRAY | Freq: Two times a day (BID) | RESPIRATORY_TRACT | 2 refills | Status: DC
Start: 2021-11-05 — End: 2022-04-10

## 2021-11-05 NOTE — Assessment & Plan Note (Signed)
Under good control on current regimen. Continue current regimen. Continue to monitor. Call with any concerns. Refills given.   

## 2021-11-05 NOTE — Progress Notes (Signed)
BP 113/67   Pulse (!) 55   Temp 98.4 F (36.9 C) (Oral)   Ht 6' 0.2" (1.834 m)   Wt 219 lb 9.6 oz (99.6 kg)   SpO2 98%   BMI 29.62 kg/m    Subjective:    Patient ID: Christopher Davenport, male    DOB: 13-Sep-1975, 46 y.o.   MRN: [id number]  HPI: Christopher Davenport is a 46 y.o. male presenting on 11/05/2021 for comprehensive medical examination. Current medical complaints include:  ANXIETY/STRESS Duration: chronic Status:stable Anxious mood: yes  Excessive worrying: yes Irritability: no  Sweating: no Nausea: no Palpitations:no Hyperventilation: no Panic attacks: no Agoraphobia: no  Obscessions/compulsions: no Depressed mood: yes    11/05/2021    9:50 AM 02/17/2021    8:19 AM 08/15/2020    8:12 AM 01/22/2020   11:02 AM 02/24/2019    3:53 PM  Depression screen PHQ 2/9  Decreased Interest 1 1 0 0 0  Down, Depressed, Hopeless 1 1 0 0 0  PHQ - 2 Score 2 2 0 0 0  Altered sleeping '2 2 3 3 2  '$ Tired, decreased energy '2 1 3 3 1  '$ Change in appetite 1 1 0 0 1  Feeling bad or failure about yourself  0 0 0 0 0  Trouble concentrating '2 1 1 1 1  '$ Moving slowly or fidgety/restless 2 0 0 1 0  Suicidal thoughts 0 0 0 0 0  PHQ-9 Score '11 7 7 8 5  '$ Difficult doing work/chores Somewhat difficult  Not difficult at all Not difficult at all Not difficult at all   Anhedonia: no Weight changes: no Insomnia: no   Hypersomnia: no Fatigue/loss of energy: no Feelings of worthlessness: no Feelings of guilt: no Impaired concentration/indecisiveness: no Suicidal ideations: no  Crying spells: no Recent Stressors/Life Changes: no   Relationship problems: no   Family stress: no     Financial stress: no    Job stress: yes    Recent death/loss: no  ASTHMA Asthma status: stable Satisfied with current treatment?: yes Albuterol/rescue inhaler frequency: occasionally Dyspnea frequency: occasionally Wheezing frequency: occasionally Cough frequency: occasionally Nocturnal symptom frequency: never   Limitation of activity: no Current upper respiratory symptoms: no Triggers: humidity Aerochamber/spacer use: no Visits to ER or Urgent Care in past year: no Pneumovax: Up to Date Influenza: Up to Date  Interim Problems from his last visit: no  Depression Screen done today and results listed below:     11/05/2021    9:50 AM 02/17/2021    8:19 AM 08/15/2020    8:12 AM 01/22/2020   11:02 AM 02/24/2019    3:53 PM  Depression screen PHQ 2/9  Decreased Interest 1 1 0 0 0  Down, Depressed, Hopeless 1 1 0 0 0  PHQ - 2 Score 2 2 0 0 0  Altered sleeping '2 2 3 3 2  '$ Tired, decreased energy '2 1 3 3 1  '$ Change in appetite 1 1 0 0 1  Feeling bad or failure about yourself  0 0 0 0 0  Trouble concentrating '2 1 1 1 1  '$ Moving slowly or fidgety/restless 2 0 0 1 0  Suicidal thoughts 0 0 0 0 0  PHQ-9 Score '11 7 7 8 5  '$ Difficult doing work/chores Somewhat difficult  Not difficult at all Not difficult at all Not difficult at all   Past Medical History:  Past Medical History:  Diagnosis Date   Anxiety    Asthma  GERD (gastroesophageal reflux disease)     Surgical History:  History reviewed. No pertinent surgical history.  Medications:  Current Outpatient Medications on File Prior to Visit  Medication Sig   b complex vitamins tablet Take 1 tablet by mouth daily.   Multiple Vitamin (MULTIVITAMIN WITH MINERALS) TABS tablet Take 1 tablet by mouth daily.   vitamin C (ASCORBIC ACID) 500 MG tablet Take 500 mg by mouth daily.   No current facility-administered medications on file prior to visit.    Allergies:  Allergies  Allergen Reactions   Anoro Ellipta [Umeclidinium-Vilanterol] Diarrhea and Other (See Comments)    "achy all over"    Social History:  Social History   Socioeconomic History   Marital status: Married    Spouse name: Not on file   Number of children: Not on file   Years of education: Not on file   Highest education level: Not on file  Occupational History   Not on  file  Tobacco Use   Smoking status: Former   Smokeless tobacco: Former    Types: Nurse, children's Use: Never used  Substance and Sexual Activity   Alcohol use: Yes    Comment: on the weekends   Drug use: No   Sexual activity: Yes  Other Topics Concern   Not on file  Social History Narrative   Not on file   Social Determinants of Health   Financial Resource Strain: Not on file  Food Insecurity: Not on file  Transportation Needs: Not on file  Physical Activity: Not on file  Stress: Not on file  Social Connections: Not on file  Intimate Partner Violence: Not on file   Social History   Tobacco Use  Smoking Status Former  Smokeless Tobacco Former   Types: Loss adjuster, chartered   Social History   Substance and Sexual Activity  Alcohol Use Yes   Comment: on the weekends    Family History:  Family History  Problem Relation Age of Onset   Diabetes Mother    Kidney disease Mother    Heart disease Mother    Hypertension Mother    Hyperlipidemia Mother    Cancer Father        lung   Lung cancer Father    Dementia Maternal Grandmother    Diabetes Maternal Grandfather    Diabetes Paternal Grandmother     Past medical history, surgical history, medications, allergies, family history and social history reviewed with patient today and changes made to appropriate areas of the chart.   Review of Systems  Constitutional: Negative.   HENT:  Positive for sore throat. Negative for congestion, ear discharge, ear pain, hearing loss, nosebleeds, sinus pain and tinnitus.   Eyes:  Positive for blurred vision. Negative for double vision, photophobia, pain, discharge and redness.  Respiratory:  Positive for cough. Negative for hemoptysis, sputum production, shortness of breath, wheezing and stridor.   Cardiovascular: Negative.   Gastrointestinal: Negative.   Genitourinary: Negative.   Musculoskeletal:  Positive for joint pain. Negative for back pain, falls, myalgias and neck pain.   Skin: Negative.   Neurological: Negative.   Endo/Heme/Allergies: Negative.   Psychiatric/Behavioral:  Negative for depression, hallucinations, memory loss, substance abuse and suicidal ideas. The patient is nervous/anxious. The patient does not have insomnia.    All other ROS negative except what is listed above and in the HPI.      Objective:    BP 113/67   Pulse (!) 55   Temp 98.4  F (36.9 C) (Oral)   Ht 6' 0.2" (1.834 m)   Wt 219 lb 9.6 oz (99.6 kg)   SpO2 98%   BMI 29.62 kg/m   Wt Readings from Last 3 Encounters:  11/05/21 219 lb 9.6 oz (99.6 kg)  02/17/21 223 lb 3.2 oz (101.2 kg)  08/15/20 226 lb (102.5 kg)    Physical Exam Vitals and nursing note reviewed.  Constitutional:      General: He is not in acute distress.    Appearance: Normal appearance. He is not ill-appearing, toxic-appearing or diaphoretic.  HENT:     Head: Normocephalic and atraumatic.     Right Ear: Tympanic membrane, ear canal and external ear normal. There is no impacted cerumen.     Left Ear: Tympanic membrane, ear canal and external ear normal. There is no impacted cerumen.     Nose: Nose normal. No congestion or rhinorrhea.     Mouth/Throat:     Mouth: Mucous membranes are moist.     Pharynx: Oropharynx is clear. No oropharyngeal exudate or posterior oropharyngeal erythema.  Eyes:     General: No scleral icterus.       Right eye: No discharge.        Left eye: No discharge.     Extraocular Movements: Extraocular movements intact.     Conjunctiva/sclera: Conjunctivae normal.     Pupils: Pupils are equal, round, and reactive to light.  Neck:     Vascular: No carotid bruit.  Cardiovascular:     Rate and Rhythm: Normal rate and regular rhythm.     Pulses: Normal pulses.     Heart sounds: No murmur heard.    No friction rub. No gallop.  Pulmonary:     Effort: Pulmonary effort is normal. No respiratory distress.     Breath sounds: Normal breath sounds. No stridor. No wheezing, rhonchi or  rales.  Chest:     Chest wall: No tenderness.  Abdominal:     General: Abdomen is flat. Bowel sounds are normal. There is no distension.     Palpations: Abdomen is soft. There is no mass.     Tenderness: There is no abdominal tenderness. There is no right CVA tenderness, left CVA tenderness, guarding or rebound.     Hernia: No hernia is present.  Genitourinary:    Comments: Genital exam deferred with shared decision making Musculoskeletal:        General: No swelling, tenderness, deformity or signs of injury.     Cervical back: Normal range of motion and neck supple. No rigidity. No muscular tenderness.     Right lower leg: No edema.     Left lower leg: No edema.  Lymphadenopathy:     Cervical: No cervical adenopathy.  Skin:    General: Skin is warm and dry.     Capillary Refill: Capillary refill takes less than 2 seconds.     Coloration: Skin is not jaundiced or pale.     Findings: No bruising, erythema, lesion or rash.  Neurological:     General: No focal deficit present.     Mental Status: He is alert and oriented to person, place, and time.     Cranial Nerves: No cranial nerve deficit.     Sensory: No sensory deficit.     Motor: No weakness.     Coordination: Coordination normal.     Gait: Gait normal.     Deep Tendon Reflexes: Reflexes normal.  Psychiatric:  Mood and Affect: Mood normal.        Behavior: Behavior normal.        Thought Content: Thought content normal.        Judgment: Judgment normal.     Results for orders placed or performed in visit on 11/05/21  Urinalysis, Routine w reflex microscopic  Result Value Ref Range   Specific Gravity, UA 1.010 1.005 - 1.030   pH, UA 6.5 5.0 - 7.5   Color, UA Yellow Yellow   Appearance Ur Clear Clear   Leukocytes,UA Negative Negative   Protein,UA Negative Negative/Trace   Glucose, UA Negative Negative   Ketones, UA Negative Negative   RBC, UA Negative Negative   Bilirubin, UA Negative Negative    Urobilinogen, Ur 0.2 0.2 - 1.0 mg/dL   Nitrite, UA Negative Negative      Assessment & Plan:   Problem List Items Addressed This Visit       Respiratory   Asthma    Under good control on current regimen. Continue current regimen. Continue to monitor. Call with any concerns. Refills given.        Relevant Medications   albuterol (PROAIR HFA) 108 (90 Base) MCG/ACT inhaler   budesonide-formoterol (SYMBICORT) 160-4.5 MCG/ACT inhaler     Other   Anxiety disorder    Under good control on current regimen. Continue current regimen. Continue to monitor. Call with any concerns. Refills given.        Relevant Medications   busPIRone (BUSPAR) 10 MG tablet   FLUoxetine (PROZAC) 20 MG capsule   Other Visit Diagnoses     Routine general medical examination at a health care facility    -  Primary   Vaccines up to date. Screening labs checked today. Colonoscopy ordered. Continue diet and exercise. Call with any concerns.    Relevant Orders   Comprehensive metabolic panel   CBC with Differential/Platelet   Lipid Panel w/o Chol/HDL Ratio   PSA   TSH   Urinalysis, Routine w reflex microscopic (Completed)   Erectile dysfunction, unspecified erectile dysfunction type       Declines medication at this time. Will check testosterone. Call with any concerns.    Relevant Orders   Testosterone, free, total([facility])   Screening for colon cancer       Referral to GI made today.   Relevant Orders   Ambulatory referral to Gastroenterology        LABORATORY TESTING:  Health maintenance labs ordered today as discussed above.   The natural history of prostate cancer and ongoing controversy regarding screening and potential treatment outcomes of prostate cancer has been discussed with the patient. The meaning of a false positive PSA and a false negative PSA has been discussed. He indicates understanding of the limitations of this screening test and wishes to proceed with screening PSA  testing.   IMMUNIZATIONS:   - Tdap: Tetanus vaccination status reviewed: last tetanus booster within 10 years. - Influenza: Postponed to flu season - Pneumovax: Up to date - Prevnar: Not applicable - COVID: Up to date - HPV: Not applicable - Shingrix vaccine: Not applicable  SCREENING: - Colonoscopy: Ordered today  Discussed with patient purpose of the colonoscopy is to detect colon cancer at curable precancerous or early stages   PATIENT COUNSELING:    Sexuality: Discussed sexually transmitted diseases, partner selection, use of condoms, avoidance of unintended pregnancy  and contraceptive alternatives.   Advised to avoid cigarette smoking.  I discussed with the patient that most people  either abstain from alcohol or drink within safe limits (<=14/week and <=4 drinks/occasion for males, <=7/weeks and <= 3 drinks/occasion for females) and that the risk for alcohol disorders and other health effects rises proportionally with the number of drinks per week and how often a drinker exceeds daily limits.  Discussed cessation/primary prevention of drug use and availability of treatment for abuse.   Diet: Encouraged to adjust caloric intake to maintain  or achieve ideal body weight, to reduce intake of dietary saturated fat and total fat, to limit sodium intake by avoiding high sodium foods and not adding table salt, and to maintain adequate dietary potassium and calcium preferably from fresh fruits, vegetables, and low-fat dairy products.    stressed the importance of regular exercise  Injury prevention: Discussed safety belts, safety helmets, smoke detector, smoking near bedding or upholstery.   Dental health: Discussed importance of regular tooth brushing, flossing, and dental visits.   Follow up plan: NEXT PREVENTATIVE PHYSICAL DUE IN 1 YEAR. Return in about 6 months (around 05/08/2022).

## 2021-11-07 ENCOUNTER — Encounter: Payer: Self-pay | Admitting: Family Medicine

## 2021-11-10 ENCOUNTER — Telehealth: Payer: Self-pay

## 2021-11-10 NOTE — Telephone Encounter (Signed)
PA for budesonide-formoterol (Symbicort), submitted and approved. Key: [id number]

## 2021-11-11 LAB — LIPID PANEL W/O CHOL/HDL RATIO
Cholesterol, Total: 190 mg/dL (ref 100–199)
HDL: 53 mg/dL (ref >39)
LDL Chol Calc (NIH): 123 mg/dL — ABNORMAL HIGH (ref 0–99)
Triglycerides: 74 mg/dL (ref 0–149)
VLDL Cholesterol Cal: 14 mg/dL (ref 5–40)

## 2021-11-11 LAB — COMPREHENSIVE METABOLIC PANEL
ALT: 16 IU/L (ref 0–44)
AST: 17 IU/L (ref 0–40)
Albumin/Globulin Ratio: 1.8 (ref 1.2–2.2)
Albumin: 4 g/dL — ABNORMAL LOW (ref 4.1–5.1)
Alkaline Phosphatase: 60 IU/L (ref 44–121)
BUN/Creatinine Ratio: 16 (ref 9–20)
BUN: 18 mg/dL (ref 6–24)
Bilirubin Total: 0.5 mg/dL (ref 0.0–1.2)
CO2: 22 mmol/L (ref 20–29)
Calcium: 8.9 mg/dL (ref 8.7–10.2)
Chloride: 104 mmol/L (ref 96–106)
Creatinine, Ser: 1.15 mg/dL (ref 0.76–1.27)
Globulin, Total: 2.2 g/dL (ref 1.5–4.5)
Glucose: 94 mg/dL (ref 70–99)
Potassium: 4 mmol/L (ref 3.5–5.2)
Sodium: 142 mmol/L (ref 134–144)
Total Protein: 6.2 g/dL (ref 6.0–8.5)
eGFR: 79 mL/min/{1.73_m2} (ref >59)

## 2021-11-11 LAB — CBC WITH DIFFERENTIAL/PLATELET
Basophils Absolute: 0 10*3/uL (ref 0.0–0.2)
Basos: 1 %
EOS (ABSOLUTE): 0.3 10*3/uL (ref 0.0–0.4)
Eos: 7 %
Hematocrit: 39.1 % (ref 37.5–51.0)
Hemoglobin: 13.2 g/dL (ref 13.0–17.7)
Immature Grans (Abs): 0 10*3/uL (ref 0.0–0.1)
Immature Granulocytes: 0 %
Lymphocytes Absolute: 1.5 10*3/uL (ref 0.7–3.1)
Lymphs: 34 %
MCH: 33.2 pg — ABNORMAL HIGH (ref 26.6–33.0)
MCHC: 33.8 g/dL (ref 31.5–35.7)
MCV: 98 fL — ABNORMAL HIGH (ref 79–97)
Monocytes Absolute: 0.3 10*3/uL (ref 0.1–0.9)
Monocytes: 7 %
Neutrophils Absolute: 2.2 10*3/uL (ref 1.4–7.0)
Neutrophils: 51 %
Platelets: 184 10*3/uL (ref 150–450)
RBC: 3.98 x10E6/uL — ABNORMAL LOW (ref 4.14–5.80)
RDW: 13.3 % (ref 11.6–15.4)
WBC: 4.3 10*3/uL (ref 3.4–10.8)

## 2021-11-11 LAB — TESTOSTERONE, FREE, TOTAL, SHBG
Sex Hormone Binding: 45.6 nmol/L (ref 16.5–55.9)
Testosterone, Free: 6.2 pg/mL — ABNORMAL LOW (ref 6.8–21.5)
Testosterone: 411 ng/dL (ref 264–916)

## 2021-11-11 LAB — TSH: TSH: 2.79 u[IU]/mL (ref 0.450–4.500)

## 2021-11-11 LAB — PSA: Prostate Specific Ag, Serum: 0.5 ng/mL (ref 0.0–4.0)

## 2021-12-22 ENCOUNTER — Telehealth (INDEPENDENT_AMBULATORY_CARE_PROVIDER_SITE_OTHER): Payer: BC Managed Care – PPO | Admitting: Physician Assistant

## 2021-12-22 ENCOUNTER — Ambulatory Visit: Payer: Self-pay

## 2021-12-22 ENCOUNTER — Encounter: Payer: Self-pay | Admitting: Physician Assistant

## 2021-12-22 DIAGNOSIS — J0100 Acute maxillary sinusitis, unspecified: Secondary | ICD-10-CM

## 2021-12-22 MED ORDER — AZITHROMYCIN 250 MG PO TABS: ORAL_TABLET | ORAL | 0 refills | Status: AC

## 2021-12-22 NOTE — Telephone Encounter (Signed)
  Chief Complaint: sinus congestion Symptoms: sinus pressure, nasal congestion, earache, runny nose Frequency: 1 week Pertinent Negatives: Patient denies SOB or cough Disposition: '[]'$ ED /'[]'$ Urgent Care (no appt availability in office) / '[x]'$ Appointment(In office/virtual)/ '[]'$  [facility]/ '[]'$ Home Care/ '[]'$ Refused Recommended Disposition /'[]'$ [facility]/ '[]'$  Follow-up with PCP Additional Notes: pt has been taking Zyrtec for symptoms but not seen any improvement. Advised if he has some Mucinex to start taking that and scheduled VV for today at 80 with [place], [place].   Summary: sinus infection / zpack   Pt has a sinus infection and asked if Dr. Wynetta Emery can send something (Zpack) in for it / please advise      Reason for Disposition  Earache  Answer Assessment - Initial Assessment Questions 1. LOCATION: "Where does it hurt?"      In head and nasal area 2. ONSET: "When did the sinus pain start?"  (e.g., hours, days)      Last Monday 3. SEVERITY: "How bad is the pain?"   (Scale 1-10; mild, moderate or severe)   - MILD (1-3): doesn't interfere with normal activities    - MODERATE (4-7): interferes with normal activities (e.g., work or school) or awakens from sleep   - SEVERE (8-10): excruciating pain and patient unable to do any normal activities        moderate 5. NASAL CONGESTION: "Is the nose blocked?" If Yes, ask: "Can you open it or must you breathe through your mouth?"     yes 7. FEVER: "Do you have a fever?" If Yes, ask: "What is it, how was it measured, and when did it start?"      no 8. OTHER SYMPTOMS: "Do you have any other symptoms?" (e.g., sore throat, cough, earache, difficulty breathing)     Runny nose, nasal congestion, earache  Protocols used: Sinus Pain or Congestion-A-AH

## 2021-12-22 NOTE — Progress Notes (Signed)
Virtual Visit via Video Note  I connected with Christopher Davenport on 12/22/21 at  2:20 PM EDT by a video enabled telemedicine application and verified that I am speaking with the correct person using two identifiers. Today's Provider: Talitha Givens, MHS, PA-C Introduced myself to the patient as a PA-C and provided education on APPs in clinical practice.   Location: Patient: at home, [place], [place]  Provider: [place], [place]    I discussed the limitations of evaluation and management by telemedicine and the availability of in person appointments. The patient expressed understanding and agreed to proceed.  Chief Complaint  Patient presents with   Sinus Congestion    Nasal congestion, runny nose, started last Monday     History of Present Illness:   Reports he is having sinus congestion, post nasal drainage, rhinorrhea  Onset: gradual -thought it was allergies at first but progressed and is lingering now Does not seem to be improving Duration: one week  Alleviating: Mucinex helped a bit  Aggravating: nothing  Interventions: Mucinex COVID: he has not tested for COVID at home Sick contacts: none     Review of Systems  Constitutional:  Negative for chills and fever.  HENT:  Positive for congestion and sinus pain. Negative for ear pain and sore throat.        Rhinorrhea Postnasal drainage  Eyes:  Negative for blurred vision, double vision and discharge.  Respiratory:  Positive for cough. Negative for shortness of breath and wheezing.   Gastrointestinal:  Negative for nausea and vomiting.  Musculoskeletal:  Negative for myalgias.  Neurological:  Negative for dizziness and headaches.      Observations/Objective:  Due to the nature of the virtual visit, physical exam and observations are limited. Able to obtain the following observations:   Alert, oriented, Appears comfortable, in no acute distress.  No scleral injection, no appreciated hoarseness,  tachypnea, wheeze or strider. Able to maintain conversation without visible strain.  No cough appreciated during visit.    Assessment and Plan:  Problem List Items Addressed This Visit   None Visit Diagnoses     Acute maxillary sinusitis, recurrence not specified    -  Primary Acute, new concern Visit with patient indicates symptoms comprised of congestion, rhinorrhea, post nasal drainage for one week  congruent with acute URI that I suspect is bacterial in nature  Patient is outside therapeutic window for antivirals- no flu or COVID testing performed today.  Due to nature and duration of symptoms recommended treatment regimen is symptomatic relief and follow up if needed Will provide Zpack for bacterial sinusitis per patient request.  Discussed with patient the various viral and bacterial etiologies of current illness and appropriate course of treatment Discussed OTC medication options for multisymptom relief such as Dayquil/Nyquil, Theraflu, AlkaSeltzer, etc. Discussed return precautions if symptoms are not improving or worsen over next 5-7 days.    Relevant Medications   azithromycin (ZITHROMAX) 250 MG tablet      Follow Up Instructions:    I discussed the assessment and treatment plan with the patient. The patient was provided an opportunity to ask questions and all were answered. The patient agreed with the plan and demonstrated an understanding of the instructions.   The patient was advised to call back or seek an in-person evaluation if the symptoms worsen or if the condition fails to improve as anticipated.  I provided 6 minutes of non-face-to-face time during this encounter.  No follow-ups on file.  I,  E , PA-C, have reviewed all documentation for this visit. The documentation on 12/22/21 for the exam, diagnosis, procedures, and orders are all accurate and complete.   Talitha Givens, MHS, PA-C [facility]

## 2022-02-16 ENCOUNTER — Ambulatory Visit: Payer: Self-pay | Admitting: *Deleted

## 2022-02-16 NOTE — Telephone Encounter (Signed)
  Chief Complaint: coughing and chest congestion along with nasal congestion,  Voice is hoarse.   Video visit done on 12/22/2021 with Erin Mecum, PA-C.   The symptoms are not going away.   OTC medications not helping much. Symptoms: above Frequency: Over a week Pertinent Negatives: Patient denies fever Disposition: '[]'$ ED /'[]'$ Urgent Care (no appt availability in office) / '[x]'$ Appointment(In office/virtual)/ '[]'$  [facility]/ '[]'$ Home Care/ '[]'$ Refused Recommended Disposition /'[]'$ [facility]/ '[]'$  Follow-up with PCP Additional Notes: Video visit appt. Made for 02/17/2022 at 1:40 with Erin Mecum, PA-C.

## 2022-02-16 NOTE — Telephone Encounter (Signed)
Message from [place] sent at 02/16/2022  1:43 PM EST  Summary: bad cough and congestion   Patient called in looking for suggestions for bad cough he has with nasal congestion, and losing voice. says otc's not helping and didn't want to mak ean appt.          Call History   Type Contact Phone/Fax User  02/16/2022 01:42 PM EST Phone (Incoming) Esgar, Barnick F (Self) [phone] Lemmie Evens) [place], [place]   Reason for Disposition  [1] Continuous (nonstop) coughing interferes with work or school AND [2] no improvement using cough treatment per Care Advice  Answer Assessment - Initial Assessment Questions 1. ONSET: "When did the cough begin?"      I mostly have the cough for the last week or so.  I did a video visit (12/22/2021 with Erin) and was given an antibiotic.   I'm using Robitussin and Mucinex DM too.     I would like to try the Z Pak.   It has helped me in the past.   I would prefer a video visit instead of coming in if possible. 2. SEVERITY: "How bad is the cough today?"      bad 3. SPUTUM: "Describe the color of your sputum" (none, dry cough; clear, white, yellow, green)     Now clear after the antibiotic given to me during the video visit.   It was green prior to that. 4. HEMOPTYSIS: "Are you coughing up any blood?" If so ask: "How much?" (flecks, streaks, tablespoons, etc.)     Not asked 5. DIFFICULTY BREATHING: "Are you having difficulty breathing?" If Yes, ask: "How bad is it?" (e.g., mild, moderate, severe)    - MILD: No SOB at rest, mild SOB with walking, speaks normally in sentences, can lie down, no retractions, pulse < 100.    - MODERATE: SOB at rest, SOB with minimal exertion and prefers to sit, cannot lie down flat, speaks in phrases, mild retractions, audible wheezing, pulse 100-120.    - SEVERE: Very SOB at rest, speaks in single words, struggling to breathe, sitting hunched forward, retractions, pulse > 120      I have asthma.   My shortness of breath is  different than my usual. 6. FEVER: "Do you have a fever?" If Yes, ask: "What is your temperature, how was it measured, and when did it start?"     No 7. CARDIAC HISTORY: "Do you have any history of heart disease?" (e.g., heart attack, congestive heart failure)      Not asked 8. LUNG HISTORY: "Do you have any history of lung disease?"  (e.g., pulmonary embolus, asthma, emphysema)     Not asked 9. PE RISK FACTORS: "Do you have a history of blood clots?" (or: recent major surgery, recent prolonged travel, bedridden)     Not asked 10. OTHER SYMPTOMS: "Do you have any other symptoms?" (e.g., runny nose, wheezing, chest pain)       Chest congestion and coughing 11. PREGNANCY: "Is there any chance you are pregnant?" "When was your last menstrual period?"       N/A  12. TRAVEL: "Have you traveled out of the country in the last month?" (e.g., travel history, exposures)       N/A  Protocols used: Cough - Acute Productive-A-AH

## 2022-02-17 ENCOUNTER — Telehealth (INDEPENDENT_AMBULATORY_CARE_PROVIDER_SITE_OTHER): Payer: BC Managed Care – PPO | Admitting: Physician Assistant

## 2022-02-17 ENCOUNTER — Encounter: Payer: Self-pay | Admitting: Physician Assistant

## 2022-02-17 DIAGNOSIS — J209 Acute bronchitis, unspecified: Secondary | ICD-10-CM | POA: Diagnosis not present

## 2022-02-17 DIAGNOSIS — J45909 Unspecified asthma, uncomplicated: Secondary | ICD-10-CM | POA: Diagnosis not present

## 2022-02-17 MED ORDER — AZITHROMYCIN 250 MG PO TABS: ORAL_TABLET | ORAL | 0 refills | Status: DC

## 2022-02-17 NOTE — Progress Notes (Signed)
Virtual Visit via Video Note  I connected with Christopher Davenport on 02/17/22 at  1:40 PM EST by a video enabled telemedicine application and verified that I am speaking with the correct person using two identifiers.  Today's Provider: Talitha Givens, MHS, PA-C Introduced myself to the patient as a PA-C and provided education on APPs in clinical practice.    Location: Patient: At home, Christopher Davenport, [place]  Provider: [place], [place]    I discussed the limitations of evaluation and management by telemedicine and the availability of in person appointments. The patient expressed understanding and agreed to proceed.   Chief Complaint  Patient presents with   Cough    Patient states he is coughing and has chest congestion for about a week. Has tried robitussin and mucinex.     History of Present Illness:   Reports symptoms started last Sunday  Reports persistent cough and sensation of chest congestion  States it started with sore throat and progressed to include coughing  Cough is predominantly dry but can become productive at times He denies recent sick contacts  Interventions: started with Mucinex then switched to Robitussin DM - reports moderate relief but wears off quickly  Has been using Symbicort 2x per day States he has been using his Albuterol inhaler about once per day  He reports he has asthma and states he has not been able to exercise for the past week as he suspects this would cause more coughing and wheezing      Review of Systems  Constitutional:  Positive for malaise/fatigue. Negative for chills and fever.  HENT:  Positive for sore throat. Negative for congestion and sinus pain.   Eyes:  Negative for blurred vision and double vision.  Respiratory:  Positive for cough. Negative for sputum production, shortness of breath and wheezing.   Gastrointestinal:  Negative for diarrhea, nausea and vomiting.  Musculoskeletal:  Negative for myalgias.   Neurological:  Negative for dizziness and headaches.      Observations/Objective:   Due to the nature of the virtual visit, physical exam and observations are limited. Able to obtain the following observations:   Alert, oriented x 3  Appears comfortable, in no acute distress.  No scleral injection,  tachypnea, wheeze or strider. Able to maintain conversation without visible strain.  No cough appreciated during visit.  Patient sounds hoarse during conversation    Assessment and Plan:   Problem List Items Addressed This Visit   None Visit Diagnoses     Bronchitis with asthma, acute    -  Primary Acute, new concern Patient reports persistent dry cough and chest congestion for the last week that is not resolving with Mucinex and Robitussin use  Pt has a hx of asthma and has been using Symbicort BID as directed and Albuterol about once per day Reviewed bronchospasms and how viral illness can exacerbate asthma symptoms Patient is requesting Z pack to assist with congestion and bronchitis Will provide script for this and recommend he continue with Mucinex and Robitussin to aid with symptom management Follow up as needed for persistent or progressing symptoms in office to allow for more thorough PE    Relevant Medications   azithromycin (ZITHROMAX) 250 MG tablet       Follow Up Instructions:    I discussed the assessment and treatment plan with the patient. The patient was provided an opportunity to ask questions and all were answered. The patient agreed with the plan and  demonstrated an understanding of the instructions.   The patient was advised to call back or seek an in-person evaluation if the symptoms worsen or if the condition fails to improve as anticipated.  I provided 9 minutes of non-face-to-face time during this encounter.  No follow-ups on file.   I,  E , PA-C, have reviewed all documentation for this visit. The documentation on 02/17/22 for the  exam, diagnosis, procedures, and orders are all accurate and complete.   Talitha Givens, MHS, PA-C [facility]

## 2022-02-25 ENCOUNTER — Other Ambulatory Visit: Payer: Self-pay | Admitting: *Deleted

## 2022-02-25 ENCOUNTER — Telehealth: Payer: Self-pay | Admitting: *Deleted

## 2022-02-25 DIAGNOSIS — Z1211 Encounter for screening for malignant neoplasm of colon: Secondary | ICD-10-CM

## 2022-02-25 MED ORDER — NA SULFATE-K SULFATE-MG SULF 17.5-3.13-1.6 GM/177ML PO SOLN: 1.0000 | Freq: Once | ORAL | 0 refills | Status: AC

## 2022-02-25 NOTE — Telephone Encounter (Signed)
Gastroenterology Pre-Procedure Review  Request Date: 03/19/2022 Requesting Physician: Dr. Vicente Males  PATIENT REVIEW QUESTIONS: The patient responded to the following health history questions as indicated:    1. Are you having any GI issues? no 2. Do you have a personal history of Polyps? no 3. Do you have a family history of Colon Cancer or Polyps? yes (grandmother on mother's side) 4. Diabetes Mellitus? no 5. Joint replacements in the past 12 months?no 6. Major health problems in the past 3 months?no 7. Any artificial heart valves, MVP, or defibrillator?no    MEDICATIONS & ALLERGIES:    Patient reports the following regarding taking any anticoagulation/antiplatelet therapy:   Plavix, Coumadin, Eliquis, Xarelto, Lovenox, Pradaxa, Brilinta, or Effient? no Aspirin? yes (81 mg)  Patient confirms/reports the following medications:  Current Outpatient Medications  Medication Sig Dispense Refill   albuterol (PROAIR HFA) 108 (90 Base) MCG/ACT inhaler inhale 2 puffs by mouth every 4 to 6 hours if needed for SHORTNESS OF BREATH 18 g 6   azithromycin (ZITHROMAX) 250 MG tablet Take '500mg'$  PO daily x1d and then '250mg'$  daily x4 days 6 each 0   b complex vitamins tablet Take 1 tablet by mouth daily.     budesonide-formoterol (SYMBICORT) 160-4.5 MCG/ACT inhaler Inhale 2 puffs into the lungs 2 (two) times daily. 10.2 g 2   busPIRone (BUSPAR) 10 MG tablet TAKE 1 TO 1 1/2 TABLETS(10 TO 15 MG) BY MOUTH THREE TIMES DAILY 135 tablet 1   FLUoxetine (PROZAC) 20 MG capsule Take 1 capsule (20 mg total) by mouth daily. 90 capsule 1   Multiple Vitamin (MULTIVITAMIN WITH MINERALS) TABS tablet Take 1 tablet by mouth daily.     vitamin C (ASCORBIC ACID) 500 MG tablet Take 500 mg by mouth daily.     No current facility-administered medications for this visit.    Patient confirms/reports the following allergies:  Allergies  Allergen Reactions   Anoro Ellipta [Umeclidinium-Vilanterol] Diarrhea and Other (See Comments)     "achy all over"    No orders of the defined types were placed in this encounter.   AUTHORIZATION INFORMATION Primary Insurance: 1D#: Group #:  Secondary Insurance: 1D#: Group #:  SCHEDULE INFORMATION: Date: 03/19/2022 Time: Location: [facility]

## 2022-03-18 ENCOUNTER — Encounter: Payer: Self-pay | Admitting: Gastroenterology

## 2022-03-19 ENCOUNTER — Encounter
Admission: RE | Disposition: A | Discharge status: Home / Self Care | Payer: Self-pay | Source: Ambulatory Visit | Attending: Gastroenterology

## 2022-03-19 ENCOUNTER — Ambulatory Visit: Payer: BC Managed Care – PPO | Admitting: Anesthesiology

## 2022-03-19 ENCOUNTER — Other Ambulatory Visit: Payer: Self-pay

## 2022-03-19 ENCOUNTER — Ambulatory Visit
Admission: RE | Admit: 2022-03-19 | Discharge: 2022-03-19 | Disposition: A | Discharge status: Home / Self Care | Payer: BC Managed Care – PPO | Source: Ambulatory Visit | Attending: Gastroenterology | Admitting: Gastroenterology

## 2022-03-19 ENCOUNTER — Encounter: Payer: Self-pay | Admitting: Gastroenterology

## 2022-03-19 DIAGNOSIS — D124 Benign neoplasm of descending colon: Secondary | ICD-10-CM | POA: Insufficient documentation

## 2022-03-19 DIAGNOSIS — D125 Benign neoplasm of sigmoid colon: Secondary | ICD-10-CM | POA: Insufficient documentation

## 2022-03-19 DIAGNOSIS — K219 Gastro-esophageal reflux disease without esophagitis: Secondary | ICD-10-CM | POA: Diagnosis not present

## 2022-03-19 DIAGNOSIS — Z1211 Encounter for screening for malignant neoplasm of colon: Secondary | ICD-10-CM | POA: Diagnosis not present

## 2022-03-19 DIAGNOSIS — K635 Polyp of colon: Secondary | ICD-10-CM | POA: Diagnosis not present

## 2022-03-19 DIAGNOSIS — J45909 Unspecified asthma, uncomplicated: Secondary | ICD-10-CM | POA: Diagnosis not present

## 2022-03-19 DIAGNOSIS — Z87891 Personal history of nicotine dependence: Secondary | ICD-10-CM | POA: Diagnosis not present

## 2022-03-19 DIAGNOSIS — D126 Benign neoplasm of colon, unspecified: Secondary | ICD-10-CM

## 2022-03-19 DIAGNOSIS — D120 Benign neoplasm of cecum: Secondary | ICD-10-CM | POA: Insufficient documentation

## 2022-03-19 HISTORY — PX: COLONOSCOPY WITH PROPOFOL: SHX5780

## 2022-03-19 SURGERY — COLONOSCOPY WITH PROPOFOL
Anesthesia: General

## 2022-03-19 MED ORDER — PROPOFOL 10 MG/ML IV BOLUS
INTRAVENOUS | Status: DC | PRN
  Administered 2022-03-19 (×2): 100 mg via INTRAVENOUS

## 2022-03-19 MED ORDER — DEXMEDETOMIDINE HCL IN NACL 80 MCG/20ML IV SOLN
INTRAVENOUS | Status: DC | PRN
  Administered 2022-03-19: 4 ug via BUCCAL
  Administered 2022-03-19: 8 ug via BUCCAL
  Administered 2022-03-19: 4 ug via BUCCAL

## 2022-03-19 MED ORDER — SODIUM CHLORIDE 0.9 % IV SOLN: INTRAVENOUS | Status: DC

## 2022-03-19 MED ORDER — MIDAZOLAM HCL 2 MG/2ML IJ SOLN
INTRAMUSCULAR | Status: DC | PRN
  Administered 2022-03-19: 2 mg via INTRAVENOUS

## 2022-03-19 MED ORDER — PROPOFOL 500 MG/50ML IV EMUL
INTRAVENOUS | Status: DC | PRN
  Administered 2022-03-19: 150 ug/kg/min via INTRAVENOUS

## 2022-03-19 MED ORDER — MIDAZOLAM HCL 2 MG/2ML IJ SOLN
INTRAMUSCULAR | Status: AC
  Filled 2022-03-19: qty 2

## 2022-03-19 MED ORDER — LIDOCAINE HCL (CARDIAC) PF 100 MG/5ML IV SOSY
PREFILLED_SYRINGE | INTRAVENOUS | Status: DC | PRN
  Administered 2022-03-19: 100 mg via INTRAVENOUS

## 2022-03-19 NOTE — Anesthesia Postprocedure Evaluation (Signed)
Anesthesia Post Note  Patient: Christopher Davenport  Procedure(s) Performed: COLONOSCOPY WITH PROPOFOL  Patient location during evaluation: Endoscopy Anesthesia Type: General Level of consciousness: awake and alert Pain management: pain level controlled Vital Signs Assessment: post-procedure vital signs reviewed and stable Respiratory status: spontaneous breathing, nonlabored ventilation, respiratory function stable and patient connected to nasal cannula oxygen Cardiovascular status: blood pressure returned to baseline and stable Postop Assessment: no apparent nausea or vomiting Anesthetic complications: no  No notable events documented.   Last Vitals:  Vitals:   03/19/22 0815 03/19/22 0851  BP: 128/89 (!) 90/58  Pulse: 66 61  Resp: 19 18  Temp: 36.8 C (!) 36.3 C  SpO2: 100% 97%    Last Pain:  Vitals:   03/19/22 0851  TempSrc: Temporal  PainSc: Asleep                 Ilene Qua

## 2022-03-19 NOTE — H&P (Signed)
Jonathon Bellows, MD [address] [place], [place], [place], [address] [address], [place], [place], [place], [address] Phone: [phone]  Fax: [phone]  Primary Care Physician:  Valerie Roys, DO   Pre-Procedure History & Physical: HPI:  Christopher Davenport is a 46 y.o. male is here for an colonoscopy.   Past Medical History:  Diagnosis Date   Anxiety    Asthma    GERD (gastroesophageal reflux disease)     History reviewed. No pertinent surgical history.  Prior to Admission medications   Medication Sig Start Date End Date Taking? Authorizing Provider  budesonide-formoterol (SYMBICORT) 160-4.5 MCG/ACT inhaler Inhale 2 puffs into the lungs 2 (two) times daily. 11/05/21  Yes Johnson, Megan P, DO  FLUoxetine (PROZAC) 20 MG capsule Take 1 capsule (20 mg total) by mouth daily. 11/05/21  Yes Johnson, Megan P, DO  albuterol (PROAIR HFA) 108 (90 Base) MCG/ACT inhaler inhale 2 puffs by mouth every 4 to 6 hours if needed for SHORTNESS OF BREATH 11/05/21   Park Liter P, DO  azithromycin (ZITHROMAX) 250 MG tablet Take '500mg'$  PO daily x1d and then '250mg'$  daily x4 days 02/17/22   Mecum, Erin E, PA-C  b complex vitamins tablet Take 1 tablet by mouth daily.    [provider]  busPIRone (BUSPAR) 10 MG tablet TAKE 1 TO 1 1/2 TABLETS(10 TO 15 MG) BY MOUTH THREE TIMES DAILY 11/05/21   Park Liter P, DO  Multiple Vitamin (MULTIVITAMIN WITH MINERALS) TABS tablet Take 1 tablet by mouth daily.    [provider]  vitamin C (ASCORBIC ACID) 500 MG tablet Take 500 mg by mouth daily.    [provider]    Allergies as of 02/25/2022 - Review Complete 02/17/2022  Allergen Reaction Noted   Anoro ellipta [umeclidinium-vilanterol] Diarrhea and Other (See Comments) 01/22/2020    Family History  Problem Relation Age of Onset   Diabetes Mother    Kidney disease Mother    Heart disease Mother    Hypertension Mother    Hyperlipidemia Mother    Cancer Father         lung   Lung cancer Father    Dementia Maternal Grandmother    Diabetes Maternal Grandfather    Diabetes Paternal Grandmother     Social History   Socioeconomic History   Marital status: Married    Spouse name: Not on file   Number of children: Not on file   Years of education: Not on file   Highest education level: Not on file  Occupational History   Not on file  Tobacco Use   Smoking status: Former   Smokeless tobacco: Current    Types: Nurse, children's Use: Never used  Substance and Sexual Activity   Alcohol use: Yes    Comment: on the weekends   Drug use: No   Sexual activity: Yes  Other Topics Concern   Not on file  Social History Narrative   Not on file   Social Determinants of Health   Financial Resource Strain: Not on file  Food Insecurity: Not on file  Transportation Needs: Not on file  Physical Activity: Not on file  Stress: Not on file  Social Connections: Not on file  Intimate Partner Violence: Not on file    Review of Systems: See HPI, otherwise negative ROS  Physical Exam: There were no vitals taken for this visit. General:   Alert,  pleasant and cooperative in NAD  Head:  Normocephalic and atraumatic. Neck:  Supple; no masses or thyromegaly. Lungs:  Clear throughout to auscultation, normal respiratory effort.    Heart:  +S1, +S2, Regular rate and rhythm, No edema. Abdomen:  Soft, nontender and nondistended. Normal bowel sounds, without guarding, and without rebound.   Neurologic:  Alert and  oriented x4;  grossly normal neurologically.  Impression/Plan: Christopher Davenport is here for an colonoscopy to be performed for Screening colonoscopy average risk   Risks, benefits, limitations, and alternatives regarding  colonoscopy have been reviewed with the patient.  Questions have been answered.  All parties agreeable.   Jonathon Bellows, MD  03/19/2022, 8:13 AM

## 2022-03-19 NOTE — Transfer of Care (Signed)
Immediate Anesthesia Transfer of Care Note  Patient: Christopher Davenport  Procedure(s) Performed: COLONOSCOPY WITH PROPOFOL  Patient Location: Endoscopy Unit  Anesthesia Type:General  Level of Consciousness: drowsy  Airway & Oxygen Therapy: Patient Spontanous Breathing  Post-op Assessment: Report given to RN  Post vital signs: stable  Last Vitals:  Vitals Value Taken Time  BP    Temp    Pulse    Resp    SpO2      Last Pain:  Vitals:   03/19/22 0815  TempSrc: Temporal  PainSc: 0-No pain         Complications: No notable events documented.

## 2022-03-19 NOTE — Op Note (Signed)
[facility] Gastroenterology Patient Name: Christopher Davenport Procedure Date: 03/19/2022 8:24 AM MRN: [id number] Account #: [id number] Date of Birth: 02-16-1976 Admit Type: Outpatient Age: 46 Room: [facility] ENDO ROOM 3 Gender: Male Note Status: Finalized Instrument Name: Jasper Riling [id number] Procedure:             Colonoscopy Indications:           Screening for colorectal malignant neoplasm Providers:             Jonathon Bellows MD, MD Referring MD:          Valerie Roys (Referring MD) Medicines:             Monitored Anesthesia Care Complications:         No immediate complications. Procedure:             Pre-Anesthesia Assessment:                        - Prior to the procedure, a History and Physical was                         performed, and patient medications, allergies and                         sensitivities were reviewed. The patient's tolerance                         of previous anesthesia was reviewed.                        - The risks and benefits of the procedure and the                         sedation options and risks were discussed with the                         patient. All questions were answered and informed                         consent was obtained.                        - ASA Grade Assessment: II - A patient with mild                         systemic disease.                        After obtaining informed consent, the colonoscope was                         passed under direct vision. Throughout the procedure,                         the patient's blood pressure, pulse, and oxygen                         saturations were monitored continuously. The                         Colonoscope was introduced  through the anus and                         advanced to the the cecum, identified by the                         appendiceal orifice. The colonoscopy was performed                         with ease. The patient tolerated the procedure well.                          The quality of the bowel preparation was excellent.                         The ileocecal valve, appendiceal orifice, and rectum                         were photographed. Findings:      The perianal and digital rectal examinations were normal.      Two pedunculated polyps were found in the sigmoid colon. The polyps were       12 to 15 mm in size. These polyps were removed with a hot snare.       Resection and retrieval were complete.      Three sessile polyps were found in the cecum. The polyps were 5 to 7 mm       in size. These polyps were removed with a cold snare. Resection and       retrieval were complete.      Two sessile polyps were found in the descending colon. The polyps were 5       to 7 mm in size. These polyps were removed with a cold snare. Resection       and retrieval were complete.      The exam was otherwise without abnormality on direct and retroflexion       views. Impression:            - Two 12 to 15 mm polyps in the sigmoid colon, removed                         with a hot snare. Resected and retrieved.                        - Three 5 to 7 mm polyps in the cecum, removed with a                         cold snare. Resected and retrieved.                        - Two 5 to 7 mm polyps in the descending colon,                         removed with a cold snare. Resected and retrieved.                        - The examination was otherwise normal on direct and  retroflexion views. Recommendation:        - Discharge patient to home (with escort).                        - Resume previous diet.                        - Continue present medications.                        - Await pathology results.                        - Repeat colonoscopy in 3 years for surveillance based                         on pathology results. Procedure Code(s):     --- Professional ---                        [id number], Colonoscopy, flexible; with  removal of                         tumor(s), polyp(s), or other lesion(s) by snare                         technique Diagnosis Code(s):     --- Professional ---                        Z12.11, Encounter for screening for malignant neoplasm                         of colon                        D12.5, Benign neoplasm of sigmoid colon                        D12.0, Benign neoplasm of cecum                        D12.4, Benign neoplasm of descending colon CPT copyright 2022 American Medical Association. All rights reserved. The codes documented in this report are preliminary and upon coder review may  be revised to meet current compliance requirements. Jonathon Bellows, MD Jonathon Bellows MD, MD 03/19/2022 8:49:15 AM This report has been signed electronically. Number of Addenda: 0 Note Initiated On: 03/19/2022 8:24 AM Scope Withdrawal Time: 0 hours 15 minutes 22 seconds  Total Procedure Duration: 0 hours 18 minutes 43 seconds  Estimated Blood Loss:  Estimated blood loss: none.      [facility]

## 2022-03-19 NOTE — Anesthesia Preprocedure Evaluation (Signed)
Anesthesia Evaluation  Patient identified by MRN, date of birth, ID band Patient awake    Reviewed: Allergy & Precautions, NPO status , Patient's Chart, lab work & pertinent test results  Airway Mallampati: III  TM Distance: >3 FB Neck ROM: full    Dental no notable dental hx.    Pulmonary asthma , former smoker   Pulmonary exam normal        Cardiovascular negative cardio ROS Normal cardiovascular exam     Neuro/Psych  PSYCHIATRIC DISORDERS Anxiety     negative neurological ROS     GI/Hepatic Neg liver ROS,GERD  ,,  Endo/Other  negative endocrine ROS    Renal/GU negative Renal ROS  negative genitourinary   Musculoskeletal   Abdominal   Peds  Hematology negative hematology ROS (+)   Anesthesia Other Findings Past Medical History: No date: Anxiety No date: Asthma No date: GERD (gastroesophageal reflux disease)  History reviewed. No pertinent surgical history.     Reproductive/Obstetrics negative OB ROS                             Anesthesia Physical Anesthesia Plan  ASA: 2  Anesthesia Plan: General   Post-op Pain Management: Minimal or no pain anticipated   Induction: Intravenous  PONV Risk Score and Plan: Propofol infusion and TIVA  Airway Management Planned: Natural Airway and Nasal Cannula  Additional Equipment:   Intra-op Plan:   Post-operative Plan:   Informed Consent: I have reviewed the patients History and Physical, chart, labs and discussed the procedure including the risks, benefits and alternatives for the proposed anesthesia with the patient or authorized representative who has indicated his/her understanding and acceptance.     Dental Advisory Given  Plan Discussed with: Anesthesiologist, CRNA and Surgeon  Anesthesia Plan Comments: (Patient consented for risks of anesthesia including but not limited to:  - adverse reactions to medications - risk of  airway placement if required - damage to eyes, teeth, lips or other oral mucosa - nerve damage due to positioning  - sore throat or hoarseness - Damage to heart, brain, nerves, lungs, other parts of body or loss of life  Patient voiced understanding.)       Anesthesia Quick Evaluation

## 2022-03-20 ENCOUNTER — Encounter: Payer: Self-pay | Admitting: Gastroenterology

## 2022-03-20 LAB — SURGICAL PATHOLOGY

## 2022-03-24 ENCOUNTER — Encounter: Payer: Self-pay | Admitting: Gastroenterology

## 2022-04-09 ENCOUNTER — Telehealth: Payer: Self-pay | Admitting: Family Medicine

## 2022-04-09 NOTE — Telephone Encounter (Signed)
Sam from [facility] called to report that Symbicort is not covered by the patient's plan, here are some alternatives that are covered.   Wixela (least expensive) Stan Head (most expensive)   Sam: [phone]

## 2022-04-10 MED ORDER — FLUTICASONE-SALMETEROL 100-50 MCG/ACT IN AEPB: 1.0000 | INHALATION_SPRAY | Freq: Two times a day (BID) | RESPIRATORY_TRACT | 6 refills | Status: DC

## 2022-05-08 ENCOUNTER — Ambulatory Visit: Payer: BC Managed Care – PPO | Admitting: Family Medicine

## 2022-05-13 ENCOUNTER — Ambulatory Visit: Payer: Self-pay | Admitting: *Deleted

## 2022-05-13 NOTE — Telephone Encounter (Signed)
  Chief Complaint: Headache on left side of head that is lingering since Sunday. Symptoms: Headache left side of head, mild sinus congestion on left side, had these headaches before but they don't linger this long.   Requesting nortriptyline 10 mg be prescribed to last until his upcoming appt on Friday with Dr. Wynetta Emery.   Has been on this medication before and it helped.   He is also ok with waiting until he sees Dr. Wynetta Emery Friday. Frequency: Since Sunday Pertinent Negatives: Patient denies N/A Disposition: []$ ED /[]$ Urgent Care (no appt availability in office) / [x]$ Appointment(In office/virtual)/ []$  [facility]/ []$ Home Care/ []$ Refused Recommended Disposition /[]$ [facility]/ []$  Follow-up with PCP Additional Notes: He already has an appt. With Dr. Wynetta Emery this coming Friday 05/15/2022. Message sent to Dr. Wynetta Emery to see if she would be willing to call in nortriptyline 10 mg for him prior to his appt. On Friday.

## 2022-05-13 NOTE — Telephone Encounter (Signed)
Message from [employer] sent at 05/13/2022  8:51 AM EST  Summary: headaches / rx req   The patient shares that they have experienced headaches since Sunday 05/10/22  The patient has been previously prescribed nortriptyline (PAMELOR) 10 MG capsule [id number] and would like to take it again for their headaches  The patient has an appt scheduled for 05/15/22 but would like a modified prescription to help them until the appt  Please contact the patient further when possible          Call History   Type Contact Phone/Fax User  05/13/2022 08:50 AM EST Phone (Incoming) Davenport, Christopher F (Self) [phone] Lemmie Evens) Coley, [name]   Reason for Disposition  Headache started during exertion (e.g., sex, strenuous exercise, heavy lifting)  Answer Assessment - Initial Assessment Questions 1. LOCATION: "Where does it hurt?"      Headache since Sunday I think it's from dehydration.    It hit me all of a sudden.   It's mainly on the left side of my head, like a pressure.    This time the headache has lingered.    That's what is concerning me.  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  I also get organismic headaches.   I think the sweating a lot and drinking beer contributed to being dehydrated. 2. ONSET: "When did the headache start?" (Minutes, hours or days)      Sunday it hit me all of a sudden.     I work out a lot and do [employer] 3. PATTERN: "Does the pain come and go, or has it been constant since it started?"     It's been constant, lingering which is concerning me because the headaches don't usually go on this long.     4. SEVERITY: "How bad is the pain?" and "What does it keep you from doing?"  (e.g., Scale 1-10; mild, moderate, or severe)   - MILD (1-3): doesn't interfere with normal activities    - MODERATE (4-7): interferes with normal activities or awakens from sleep    - SEVERE (8-10): excruciating pain, unable to do any normal  activities        Moderate 5. RECURRENT SYMPTOM: "Have you ever had headaches before?" If Yes, ask: "When was the last time?" and "What happened that time?"      Yes    I was given nortriptyline 10 mg which helped.    I would like to have enough of those prescribed for me to help until I see Dr. Wynetta Emery on Friday. 6. CAUSE: "What do you think is causing the headache?"     Dehydration and organismic headaches.    7. MIGRAINE: "Have you been diagnosed with migraine headaches?" If Yes, ask: "Is this headache similar?"      Not migraines but I've had these headaches before and was prescribed the nortriptyline. 8. 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 OTHER SYMPTOMS: "Do you have any other symptoms?" (fever, stiff neck, eye pain, sore throat, cold symptoms)     Neck a little sore and stiff.   A little sinus congestion on left side. 10. PREGNANCY: "Is there any chance you are pregnant?" "When was your last menstrual period?"       N/A  Protocols used: Headache-A-AH

## 2022-05-15 ENCOUNTER — Encounter: Payer: Self-pay | Admitting: Family Medicine

## 2022-05-15 ENCOUNTER — Ambulatory Visit (INDEPENDENT_AMBULATORY_CARE_PROVIDER_SITE_OTHER): Payer: BC Managed Care – PPO | Admitting: Family Medicine

## 2022-05-15 VITALS — BP 116/71 | HR 77 | Temp 98.1°F | Ht 72.0 in | Wt 220.2 lb

## 2022-05-15 DIAGNOSIS — J4520 Mild intermittent asthma, uncomplicated: Secondary | ICD-10-CM

## 2022-05-15 DIAGNOSIS — G43001 Migraine without aura, not intractable, with status migrainosus: Secondary | ICD-10-CM | POA: Diagnosis not present

## 2022-05-15 DIAGNOSIS — F411 Generalized anxiety disorder: Secondary | ICD-10-CM | POA: Diagnosis not present

## 2022-05-15 MED ORDER — BUSPIRONE HCL 10 MG PO TABS: ORAL_TABLET | ORAL | 1 refills | Status: DC

## 2022-05-15 MED ORDER — FLUOXETINE HCL 20 MG PO CAPS: 20.0000 mg | ORAL_CAPSULE | Freq: Every day | ORAL | 1 refills | Status: DC

## 2022-05-15 MED ORDER — NORTRIPTYLINE HCL 10 MG PO CAPS: 10.0000 mg | ORAL_CAPSULE | Freq: Every day | ORAL | 1 refills | Status: DC

## 2022-05-15 MED ORDER — ALBUTEROL SULFATE HFA 108 (90 BASE) MCG/ACT IN AERS: INHALATION_SPRAY | RESPIRATORY_TRACT | 6 refills | Status: DC

## 2022-05-15 MED ORDER — FLUTICASONE-SALMETEROL 100-50 MCG/ACT IN AEPB: 1.0000 | INHALATION_SPRAY | Freq: Two times a day (BID) | RESPIRATORY_TRACT | 6 refills | Status: DC

## 2022-05-15 MED ORDER — KETOROLAC TROMETHAMINE 60 MG/2ML IM SOLN
60.0000 mg | Freq: Once | INTRAMUSCULAR | Status: AC
  Administered 2022-05-15: 60 mg via INTRAMUSCULAR

## 2022-05-15 MED ORDER — CYCLOBENZAPRINE HCL 10 MG PO TABS: 10.0000 mg | ORAL_TABLET | Freq: Every day | ORAL | 0 refills | Status: DC

## 2022-05-15 NOTE — Assessment & Plan Note (Signed)
Chronic, under good control. Continue taking Fluoxetine '20mg'$  daily and Buspar PRN. Refills done today. Return in 6 months.

## 2022-05-15 NOTE — Progress Notes (Signed)
BP 116/71   Pulse 77   Temp 98.1 F (36.7 C) (Oral)   Ht 6' (1.829 m)   Wt 220 lb 3.2 oz (99.9 kg)   SpO2 98%   BMI 29.86 kg/m    Subjective:    Patient ID: Christopher Davenport, male    DOB: 1975/04/05, 47 y.o.   MRN: [id number]  HPI: Christopher Davenport is a 47 y.o. male  Chief Complaint  Patient presents with   Anxiety   Headache    Patient says he is noticing some tension headaches and he says he spoke with [facility] and was requesting medications for the headaches. Patient says about a week ago, he was severely dehydrated and had a severe headache hit him and he has had a lingering headache. Patient says he has tried Aspirin and Ibuprofen.    ANXIETY/STRESS Taking Buspar PRN and Fluoxetine daily  Duration:stable Anxious mood: no  Excessive worrying: no Irritability: yes  Sweating: no Nausea: no Palpitations:no Hyperventilation: no Panic attacks: no Agoraphobia: no  Obscessions/compulsions: no Depressed mood: no    05/15/2022    9:41 AM 12/22/2021    2:20 PM 11/05/2021    9:50 AM 02/17/2021    8:19 AM 08/15/2020    8:12 AM  Depression screen PHQ 2/9  Decreased Interest 2 0 1 1 0  Down, Depressed, Hopeless 0 0 1 1 0  PHQ - 2 Score 2 0 2 2 0  Altered sleeping 0 '2 2 2 3  '$ Tired, decreased energy '3 2 2 1 3  '$ Change in appetite 0 0 1 1 0  Feeling bad or failure about yourself  0 0 0 0 0  Trouble concentrating '3 2 2 1 1  '$ Moving slowly or fidgety/restless '1 1 2 '$ 0 0  Suicidal thoughts 0 0 0 0 0  PHQ-9 Score '9 7 11 7 7  '$ Difficult doing work/chores Very difficult Somewhat difficult Somewhat difficult  Not difficult at all   Anhedonia: no Weight changes: no Insomnia: no   Hypersomnia: no Fatigue/loss of energy: yes Feelings of worthlessness: no Feelings of guilt: no Impaired concentration/indecisiveness: yes Suicidal ideations: no  Crying spells: no Recent Stressors/Life Changes: no   Relationship problems: no   Family stress: yes     Financial stress: no    Job  stress: no    Recent death/loss: no   HEADACHE Had a few alcoholic beverages over the weekend, started feeling dehydrated, the headache came on suddenly. He has had a similar experience to this back in 2016, he saw neurology and tried Nortriptyline which helped.  Duration:5 days Onset: sudden Severity: 7/10 Quality: dull, aching, pressure-like, and stabbing Frequency: constant Location: Center at the top of head towards back.  Headache duration: all day Radiation: no Time of day headache occurs:  Alleviating factors: x3-4 81 mg aspirin, laying down after taking aspirin.  Aggravating factors: turning head to right and left, looking down for an extended period of time (works from home), standing up (more intense feeling ) Headache status at time of visit: current headache Treatments attempted: Treatments attempted:  Ibuprofen and x3-4 81 mg aspirin    Aura: no Nausea:  no Vomiting: no Photophobia:  Yes Phonophobia:  no Effect on social functioning:  yes Numbers of missed days of school/work each month: No Confusion:  no Gait disturbance/ataxia:  no Behavioral changes:   more grumpy  Fevers:  no   Relevant past medical, surgical, family and social history reviewed and updated as indicated. Interim  medical history since our last visit reviewed. Allergies and medications reviewed and updated.  Review of Systems  Constitutional: Negative.   HENT: Negative.    Eyes:  Positive for photophobia. Negative for pain, discharge, redness, itching and visual disturbance.  Respiratory: Negative.    Cardiovascular: Negative.   Gastrointestinal: Negative.   Neurological:  Positive for headaches. Negative for dizziness, tremors, seizures, syncope, facial asymmetry, speech difficulty, weakness, light-headedness and numbness.    Per HPI unless specifically indicated above     Objective:    BP 116/71   Pulse 77   Temp 98.1 F (36.7 C) (Oral)   Ht 6' (1.829 m)   Wt 220 lb 3.2 oz (99.9  kg)   SpO2 98%   BMI 29.86 kg/m   Wt Readings from Last 3 Encounters:  05/15/22 220 lb 3.2 oz (99.9 kg)  03/19/22 213 lb 9.6 oz (96.9 kg)  11/05/21 219 lb 9.6 oz (99.6 kg)    Physical Exam Vitals and nursing note reviewed.  Constitutional:      General: He is awake. He is not in acute distress.    Appearance: He is well-developed and well-groomed. He is obese. He is not ill-appearing.  HENT:     Head: Normocephalic and atraumatic.     Right Ear: Hearing and external ear normal. No drainage.     Left Ear: Hearing and external ear normal. No drainage.     Nose: Nose normal.  Eyes:     General: Lids are normal.        Right eye: No discharge.        Left eye: No discharge.     Extraocular Movements: Extraocular movements intact.     Conjunctiva/sclera: Conjunctivae normal.     Pupils: Pupils are equal, round, and reactive to light.  Cardiovascular:     Rate and Rhythm: Normal rate and regular rhythm.     Pulses:          Carotid pulses are 2+ on the right side and 2+ on the left side.    Heart sounds: Normal heart sounds, S1 normal and S2 normal. No murmur heard.    No friction rub. No gallop.  Pulmonary:     Effort: No accessory muscle usage or respiratory distress.     Breath sounds: Normal breath sounds.  Musculoskeletal:        General: Normal range of motion.     Cervical back: Normal range of motion. Muscular tenderness present.     Right lower leg: No edema.     Left lower leg: No edema.  Skin:    General: Skin is warm and dry.     Capillary Refill: Capillary refill takes less than 2 seconds.  Neurological:     Mental Status: He is alert and oriented to person, place, and time.  Psychiatric:        Attention and Perception: Attention normal.        Mood and Affect: Mood is anxious.        Speech: Speech normal.        Behavior: Behavior normal. Behavior is cooperative.        Thought Content: Thought content normal.     Results for orders placed or performed  during the hospital encounter of 03/19/22  Surgical pathology  Result Value Ref Range   SURGICAL PATHOLOGY      SURGICAL PATHOLOGY CASE: [id number] PATIENT: Christopher Davenport Surgical Pathology Report     Specimen Submitted: A. Colon polyp  x3, cecum; cold snare B. Colon polyp x2, descending; cold snare C. Colon polyp x2, sigmoid; hot snare  Clinical History: Screening for colon cancer      DIAGNOSIS: A.  COLON POLYP X 3, CECUM; COLD SNARE: - TUBULAR ADENOMA (MULTIPLE FRAGMENTS). - SESSILE SERRATED POLYP (MULTIPLE FRAGMENTS). - NEGATIVE FOR HIGH-GRADE DYSPLASIA AND MALIGNANCY.  B.  COLON POLYP X 2, DESCENDING; COLD SNARE: - TUBULAR ADENOMA (MULTIPLE FRAGMENTS). - HYPERPLASTIC POLYP (1). - NEGATIVE FOR HIGH-GRADE DYSPLASIA AND MALIGNANCY.  C.  COLON POLYP X 2, SIGMOID; HOT SNARE: - TUBULAR ADENOMA (MULTIPLE FRAGMENTS). - NEGATIVE FOR HIGH-GRADE DYSPLASIA AND MALIGNANCY.  GROSS DESCRIPTION: A. Labeled: Cold snared cecal polyp x 3 Received: Formalin Collection time: 8:14 AM on 03/19/2022 Placed into formalin time: 8:14 AM on 03/19/2022 Tissue fragment(s): Mult iple Size: Aggregate, 1.2 x 0.4 x 0.1 cm Description: Tan soft tissue fragments Entirely submitted in 1 cassette.  B. Labeled: Cold snared descending colon polyp x 2 Received: Formalin Collection time: 8:40 AM on 03/19/2022 Placed into formalin time: 8:40 AM on 03/19/2022 Tissue fragment(s): Multiple Size: Aggregate, 1.3 x 0.3 x 0.1 cm Description: Tan soft tissue fragments Entirely submitted in 1 cassette.  C. Labeled: Hot snared sigmoid colon polyps x 2 Received: Formalin Collection time: 8:42 AM on 03/19/2022 Placed into formalin time: 8:42 AM on 03/19/2022 Tissue fragment(s): Multiple Size: Aggregate, 2.0 x 1.0 x 0.2 cm Description: Pink soft tissue fragments Entirely submitted in 1 cassette.  CM 03/19/2022  Final Diagnosis performed by Quay Burow, MD.   Electronically signed 03/20/2022  8:54:30AM The electronic signature indicates that the named Attending Pathologist has evaluated the specimen Technical component performed at [facility], [address]  [place], [place] [address] Lab: [phone] Dir: Rush Farmer, MD, MMM  Professional component performed at [facility], [facility], [place], [place], [place] [address] Lab: [phone] Dir: Kathi Simpers, MD       Assessment & Plan:   Problem List Items Addressed This Visit       Cardiovascular and Mediastinum   Migraine without aura and with status migrainosus, not intractable - Primary    Acute. Uncontrolled. Toradol injection given in office today to help with relief. Use 10 mg tablet of Flexeril to help with muscle tension in neck as needed before using Nortriptyline. Start using 10 mg Nortriptyline as needed if no relief provided from Toradol injection and Flexeril. Follow up as needed if symptoms do not improve.       Relevant Medications   nortriptyline (PAMELOR) 10 MG capsule   cyclobenzaprine (FLEXERIL) 10 MG tablet   FLUoxetine (PROZAC) 20 MG capsule     Respiratory   Asthma    Under good control on current regimen. Continue current regimen. Continue to monitor. Call with any concerns. Refills given.        Relevant Medications   albuterol (PROAIR HFA) 108 (90 Base) MCG/ACT inhaler   fluticasone-salmeterol (WIXELA INHUB) 100-50 MCG/ACT AEPB     Other   Anxiety disorder    Chronic, under good control. Continue taking Fluoxetine '20mg'$  daily and Buspar PRN. Refills done today. Return in 6 months.        Relevant Medications   nortriptyline (PAMELOR) 10 MG capsule   busPIRone (BUSPAR) 10 MG tablet   FLUoxetine (PROZAC) 20 MG capsule     Follow up plan: Return in about 4 weeks (around 06/12/2022) for Headache symptoms.

## 2022-05-15 NOTE — Assessment & Plan Note (Signed)
Under good control on current regimen. Continue current regimen. Continue to monitor. Call with any concerns. Refills given.   

## 2022-05-15 NOTE — Assessment & Plan Note (Signed)
Acute. Uncontrolled. Toradol injection given in office today to help with relief. Use 10 mg tablet of Flexeril to help with muscle tension in neck as needed before using Nortriptyline. Start using 10 mg Nortriptyline as needed if no relief provided from Toradol injection and Flexeril. Follow up in about a month.

## 2022-05-15 NOTE — Progress Notes (Unsigned)
BP 116/71   Pulse 77   Temp 98.1 F (36.7 C) (Oral)   Ht 6' (1.829 m)   Wt 220 lb 3.2 oz (99.9 kg)   SpO2 98%   BMI 29.86 kg/m    Subjective:    Patient ID: Christopher Davenport, male    DOB: 1975/12/09, 47 y.o.   MRN: [id number]  HPI: Christopher Davenport is a 47 y.o. male  Chief Complaint  Patient presents with   Anxiety   Headache    Patient says he is noticing some tension headaches and he says he spoke with [facility] and was requesting medications for the headaches. Patient says about a week ago, he was severely dehydrated and had a severe headache hit him and he has had a lingering headache. Patient says he has tried Aspirin and Ibuprofen.    HEADACHE Duration: 1 week Onset: sudden Severity: 7/10 Quality: aching Frequency: constant Location: middle of his head Headache duration: constant Radiation: into his neck Headache status at time of visit: current headache Treatments attempted: Treatments attempted: aspirin and ibuprofen   Aura: no Nausea:  no Vomiting: no Photophobia:  yes Phonophobia:  no Effect on social functioning:  yes Confusion:  no Gait disturbance/ataxia:  no Behavioral changes:  no Fevers:  no  ANXIETY/STRESS Duration: chronic Status:controlled Anxious mood: yes  Excessive worrying: no Irritability: no  Sweating: no Nausea: no Palpitations:no Hyperventilation: no Panic attacks: no Agoraphobia: no  Obscessions/compulsions: no Depressed mood: no    05/15/2022    9:41 AM 12/22/2021    2:20 PM 11/05/2021    9:50 AM 02/17/2021    8:19 AM 08/15/2020    8:12 AM  Depression screen PHQ 2/9  Decreased Interest 2 0 1 1 0  Down, Depressed, Hopeless 0 0 1 1 0  PHQ - 2 Score 2 0 2 2 0  Altered sleeping 0 '2 2 2 3  '$ Tired, decreased energy '3 2 2 1 3  '$ Change in appetite 0 0 1 1 0  Feeling bad or failure about yourself  0 0 0 0 0  Trouble concentrating '3 2 2 1 1  '$ Moving slowly or fidgety/restless '1 1 2 '$ 0 0  Suicidal thoughts 0 0 0 0 0  PHQ-9 Score '9 7  11 7 7  '$ Difficult doing work/chores Very difficult Somewhat difficult Somewhat difficult  Not difficult at all      05/15/2022    9:41 AM 12/22/2021    2:21 PM 11/05/2021    9:50 AM 02/17/2021    8:19 AM  GAD 7 : Generalized Anxiety Score  Nervous, Anxious, on Edge '1 2 2 3  '$ Control/stop worrying 0 '2 2 2  '$ Worry too much - different things 0 '2 2 2  '$ Trouble relaxing 0 '2 2 2  '$ Restless 0 0 1 0  Easily annoyed or irritable '1 2 2 1  '$ Afraid - awful might happen 1 0 0 0  Total GAD 7 Score '3 10 11 10  '$ Anxiety Difficulty Very difficult Somewhat difficult Somewhat difficult Somewhat difficult   Anhedonia: no Weight changes: no Insomnia: no   Hypersomnia: no Fatigue/loss of energy: no Feelings of worthlessness: no Feelings of guilt: no Impaired concentration/indecisiveness: no Suicidal ideations: no  Crying spells: no Recent Stressors/Life Changes: no   Relationship problems: no   Family stress: no     Financial stress: no    Job stress: no    Recent death/loss: no   Relevant past medical, surgical, family and social history  reviewed and updated as indicated. Interim medical history since our last visit reviewed. Allergies and medications reviewed and updated.  Review of Systems  Constitutional:  Positive for fatigue. Negative for activity change, appetite change, chills, diaphoresis, fever and unexpected weight change.  Respiratory: Negative.    Cardiovascular: Negative.   Gastrointestinal: Negative.   Musculoskeletal: Negative.   Neurological:  Positive for headaches. Negative for dizziness, tremors, seizures, syncope, facial asymmetry, speech difficulty, weakness, light-headedness and numbness.  Psychiatric/Behavioral: Negative.      Per HPI unless specifically indicated above     Objective:    BP 116/71   Pulse 77   Temp 98.1 F (36.7 C) (Oral)   Ht 6' (1.829 m)   Wt 220 lb 3.2 oz (99.9 kg)   SpO2 98%   BMI 29.86 kg/m   Wt Readings from Last 3 Encounters:   05/15/22 220 lb 3.2 oz (99.9 kg)  03/19/22 213 lb 9.6 oz (96.9 kg)  11/05/21 219 lb 9.6 oz (99.6 kg)    Physical Exam Vitals and nursing note reviewed.  Constitutional:      General: He is not in acute distress.    Appearance: Normal appearance. He is well-developed and normal weight. He is not ill-appearing, toxic-appearing or diaphoretic.  HENT:     Head: Normocephalic and atraumatic.     Right Ear: External ear normal.     Left Ear: External ear normal.     Nose: Nose normal.     Mouth/Throat:     Mouth: Mucous membranes are moist.     Pharynx: Oropharynx is clear.  Eyes:     General: No scleral icterus.       Right eye: No discharge.        Left eye: No discharge.     Extraocular Movements: Extraocular movements intact.     Conjunctiva/sclera: Conjunctivae normal.     Pupils: Pupils are equal, round, and reactive to light.  Neck:     Comments: Hypertonic muscles in his neck Cardiovascular:     Rate and Rhythm: Normal rate and regular rhythm.     Pulses: Normal pulses.     Heart sounds: Normal heart sounds. No murmur heard.    No friction rub. No gallop.  Pulmonary:     Effort: Pulmonary effort is normal. No respiratory distress.     Breath sounds: Normal breath sounds. No stridor. No wheezing, rhonchi or rales.  Chest:     Chest wall: No tenderness.  Musculoskeletal:        General: Normal range of motion.     Cervical back: Normal range of motion and neck supple.  Skin:    General: Skin is warm and dry.     Capillary Refill: Capillary refill takes less than 2 seconds.     Coloration: Skin is not jaundiced or pale.     Findings: No bruising, erythema, lesion or rash.  Neurological:     General: No focal deficit present.     Mental Status: He is alert and oriented to person, place, and time. Mental status is at baseline.  Psychiatric:        Mood and Affect: Mood normal.        Behavior: Behavior normal.        Thought Content: Thought content normal.         Judgment: Judgment normal.     Results for orders placed or performed during the hospital encounter of 03/19/22  Surgical pathology  Result Value Ref Range  SURGICAL PATHOLOGY      SURGICAL PATHOLOGY CASE: [id number] PATIENT: Christopher Davenport Surgical Pathology Report     Specimen Submitted: A. Colon polyp x3, cecum; cold snare B. Colon polyp x2, descending; cold snare C. Colon polyp x2, sigmoid; hot snare  Clinical History: Screening for colon cancer      DIAGNOSIS: A.  COLON POLYP X 3, CECUM; COLD SNARE: - TUBULAR ADENOMA (MULTIPLE FRAGMENTS). - SESSILE SERRATED POLYP (MULTIPLE FRAGMENTS). - NEGATIVE FOR HIGH-GRADE DYSPLASIA AND MALIGNANCY.  B.  COLON POLYP X 2, DESCENDING; COLD SNARE: - TUBULAR ADENOMA (MULTIPLE FRAGMENTS). - HYPERPLASTIC POLYP (1). - NEGATIVE FOR HIGH-GRADE DYSPLASIA AND MALIGNANCY.  C.  COLON POLYP X 2, SIGMOID; HOT SNARE: - TUBULAR ADENOMA (MULTIPLE FRAGMENTS). - NEGATIVE FOR HIGH-GRADE DYSPLASIA AND MALIGNANCY.  GROSS DESCRIPTION: A. Labeled: Cold snared cecal polyp x 3 Received: Formalin Collection time: 8:14 AM on 03/19/2022 Placed into formalin time: 8:14 AM on 03/19/2022 Tissue fragment(s): Mult iple Size: Aggregate, 1.2 x 0.4 x 0.1 cm Description: Tan soft tissue fragments Entirely submitted in 1 cassette.  B. Labeled: Cold snared descending colon polyp x 2 Received: Formalin Collection time: 8:40 AM on 03/19/2022 Placed into formalin time: 8:40 AM on 03/19/2022 Tissue fragment(s): Multiple Size: Aggregate, 1.3 x 0.3 x 0.1 cm Description: Tan soft tissue fragments Entirely submitted in 1 cassette.  C. Labeled: Hot snared sigmoid colon polyps x 2 Received: Formalin Collection time: 8:42 AM on 03/19/2022 Placed into formalin time: 8:42 AM on 03/19/2022 Tissue fragment(s): Multiple Size: Aggregate, 2.0 x 1.0 x 0.2 cm Description: Pink soft tissue fragments Entirely submitted in 1 cassette.  CM 03/19/2022  Final  Diagnosis performed by Quay Burow, MD.   Electronically signed 03/20/2022 8:54:30AM The electronic signature indicates that the named Attending Pathologist has evaluated the specimen Technical component performed at [facility], [address]  [place], [place] [address] Lab: [phone] Dir: Rush Farmer, MD, MMM  Professional component performed at [facility], [facility], [place], [place], [place] [address] Lab: [phone] Dir: Kathi Simpers, MD       Assessment & Plan:   Problem List Items Addressed This Visit       Cardiovascular and Mediastinum   Migraine without aura and with status migrainosus, not intractable - Primary     Follow up plan: No follow-ups on file.

## 2022-05-26 ENCOUNTER — Ambulatory Visit: Payer: Self-pay | Admitting: *Deleted

## 2022-05-26 NOTE — Telephone Encounter (Signed)
He should not use both- I think they stopped covering the symbicort, so he was switched to the wixella

## 2022-05-26 NOTE — Telephone Encounter (Signed)
Patient was notified and verbalized understanding. Patient was advised to give our office a call back if any questions or concerns.

## 2022-05-26 NOTE — Telephone Encounter (Signed)
Summary: Med management.   Pt stated he is currently using SYMBICORT 160-4.5 MCG/ACT inhaler The pharmacy advised him to pick up fluticasone-salmeterol (WIXELA INHUB) 100-50 MCG/ACT AEPB Pt is asking if he should use both or switch to fluticasone-salmeterol (WIXELA INHUB) 100-50 MCG/ACT AEPB  Seeking clinical advice.       Chief Complaint: please clarify which medications patient is to be taking. Currently taking symbicort 2 puffs 2 times daily and not on medication list.  Symptoms: none. Pharmacy requesting patient to clarify inhalers to take  Frequency: na Pertinent Negatives: Patient denies issues with breathing  Disposition: '[]'$ ED /'[]'$ Urgent Care (no appt availability in office) / '[]'$ Appointment(In office/virtual)/ '[]'$  [facility]/ '[]'$ Home Care/ '[]'$ Refused Recommended Disposition /'[]'$ [facility]/ '[x]'$  Follow-up with PCP Additional Notes:   Please clarify which inhalers patient should be taking. Albuterol proair and wixela inhub and / or symbicort. Please advise.       Reason for Disposition  [1] Caller has NON-URGENT medicine question about med that PCP prescribed AND [2] triager unable to answer question  Answer Assessment - Initial Assessment Questions 1. NAME of MEDICINE: "What medicine(s) are you calling about?"  fluticasone-salmeterol (WIXELA INHUB) 100-50 MCG/ACT AEPB 1 puff, 2 times daily albuterol (PROAIR HFA) 108 (90 Base) MCG/ACT inhaler inhale 2 puffs by mouth every 4 to 6 hours if needed for SHORTNESS OF BREATH Symbicort inhaler 2 puffs by mouth 2 times a day  2. QUESTION: "What is your question?" (e.g., double dose of medicine, side effect)     Which inhalers should patient be taking? 3. PRESCRIBER: "Who prescribed the medicine?" Reason: if prescribed by specialist, call should be referred to that group.     PCP 4. SYMPTOMS: "Do you have any symptoms?" If Yes, ask: "What symptoms are you having?"  "How bad are the symptoms (e.g., mild, moderate,  severe)     none 5. PREGNANCY:  "Is there any chance that you are pregnant?" "When was your last menstrual period?"     na  Protocols used: Medication Question Call-A-AH

## 2022-06-15 ENCOUNTER — Ambulatory Visit (INDEPENDENT_AMBULATORY_CARE_PROVIDER_SITE_OTHER): Payer: BC Managed Care – PPO | Admitting: Family Medicine

## 2022-06-15 ENCOUNTER — Encounter: Payer: Self-pay | Admitting: Family Medicine

## 2022-06-15 VITALS — BP 112/75 | HR 66 | Temp 97.6°F | Ht 72.0 in | Wt 227.8 lb

## 2022-06-15 DIAGNOSIS — G43001 Migraine without aura, not intractable, with status migrainosus: Secondary | ICD-10-CM

## 2022-06-15 DIAGNOSIS — N529 Male erectile dysfunction, unspecified: Secondary | ICD-10-CM | POA: Diagnosis not present

## 2022-06-15 MED ORDER — TADALAFIL 20 MG PO TABS: 10.0000 mg | ORAL_TABLET | ORAL | 11 refills | Status: DC | PRN

## 2022-06-15 NOTE — Progress Notes (Signed)
BP 112/75   Pulse 66   Temp 97.6 F (36.4 C) (Oral)   Ht 6' (1.829 m)   Wt 227 lb 12.8 oz (103.3 kg)   SpO2 99%   BMI 30.90 kg/m    Subjective:    Patient ID: Christopher Davenport, male    DOB: 02-24-1976, 47 y.o.   MRN: [id number]  HPI: Christopher Davenport is a 47 y.o. male  Chief Complaint  Patient presents with   Headache    Patient says his headaches are a lot better. Patient says he has been taking the Nortriptyline and going to the Chiropractor. Patient says he realizes it was more of muscle straining.    Headaches are pretty much gone. He has been taking the nortritpyline which has been helping him sleep and helping with the headaches. He has also been seeing the chiropractor which has been helping with the neck tightness. He is still having a little tension in his neck. He's also icing and stretching his neck daily.   He has been having some issues with ED and would like to try something for it. He does think that it is in part from anxiety. No other concerns or complaints at this time.   Relevant past medical, surgical, family and social history reviewed and updated as indicated. Interim medical history since our last visit reviewed. Allergies and medications reviewed and updated.  Review of Systems  Constitutional: Negative.   Respiratory: Negative.    Cardiovascular: Negative.   Musculoskeletal:  Positive for myalgias, neck pain and neck stiffness. Negative for arthralgias, back pain, gait problem and joint swelling.  Skin: Negative.   Neurological: Negative.   Psychiatric/Behavioral: Negative.      Per HPI unless specifically indicated above     Objective:    BP 112/75   Pulse 66   Temp 97.6 F (36.4 C) (Oral)   Ht 6' (1.829 m)   Wt 227 lb 12.8 oz (103.3 kg)   SpO2 99%   BMI 30.90 kg/m   Wt Readings from Last 3 Encounters:  06/15/22 227 lb 12.8 oz (103.3 kg)  05/15/22 220 lb 3.2 oz (99.9 kg)  03/19/22 213 lb 9.6 oz (96.9 kg)    Physical Exam Vitals  and nursing note reviewed.  Constitutional:      General: He is not in acute distress.    Appearance: Normal appearance. He is not ill-appearing, toxic-appearing or diaphoretic.  HENT:     Head: Normocephalic and atraumatic.     Right Ear: External ear normal.     Left Ear: External ear normal.     Nose: Nose normal.     Mouth/Throat:     Mouth: Mucous membranes are moist.     Pharynx: Oropharynx is clear.  Eyes:     General: No scleral icterus.       Right eye: No discharge.        Left eye: No discharge.     Extraocular Movements: Extraocular movements intact.     Conjunctiva/sclera: Conjunctivae normal.     Pupils: Pupils are equal, round, and reactive to light.  Cardiovascular:     Rate and Rhythm: Normal rate and regular rhythm.     Pulses: Normal pulses.     Heart sounds: Normal heart sounds. No murmur heard.    No friction rub. No gallop.  Pulmonary:     Effort: Pulmonary effort is normal. No respiratory distress.     Breath sounds: Normal breath sounds. No stridor. No  wheezing, rhonchi or rales.  Chest:     Chest wall: No tenderness.  Musculoskeletal:        General: Normal range of motion.     Cervical back: Normal range of motion and neck supple.  Skin:    General: Skin is warm and dry.     Capillary Refill: Capillary refill takes less than 2 seconds.     Coloration: Skin is not jaundiced or pale.     Findings: No bruising, erythema, lesion or rash.  Neurological:     General: No focal deficit present.     Mental Status: He is alert and oriented to person, place, and time. Mental status is at baseline.  Psychiatric:        Mood and Affect: Mood normal.        Behavior: Behavior normal.        Thought Content: Thought content normal.        Judgment: Judgment normal.     Results for orders placed or performed during the hospital encounter of 03/19/22  Surgical pathology  Result Value Ref Range   SURGICAL PATHOLOGY      SURGICAL PATHOLOGY CASE:  [id number] PATIENT: Dale Greenwood Surgical Pathology Report     Specimen Submitted: A. Colon polyp x3, cecum; cold snare B. Colon polyp x2, descending; cold snare C. Colon polyp x2, sigmoid; hot snare  Clinical History: Screening for colon cancer      DIAGNOSIS: A.  COLON POLYP X 3, CECUM; COLD SNARE: - TUBULAR ADENOMA (MULTIPLE FRAGMENTS). - SESSILE SERRATED POLYP (MULTIPLE FRAGMENTS). - NEGATIVE FOR HIGH-GRADE DYSPLASIA AND MALIGNANCY.  B.  COLON POLYP X 2, DESCENDING; COLD SNARE: - TUBULAR ADENOMA (MULTIPLE FRAGMENTS). - HYPERPLASTIC POLYP (1). - NEGATIVE FOR HIGH-GRADE DYSPLASIA AND MALIGNANCY.  C.  COLON POLYP X 2, SIGMOID; HOT SNARE: - TUBULAR ADENOMA (MULTIPLE FRAGMENTS). - NEGATIVE FOR HIGH-GRADE DYSPLASIA AND MALIGNANCY.  GROSS DESCRIPTION: A. Labeled: Cold snared cecal polyp x 3 Received: Formalin Collection time: 8:14 AM on 03/19/2022 Placed into formalin time: 8:14 AM on 03/19/2022 Tissue fragment(s): Mult iple Size: Aggregate, 1.2 x 0.4 x 0.1 cm Description: Tan soft tissue fragments Entirely submitted in 1 cassette.  B. Labeled: Cold snared descending colon polyp x 2 Received: Formalin Collection time: 8:40 AM on 03/19/2022 Placed into formalin time: 8:40 AM on 03/19/2022 Tissue fragment(s): Multiple Size: Aggregate, 1.3 x 0.3 x 0.1 cm Description: Tan soft tissue fragments Entirely submitted in 1 cassette.  C. Labeled: Hot snared sigmoid colon polyps x 2 Received: Formalin Collection time: 8:42 AM on 03/19/2022 Placed into formalin time: 8:42 AM on 03/19/2022 Tissue fragment(s): Multiple Size: Aggregate, 2.0 x 1.0 x 0.2 cm Description: Pink soft tissue fragments Entirely submitted in 1 cassette.  CM 03/19/2022  Final Diagnosis performed by Quay Burow, MD.   Electronically signed 03/20/2022 8:54:30AM The electronic signature indicates that the named Attending Pathologist has evaluated the specimen Technical component performed at  [facility], [address]  [place], [place] [address] Lab: [phone] Dir: Rush Farmer, MD, MMM  Professional component performed at [facility], [place], [place], [place] [address] Lab: [phone] Dir: Kathi Simpers, MD       Assessment & Plan:   Problem List Items Addressed This Visit       Cardiovascular and Mediastinum   Migraine without aura and with status migrainosus, not intractable - Primary    Resolved. Doing well on the nortriptyline. Continue to monitor. Call with any concerns.  Relevant Medications   tadalafil (CIALIS) 20 MG tablet     Other   Erectile dysfunction    Will treat with cialis. Information provided. Call with any concerns.         Follow up plan: Return in about 5 months (around 11/15/2022) for physical.

## 2022-06-15 NOTE — Assessment & Plan Note (Signed)
Resolved. Doing well on the nortriptyline. Continue to monitor. Call with any concerns.

## 2022-06-15 NOTE — Assessment & Plan Note (Signed)
Will treat with cialis. Information provided. Call with any concerns.

## 2022-07-07 ENCOUNTER — Other Ambulatory Visit: Payer: Self-pay | Admitting: Family Medicine

## 2022-07-08 ENCOUNTER — Other Ambulatory Visit: Payer: Self-pay | Admitting: Family Medicine

## 2022-07-08 NOTE — Telephone Encounter (Signed)
Unable to refill per protocol, Rx expired. Discontinued 10/29/21.  Requested Prescriptions  Pending Prescriptions Disp Refills   BREYNA 160-4.5 MCG/ACT inhaler [Pharmacy Med Name: BREYNA 160/4. ORAL INH(120 INH)] 30.9 g     Sig: INHALE 2 PUFFS INTO THE LUNGS TWICE DAILY     Pulmonology:  Combination Products Passed - 07/07/2022  7:48 PM      Passed - Valid encounter within last 12 months    Recent Outpatient Visits           3 weeks ago Migraine without aura and with status migrainosus, not intractable   [place] [facility] [place], Megan P, DO   1 month ago Migraine without aura and with status migrainosus, not intractable   [place] [facility] [place], Megan P, DO   4 months ago Bronchitis with asthma, acute   [place] [facility] Mecum, Erin E, PA-C   6 months ago Acute maxillary sinusitis, recurrence not specified   [place] [facility] Mecum, Oswaldo Conroy, PA-C   8 months ago Routine general medical examination at a health care facility   [facility] Dorcas Carrow, DO       Future Appointments             In 4 months Laural Benes, Oralia Rud, DO [place] [facility], [facility]

## 2022-07-09 ENCOUNTER — Other Ambulatory Visit: Payer: Self-pay | Admitting: Family Medicine

## 2022-07-09 NOTE — Telephone Encounter (Signed)
Rx was dc'd and sent another medication to pharmacy since rx was not covered by pt insurance.   Requested Prescriptions  Pending Prescriptions Disp Refills   budesonide-formoterol (SYMBICORT) 160-4.5 MCG/ACT inhaler [Pharmacy Med Name: BUDESONIDE/FORM 160/4.5MCG(120 INH)] 30.6 g     Sig: INHALE 2 PUFFS INTO THE LUNGS TWICE DAILY     Pulmonology:  Combination Products Passed - 07/08/2022  7:25 PM      Passed - Valid encounter within last 12 months    Recent Outpatient Visits           3 weeks ago Migraine without aura and with status migrainosus, not intractable   [place] [facility] [place], Megan P, DO   1 month ago Migraine without aura and with status migrainosus, not intractable   [place] [facility] [place], Megan P, DO   4 months ago Bronchitis with asthma, acute   [place] [facility] Mecum, Erin E, PA-C   6 months ago Acute maxillary sinusitis, recurrence not specified   [place] [facility] Mecum, Oswaldo Conroy, PA-C   8 months ago Routine general medical examination at a health care facility   [facility] Dorcas Carrow, DO       Future Appointments             In 4 months Laural Benes, Oralia Rud, DO [place] [facility], [facility]

## 2022-07-09 NOTE — Telephone Encounter (Signed)
Medication Refill - Medication: budesonide-formoterol (SYMBICORT) 160-4.5 MCG/ACT inhaler   Has the patient contacted their pharmacy? Yes.   Pt stated pharmacy was supposed to send this request since Sunday. Pt stated he is out, and he really needs his inhaler.   Please advise.  (Agent: If yes, when and what did the pharmacy advise?)  Preferred Pharmacy (with phone number or street name):  [facility][id number] [place], [place] - [address] AT [facility] OF [address] [place]  [address] [place] [address]  Phone: [phone] Fax: [phone]  Hours: Not open 24 hours   Has the patient been seen for an appointment in the last year OR does the patient have an upcoming appointment? Yes.    Agent: Please be advised that RX refills may take up to 3 business days. We ask that you follow-up with your pharmacy.

## 2022-07-10 NOTE — Telephone Encounter (Signed)
Requested medications are due for refill today.  Unsure  Requested medications are on the active medications list.  no  Last refill. 10/29/2021 10.2 2 rf  Future visit scheduled.   yes  Notes to clinic.  Med not on med list. Pt is requesting this medication. Please review for refill.    Requested Prescriptions  Pending Prescriptions Disp Refills   budesonide-formoterol (SYMBICORT) 160-4.5 MCG/ACT inhaler 10.2 g 2    Sig: Inhale 2 puffs into the lungs 2 (two) times daily.     Pulmonology:  Combination Products Passed - 07/09/2022  1:31 PM      Passed - Valid encounter within last 12 months    Recent Outpatient Visits           3 weeks ago Migraine without aura and with status migrainosus, not intractable   [place] [facility] [place], Megan P, DO   1 month ago Migraine without aura and with status migrainosus, not intractable   [place] [facility] [place], Megan P, DO   4 months ago Bronchitis with asthma, acute   [place] [facility] Mecum, Erin E, PA-C   6 months ago Acute maxillary sinusitis, recurrence not specified   [place] [facility] Mecum, Oswaldo Conroy, PA-C   8 months ago Routine general medical examination at a health care facility   [facility] Dorcas Carrow, DO       Future Appointments             In 4 months Laural Benes, Oralia Rud, DO [place] [facility], [facility]

## 2022-07-16 ENCOUNTER — Other Ambulatory Visit: Payer: Self-pay | Admitting: Family Medicine

## 2022-07-16 NOTE — Telephone Encounter (Signed)
Requested Prescriptions  Pending Prescriptions Disp Refills   nortriptyline (PAMELOR) 10 MG capsule [Pharmacy Med Name: NORTRIPTYLINE  CAPSULES] 90 capsule 0    Sig: TAKE 1 CAPSULE(10 MG) BY MOUTH AT BEDTIME     Psychiatry:  Antidepressants - Heterocyclics (TCAs) Passed - 07/16/2022  7:55 AM      Passed - Valid encounter within last 6 months    Recent Outpatient Visits           1 month ago Migraine without aura and with status migrainosus, not intractable   [place] [facility], Megan P, DO   2 months ago Migraine without aura and with status migrainosus, not intractable   [place] [facility] [place], Megan P, DO   4 months ago Bronchitis with asthma, acute   [place] [facility] Mecum, Erin E, PA-C   6 months ago Acute maxillary sinusitis, recurrence not specified   [place] [facility] Mecum, Oswaldo Conroy, PA-C   8 months ago Routine general medical examination at a health care facility   [facility] Dorcas Carrow, DO       Future Appointments             In 4 months Laural Benes, Oralia Rud, DO [facility] [facility], [facility]

## 2022-10-16 ENCOUNTER — Other Ambulatory Visit: Payer: Self-pay | Admitting: Family Medicine

## 2022-10-16 NOTE — Telephone Encounter (Signed)
Requested by interface surescripts. Future visit in 1 month.  Requested Prescriptions  Pending Prescriptions Disp Refills   nortriptyline (PAMELOR) 10 MG capsule [Pharmacy Med Name: NORTRIPTYLINE 10MG  CAPSULES] 90 capsule 0    Sig: TAKE 1 CAPSULE(10 MG) BY MOUTH AT BEDTIME     Psychiatry:  Antidepressants - Heterocyclics (TCAs) Passed - 10/16/2022  8:02 AM      Passed - Valid encounter within last 6 months    Recent Outpatient Visits           4 months ago Migraine without aura and with status migrainosus, not intractable   [place] [facility] [place], Megan P, DO   5 months ago Migraine without aura and with status migrainosus, not intractable   [place] [facility] [place], Megan P, DO   8 months ago Bronchitis with asthma, acute   [place] [facility] Mecum, Erin E, PA-C   9 months ago Acute maxillary sinusitis, recurrence not specified   [place] [facility] Mecum, Oswaldo Conroy, PA-C   11 months ago Routine general medical examination at a health care facility   [facility] Dorcas Carrow, DO       Future Appointments             In 1 month Laural Benes, Oralia Rud, DO [place] [facility], [facility]

## 2022-10-18 ENCOUNTER — Other Ambulatory Visit: Payer: Self-pay | Admitting: Family Medicine

## 2022-10-19 ENCOUNTER — Other Ambulatory Visit: Payer: Self-pay | Admitting: Family Medicine

## 2022-10-19 NOTE — Telephone Encounter (Signed)
Medication Refill - Medication: nortriptyline (PAMELOR) 10 MG capsule   Medication has been sent to the pharmacy on 10/16/2022 and showing confirmed receipt. Pharmacy is telling pt they never received it.   Pt is out of medication.    Has the patient contacted their pharmacy? Yes.   (Agent: If no, request that the patient contact the pharmacy for the refill. If patient does not wish to contact the pharmacy document the reason why and proceed with request.) (Agent: If yes, when and what did the pharmacy advise?)  Preferred Pharmacy (with phone number or street name):  [id number] [place], [place] - [address] AT [facility] OF [address] [place] [address] [place], [place] [address] Phone: [phone]  Fax: [phone]  Has the patient been seen for an appointment in the last year OR does the patient have an upcoming appointment? Yes.    Agent: Please be advised that RX refills may take up to 3 business days. We ask that you follow-up with your pharmacy.

## 2022-10-20 NOTE — Telephone Encounter (Signed)
Unable to refill per protocol, Rx request is too soon. Last refill 10/16/22 for 90 days.E-Prescribing Status: Receipt confirmed by pharmacy (10/16/2022 12:15 PM EDT).  Requested Prescriptions  Pending Prescriptions Disp Refills   nortriptyline (PAMELOR) 10 MG capsule [Pharmacy Med Name: NORTRIPTYLINE 10MG  CAPSULES] 90 capsule 0    Sig: TAKE 1 CAPSULE(10 MG) BY MOUTH AT BEDTIME     Psychiatry:  Antidepressants - Heterocyclics (TCAs) Passed - 10/18/2022  3:41 PM      Passed - Valid encounter within last 6 months    Recent Outpatient Visits           4 months ago Migraine without aura and with status migrainosus, not intractable   [place] [facility] [place], Megan P, DO   5 months ago Migraine without aura and with status migrainosus, not intractable   [place] [facility], Megan P, DO   8 months ago Bronchitis with asthma, acute   [place] [facility] Mec[facility], Erin E, PA-C   10 months ago Acute maxillary sinusitis, recurrence not specified   [place] [facility] Mec[facility], Oswaldo Conroy, PA-C   11 months ago Routine general medical examination at a health care facility   [facility] Dorcas Carrow, DO       Future Appointments             In 3 weeks Dorcas Carrow, DO [place], [facility]

## 2022-10-24 ENCOUNTER — Telehealth: Payer: BC Managed Care – PPO | Admitting: Family Medicine

## 2022-10-24 DIAGNOSIS — U071 COVID-19: Secondary | ICD-10-CM

## 2022-10-24 MED ORDER — NIRMATRELVIR/RITONAVIR (PAXLOVID)TABLET: 3.0000 | ORAL_TABLET | Freq: Two times a day (BID) | ORAL | 0 refills | Status: AC

## 2022-10-24 NOTE — Patient Instructions (Signed)

## 2022-10-24 NOTE — Progress Notes (Signed)
   Thank you for the details you included in the comment boxes. Those details are very helpful in determining the best course of treatment for you and help [place] to provide the best care.Because Mr. Christopher Davenport, we recommend that you convert this visit to a video visit in order for the provider to better assess what is going on.  The provider will be able to give you a more accurate diagnosis and treatment plan if we can more freely discuss your symptoms and with the addition of a virtual examination.   If you convert to a video visit, we will bill your insurance (similar to an office visit) and you will not be charged for this e-Visit. You will be able to stay at home and speak with the first available [facility] advanced practice provider. The link to do a video visit is in the drop down Menu tab of your Welcome screen in MyChart.

## 2022-10-24 NOTE — Progress Notes (Signed)
Virtual Visit Consent   Christopher Davenport, you are scheduled for a virtual visit with a [facility] provider today. Just as with appointments in the office, your consent must be obtained to participate. Your consent will be active for this visit and any virtual visit you may have with one of our providers in the next 365 days. If you have a MyChart account, a copy of this consent can be sent to you electronically.  As this is a virtual visit, video technology does not allow for your provider to perform a traditional examination. This may limit your provider's ability to fully assess your condition. If your provider identifies any concerns that need to be evaluated in person or the need to arrange testing (such as labs, EKG, etc.), we will make arrangements to do so. Although advances in technology are sophisticated, we cannot ensure that it will always work on either your end or our end. If the connection with a video visit is poor, the visit may have to be switched to a telephone visit. With either a video or telephone visit, we are not always able to ensure that we have a secure connection.  By engaging in this virtual visit, you consent to the provision of healthcare and authorize for your insurance to be billed (if applicable) for the services provided during this visit. Depending on your insurance coverage, you may receive a charge related to this service.  I need to obtain your verbal consent now. Are you willing to proceed with your visit today? Christopher Davenport has provided verbal consent on 10/24/2022 for a virtual visit (video or telephone). Georgana Curio, FNP  Date: 10/24/2022 1:24 PM  Virtual Visit via Video Note   I, Georgana Curio, connected with  Christopher Davenport  ([id number], 03-23-1976) on 10/24/22 at  2:00 PM EDT by a video-enabled telemedicine application and verified that I am speaking with the correct person using two identifiers.  Location: Patient: Virtual Visit Location Patient:  Home Provider: Virtual Visit Location Provider: Home Office   I discussed the limitations of evaluation and management by telemedicine and the availability of in person appointments. The patient expressed understanding and agreed to proceed.    History of Present Illness: Christopher Davenport is a 47 y.o. who identifies as a male who was assigned male at birth, and is being seen today for cough, congestion, body aches, chills, headace worsening with positive at home covid test. Sx for 3-4 days. He has tken paxlovid in the past without problems. Marland Kitchen  HPI: HPI  Problems:  Patient Active Problem List   Diagnosis Date Noted   Erectile dysfunction 06/15/2022   Migraine without aura and with status migrainosus, not intractable 05/15/2022   Adenomatous polyp of colon 03/19/2022   Epididymal cyst 08/19/2016   Asthma 11/16/2014   Anxiety disorder 11/16/2014   Other headache syndrome 03/27/2014    Allergies:  Allergies  Allergen Reactions   Anoro Ellipta [Umeclidinium-Vilanterol] Diarrhea and Other (See Comments)    "achy all over"   Medications:  Current Outpatient Medications:    nirmatrelvir/ritonavir (PAXLOVID) 20 x 150 MG & 10 x 100MG  TABS, Take 3 tablets by mouth 2 (two) times daily for 5 days. (Take nirmatrelvir 150 mg two tablets twice daily for 5 days and ritonavir 100 mg one tablet twice daily for 5 days) Patient GFR is 71, Disp: 30 tablet, Rfl: 0   albuterol (PROAIR HFA) 108 (90 Base) MCG/ACT inhaler, inhale 2 puffs by mouth every 4 to 6  hours if needed for SHORTNESS OF BREATH, Disp: 18 g, Rfl: 6   b complex vitamins tablet, Take 1 tablet by mouth daily., Disp: , Rfl:    busPIRone (BUSPAR) 10 MG tablet, TAKE 1 TO 1 1/2 TABLETS(10 TO 15 MG) BY MOUTH THREE TIMES DAILY, Disp: 135 tablet, Rfl: 1   CREATINE MONOHYDRATE PO, Take by mouth., Disp: , Rfl:    cyclobenzaprine (FLEXERIL) 10 MG tablet, Take 1 tablet (10 mg total) by mouth at bedtime., Disp: 30 tablet, Rfl: 0   FLUoxetine (PROZAC) 20  MG capsule, Take 1 capsule (20 mg total) by mouth daily., Disp: 90 capsule, Rfl: 1   fluticasone-salmeterol (WIXELA INHUB) 100-50 MCG/ACT AEPB, Inhale 1 puff into the lungs 2 (two) times daily., Disp: 60 each, Rfl: 6   Multiple Vitamin (MULTIVITAMIN WITH MINERALS) TABS tablet, Take 1 tablet by mouth daily., Disp: , Rfl:    nortriptyline (PAMELOR) 10 MG capsule, TAKE 1 CAPSULE(10 MG) BY MOUTH AT BEDTIME, Disp: 90 capsule, Rfl: 0   tadalafil (CIALIS) 20 MG tablet, Take 0.5-1 tablets (10-20 mg total) by mouth every other day as needed for erectile dysfunction., Disp: 10 tablet, Rfl: 11   vitamin C (ASCORBIC ACID) 500 MG tablet, Take 500 mg by mouth daily., Disp: , Rfl:   Observations/Objective: Patient is well-developed, well-nourished in no acute distress.  Resting comfortably  at home.  Head is normocephalic, atraumatic.  No labored breathing.  Speech is clear and coherent with logical content.  Patient is alert and oriented at baseline.    Assessment and Plan: 1. COVID-19  Increase fluids, humidifier at night, tylenol or ibuprofen, extra vit d and zinc, uc if sx worsen.   Follow Up Instructions: I discussed the assessment and treatment plan with the patient. The patient was provided an opportunity to ask questions and all were answered. The patient agreed with the plan and demonstrated an understanding of the instructions.  A copy of instructions were sent to the patient via MyChart unless otherwise noted below.     The patient was advised to call back or seek an in-person evaluation if the symptoms worsen or if the condition fails to improve as anticipated.  Time:  I spent 10 minutes with the patient via telehealth technology discussing the above problems/concerns.    Georgana Curio, FNP

## 2022-11-05 ENCOUNTER — Other Ambulatory Visit: Payer: Self-pay | Admitting: Family Medicine

## 2022-11-05 MED ORDER — NORTRIPTYLINE HCL 10 MG PO CAPS: 10.0000 mg | ORAL_CAPSULE | Freq: Every day | ORAL | 0 refills | Status: DC

## 2022-11-05 NOTE — Telephone Encounter (Signed)
Pt is requesting a call back, pt wants to speak to someone in the clinic about this

## 2022-11-05 NOTE — Telephone Encounter (Signed)
[facility] called and spoke to [employer] consultant about the refill(s) nortriptyline requested. Advised it was sent on 10/16/22 #90/0 refill(s). He says they did not receive it. Advised it will be sent in today.

## 2022-11-05 NOTE — Telephone Encounter (Addendum)
Patient called, left VM to return the call to the office if additional questions or the pharmacy, medication was sent in.    Requested Prescriptions  Pending Prescriptions Disp Refills   nortriptyline (PAMELOR) 10 MG capsule 90 capsule 0    Sig: Take 1 capsule (10 mg total) by mouth at bedtime.     Psychiatry:  Antidepressants - Heterocyclics (TCAs) Passed - 11/05/2022 11:17 AM      Passed - Valid encounter within last 6 months    Recent Outpatient Visits           4 months ago Migraine without aura and with status migrainosus, not intractable   [place] [place], Megan P, DO   5 months ago Migraine without aura and with status migrainosus, not intractable   [place] [facility] [place], Megan P, DO   8 months ago Bronchitis with asthma, acute   [place] [facility] Mecum, Erin E, PA-C   10 months ago Acute maxillary sinusitis, recurrence not specified   [place] [facility] Mecum, Oswaldo Conroy, PA-C   1 year ago Routine general medical examination at a health care facility   [facility] Dorcas Carrow, DO       Future Appointments             In 1 week Dorcas Carrow, DO  [facility]

## 2022-11-05 NOTE — Telephone Encounter (Signed)
Medication Refill - Medication: nortriptyline (PAMELOR) 10 MG capsule   Patient said that they told him they do not have the most recent prescription from the last week of July nor any refills. They have asked him to contact his PCP and request a new Rx. Please advise  Pt is completely out of his current supply, and has been for over a week.    Has the patient contacted their pharmacy? Yes.   (Agent: If no, request that the patient contact the pharmacy for the refill. If patient does not wish to contact the pharmacy document the reason why and proceed with request.) (Agent: If yes, when and what did the pharmacy advise?)  Preferred Pharmacy (with phone number or street name):  [id number] [place], [place] - [address] AT [facility] [address] [facility]  [address] [place] [address]  Phone: [phone] Fax: [phone]   Has the patient been seen for an appointment in the last year OR does the patient have an upcoming appointment? Yes.    Agent: Please be advised that RX refills may take up to 3 business days. We ask that you follow-up with your pharmacy.

## 2022-11-16 ENCOUNTER — Ambulatory Visit (INDEPENDENT_AMBULATORY_CARE_PROVIDER_SITE_OTHER): Payer: BC Managed Care – PPO | Admitting: Family Medicine

## 2022-11-16 ENCOUNTER — Encounter: Payer: Self-pay | Admitting: Family Medicine

## 2022-11-16 VITALS — BP 119/82 | HR 72 | Ht 73.0 in | Wt 215.8 lb

## 2022-11-16 DIAGNOSIS — F411 Generalized anxiety disorder: Secondary | ICD-10-CM | POA: Diagnosis not present

## 2022-11-16 DIAGNOSIS — Z0000 Encounter for general adult medical examination without abnormal findings: Secondary | ICD-10-CM

## 2022-11-16 DIAGNOSIS — J4520 Mild intermittent asthma, uncomplicated: Secondary | ICD-10-CM | POA: Diagnosis not present

## 2022-11-16 DIAGNOSIS — G43001 Migraine without aura, not intractable, with status migrainosus: Secondary | ICD-10-CM | POA: Diagnosis not present

## 2022-11-16 LAB — URINALYSIS, ROUTINE W REFLEX MICROSCOPIC
Bilirubin, UA: NEGATIVE
Glucose, UA: NEGATIVE
Ketones, UA: NEGATIVE
Leukocytes,UA: NEGATIVE
Nitrite, UA: NEGATIVE
Protein,UA: NEGATIVE
RBC, UA: NEGATIVE
Specific Gravity, UA: <1.005 — ABNORMAL LOW (ref 1.005–1.030)
Urobilinogen, Ur: 0.2 mg/dL (ref 0.2–1.0)
pH, UA: 6 (ref 5.0–7.5)

## 2022-11-16 MED ORDER — ALBUTEROL SULFATE HFA 108 (90 BASE) MCG/ACT IN AERS: INHALATION_SPRAY | RESPIRATORY_TRACT | 6 refills | Status: DC

## 2022-11-16 MED ORDER — FLUTICASONE-SALMETEROL 100-50 MCG/ACT IN AEPB: 1.0000 | INHALATION_SPRAY | Freq: Two times a day (BID) | RESPIRATORY_TRACT | 6 refills | Status: DC

## 2022-11-16 MED ORDER — BUSPIRONE HCL 10 MG PO TABS: ORAL_TABLET | ORAL | 1 refills | Status: DC

## 2022-11-16 MED ORDER — NORTRIPTYLINE HCL 10 MG PO CAPS: 10.0000 mg | ORAL_CAPSULE | Freq: Every day | ORAL | 1 refills | Status: DC

## 2022-11-16 MED ORDER — FLUOXETINE HCL 20 MG PO CAPS: 20.0000 mg | ORAL_CAPSULE | Freq: Every day | ORAL | 1 refills | Status: DC

## 2022-11-16 NOTE — Assessment & Plan Note (Signed)
Under fair control on current regimen- does not want to change anything. Continue current regimen. Continue to monitor. Call with any concerns. Refills given.

## 2022-11-16 NOTE — Assessment & Plan Note (Signed)
In exacerbation due to COVID a few weeks ago. Will use airsupra for the next couple of weeks. Call with any concerns.

## 2022-11-16 NOTE — Assessment & Plan Note (Signed)
Under good control on current regimen. Continue current regimen. Continue to monitor. Call with any concerns. Refills given.   

## 2022-11-16 NOTE — Progress Notes (Signed)
BP 119/82   Pulse 72   Ht 6\' 1"  (1.854 m)   Wt 215 lb 12.8 oz (97.9 kg)   SpO2 99%   BMI 28.47 kg/m    Subjective:    Patient ID: Christopher Davenport, male    DOB: 24-Sep-1975, 47 y.o.   MRN: [id number]  HPI: Christopher Davenport is a 47 y.o. male presenting on 11/16/2022 for comprehensive medical examination. Current medical complaints include:  ASTHMA Asthma status: exacerbated Satisfied with current treatment?: no Albuterol/rescue inhaler frequency: 2-3x a day Dyspnea frequency: 1-2x a day Wheezing frequency: occasionally Cough frequency: a little bit Nocturnal symptom frequency: none  Limitation of activity: no Current upper respiratory symptoms: no Aerochamber/spacer use: no Visits to ER or Urgent Care in past year: no Pneumovax: Not up to Date Influenza: Not up to Date  ANXIETY/DEPRESSION Duration: chronic Status:stable Anxious mood: yes  Excessive worrying: yes Irritability: yes  Sweating: no Nausea: no Palpitations:no Hyperventilation: no Panic attacks: no Agoraphobia: no  Obscessions/compulsions: no Depressed mood: yes    11/16/2022    9:26 AM 06/15/2022    9:17 AM 05/15/2022    9:41 AM 12/22/2021    2:20 PM 11/05/2021    9:50 AM  Depression screen PHQ 2/9  Decreased Interest 1 0 2 0 1  Down, Depressed, Hopeless 1 0 0 0 1  PHQ - 2 Score 2 0 2 0 2  Altered sleeping 3 0 0 2 2  Tired, decreased energy 2 2 3 2 2   Change in appetite 1 0 0 0 1  Feeling bad or failure about yourself  0 0 0 0 0  Trouble concentrating 2 1 3 2 2   Moving slowly or fidgety/restless 1 0 1 1 2   Suicidal thoughts 0 0 0 0 0  PHQ-9 Score 11 3 9 7 11   Difficult doing work/chores Somewhat difficult  Very difficult Somewhat difficult Somewhat difficult      11/16/2022    9:26 AM 06/15/2022    9:17 AM 05/15/2022    9:41 AM 12/22/2021    2:21 PM  GAD 7 : Generalized Anxiety Score  Nervous, Anxious, on Edge 2 1 1 2   Control/stop worrying 2 1 0 2  Worry too much - different things 2 1 0 2   Trouble relaxing 2 1 0 2  Restless 1 0 0 0  Easily annoyed or irritable 2 1 1 2   Afraid - awful might happen 0 0 1 0  Total GAD 7 Score 11 5 3 10   Anxiety Difficulty Somewhat difficult Somewhat difficult Very difficult Somewhat difficult   Anhedonia: no Weight changes: no Insomnia: yes hard to stay asleep  Hypersomnia: no Fatigue/loss of energy: yes Feelings of worthlessness: yes Feelings of guilt: yes Impaired concentration/indecisiveness: no Suicidal ideations: no  Crying spells: no Recent Stressors/Life Changes: yes   Relationship problems: no   Family stress: no     Financial stress: yes    Job stress: yes    Recent death/loss: no  Interim Problems from his last visit: no  Depression Screen done today and results listed below:     11/16/2022    9:26 AM 06/15/2022    9:17 AM 05/15/2022    9:41 AM 12/22/2021    2:20 PM 11/05/2021    9:50 AM  Depression screen PHQ 2/9  Decreased Interest 1 0 2 0 1  Down, Depressed, Hopeless 1 0 0 0 1  PHQ - 2 Score 2 0 2 0 2  Altered sleeping  3 0 0 2 2  Tired, decreased energy 2 2 3 2 2   Change in appetite 1 0 0 0 1  Feeling bad or failure about yourself  0 0 0 0 0  Trouble concentrating 2 1 3 2 2   Moving slowly or fidgety/restless 1 0 1 1 2   Suicidal thoughts 0 0 0 0 0  PHQ-9 Score 11 3 9 7 11   Difficult doing work/chores Somewhat difficult  Very difficult Somewhat difficult Somewhat difficult    Past Medical History:  Past Medical History:  Diagnosis Date   Anxiety    Asthma    GERD (gastroesophageal reflux disease)     Surgical History:  Past Surgical History:  Procedure Laterality Date   COLONOSCOPY WITH PROPOFOL N/A 03/19/2022   Procedure: COLONOSCOPY WITH PROPOFOL;  Surgeon: Wyline Mood, MD;  Location: [facility] END[facility]COPY;  Service: Gastroenterology;  Laterality: N/A;    Medications:  Current Outpatient Medications on File Prior to Visit  Medication Sig   b complex vitamins tablet Take 1 tablet by mouth daily.    CREATINE MONOHYDRATE PO Take by mouth.   cyclobenzaprine (FLEXERIL) 10 MG tablet Take 1 tablet (10 mg total) by mouth at bedtime.   Multiple Vitamin (MULTIVITAMIN WITH MINERALS) TABS tablet Take 1 tablet by mouth daily.   tadalafil (CIALIS) 20 MG tablet Take 0.5-1 tablets (10-20 mg total) by mouth every other day as needed for erectile dysfunction.   vitamin C (ASCORBIC ACID) 500 MG tablet Take 500 mg by mouth daily.   No current facility-administered medications on file prior to visit.    Allergies:  Allergies  Allergen Reactions   Anoro Ellipta [Umeclidinium-Vilanterol] Diarrhea and Other (See Comments)    "achy all over"    Social History:  Social History   Socioeconomic History   Marital status: Married    Spouse name: Not on file   Number of children: Not on file   Years of education: Not on file   Highest education level: Master's degree (e.g., MA, MS, MEng, MEd, MSW, MBA)  Occupational History   Not on file  Tobacco Use   Smoking status: Former   Smokeless tobacco: Current    Types: Chew  Vaping Use   Vaping status: Never Used  Substance and Sexual Activity   Alcohol use: Yes    Comment: on the weekends   Drug use: No   Sexual activity: Yes  Other Topics Concern   Not on file  Social History Narrative   Not on file   Social Determinants of Health   Financial Resource Strain: Low Risk  (06/12/2022)   Overall Financial Resource Strain (CARDIA)    Difficulty of Paying Living Expenses: Not hard at all  Food Insecurity: No Food Insecurity (06/12/2022)   Hunger Vital Sign    Worried About Running Out of Food in the Last Year: Never true    Ran Out of Food in the Last Year: Never true  Transportation Needs: No Transportation Needs (06/12/2022)   PRAPARE - Administrator, Civil Service (Medical): No    Lack of Transportation (Non-Medical): No  Physical Activity: Sufficiently Active (06/12/2022)   Exercise Vital Sign    Days of Exercise per Week: 3 days     Minutes of Exercise per Session: 60 min  Stress: Stress Concern Present (06/12/2022)   [employer] of Occupational Health - Occupational Stress Questionnaire    Feeling of Stress : To some extent  Social Connections: Moderately Isolated (06/12/2022)   Social  Connection and Isolation Panel [NHANES]    Frequency of Communication with Friends and Family: Once a week    Frequency of Social Gatherings with Friends and Family: Once a week    Attends Religious Services: 1 to 4 times per year    Active Member of [employer] or Organizations: No    Attends Engineer, structural: Not on file    Marital Status: Married  Catering manager Violence: Not on file   Social History   Tobacco Use  Smoking Status Former  Smokeless Tobacco Current   Types: Sports administrator   Social History   Substance and Sexual Activity  Alcohol Use Yes   Comment: on the weekends    Family History:  Family History  Problem Relation Age of Onset   Diabetes Mother    Kidney disease Mother    Heart disease Mother    Hypertension Mother    Hyperlipidemia Mother    Cancer Father        lung   Lung cancer Father    Dementia Maternal Grandmother    Diabetes Maternal Grandfather    Diabetes Paternal Grandmother     Past medical history, surgical history, medications, allergies, family history and social history reviewed with patient today and changes made to appropriate areas of the chart.   Review of Systems  Constitutional: Negative.   HENT: Negative.    Eyes: Negative.   Respiratory:  Positive for cough, shortness of breath and wheezing. Negative for hemoptysis and sputum production.   Cardiovascular: Negative.   Skin: Negative.   Neurological:  Positive for headaches. Negative for tingling.   All other ROS negative except what is listed above and in the HPI.      Objective:    BP 119/82   Pulse 72   Ht 6\' 1"  (1.854 m)   Wt 215 lb 12.8 oz (97.9 kg)   SpO2 99%   BMI 28.47 kg/m   Wt Readings  from Last 3 Encounters:  11/16/22 215 lb 12.8 oz (97.9 kg)  06/15/22 227 lb 12.8 oz (103.3 kg)  05/15/22 220 lb 3.2 oz (99.9 kg)    Physical Exam Vitals and nursing note reviewed.  Constitutional:      General: He is not in acute distress.    Appearance: Normal appearance. He is not ill-appearing, toxic-appearing or diaphoretic.  HENT:     Head: Normocephalic and atraumatic.     Right Ear: Tympanic membrane, ear canal and external ear normal. There is no impacted cerumen.     Left Ear: Tympanic membrane, ear canal and external ear normal. There is no impacted cerumen.     Nose: Nose normal. No congestion or rhinorrhea.     Mouth/Throat:     Mouth: Mucous membranes are moist.     Pharynx: Oropharynx is clear. No oropharyngeal exudate or posterior oropharyngeal erythema.  Eyes:     General: No scleral icterus.       Right eye: No discharge.        Left eye: No discharge.     Extraocular Movements: Extraocular movements intact.     Conjunctiva/sclera: Conjunctivae normal.     Pupils: Pupils are equal, round, and reactive to light.  Neck:     Vascular: No carotid bruit.  Cardiovascular:     Rate and Rhythm: Normal rate and regular rhythm.     Pulses: Normal pulses.     Heart sounds: No murmur heard.    No friction rub. No gallop.  Pulmonary:  Effort: Pulmonary effort is normal. No respiratory distress.     Breath sounds: Normal breath sounds. No stridor. No wheezing, rhonchi or rales.  Chest:     Chest wall: No tenderness.  Abdominal:     General: Abdomen is flat. Bowel sounds are normal. There is no distension.     Palpations: Abdomen is soft. There is no mass.     Tenderness: There is no abdominal tenderness. There is no right CVA tenderness, left CVA tenderness, guarding or rebound.     Hernia: No hernia is present.  Genitourinary:    Comments: Genital exam deferred with shared decision making Musculoskeletal:        General: No swelling, tenderness, deformity or signs  of injury.     Cervical back: Normal range of motion and neck supple. No rigidity. No muscular tenderness.     Right lower leg: No edema.     Left lower leg: No edema.  Lymphadenopathy:     Cervical: No cervical adenopathy.  Skin:    General: Skin is warm and dry.     Capillary Refill: Capillary refill takes less than 2 seconds.     Coloration: Skin is not jaundiced or pale.     Findings: No bruising, erythema, lesion or rash.  Neurological:     General: No focal deficit present.     Mental Status: He is alert and oriented to person, place, and time.     Cranial Nerves: No cranial nerve deficit.     Sensory: No sensory deficit.     Motor: No weakness.     Coordination: Coordination normal.     Gait: Gait normal.     Deep Tendon Reflexes: Reflexes normal.  Psychiatric:        Mood and Affect: Mood normal.        Behavior: Behavior normal.        Thought Content: Thought content normal.        Judgment: Judgment normal.     Results for orders placed or performed during the hospital encounter of 03/19/22  Surgical pathology  Result Value Ref Range   SURGICAL PATHOLOGY      SURGICAL PATHOLOGY CASE: [id number] PATIENT: Karna Christmas Surgical Pathology Report     Specimen Submitted: A. Colon polyp x3, cecum; cold snare B. Colon polyp x2, descending; cold snare C. Colon polyp x2, sigmoid; hot snare  Clinical History: Screening for colon cancer      DIAGNOSIS: A.  COLON POLYP X 3, CECUM; COLD SNARE: - TUBULAR ADENOMA (MULTIPLE FRAGMENTS). - SESSILE SERRATED POLYP (MULTIPLE FRAGMENTS). - NEGATIVE FOR HIGH-GRADE DYSPLASIA AND MALIGNANCY.  B.  COLON POLYP X 2, DESCENDING; COLD SNARE: - TUBULAR ADENOMA (MULTIPLE FRAGMENTS). - HYPERPLASTIC POLYP (1). - NEGATIVE FOR HIGH-GRADE DYSPLASIA AND MALIGNANCY.  C.  COLON POLYP X 2, SIGMOID; HOT SNARE: - TUBULAR ADENOMA (MULTIPLE FRAGMENTS). - NEGATIVE FOR HIGH-GRADE DYSPLASIA AND MALIGNANCY.  GROSS DESCRIPTION: A.  Labeled: Cold snared cecal polyp x 3 Received: Formalin Collection time: 8:14 AM on 03/19/2022 Placed into formalin time: 8:14 AM on 03/19/2022 Tissue fragment(s): Mult iple Size: Aggregate, 1.2 x 0.4 x 0.1 cm Description: Tan soft tissue fragments Entirely submitted in 1 cassette.  B. Labeled: Cold snared descending colon polyp x 2 Received: Formalin Collection time: 8:40 AM on 03/19/2022 Placed into formalin time: 8:40 AM on 03/19/2022 Tissue fragment(s): Multiple Size: Aggregate, 1.3 x 0.3 x 0.1 cm Description: Tan soft tissue fragments Entirely submitted in 1 cassette.  C. Labeled: Hot snared sigmoid  colon polyps x 2 Received: Formalin Collection time: 8:42 AM on 03/19/2022 Placed into formalin time: 8:42 AM on 03/19/2022 Tissue fragment(s): Multiple Size: Aggregate, 2.0 x 1.0 x 0.2 cm Description: Pink soft tissue fragments Entirely submitted in 1 cassette.  CM 03/19/2022  Final Diagnosis performed by Elijah Birk, MD.   Electronically signed 03/20/2022 8:54:30AM The electronic signature indicates that the named Attending Pathologist has evaluated the specimen Technical component performed at [facility], [address],  [place], [place] [address] Lab: [phone] Dir: Jolene Schimke, MD, MMM  Professional component performed at [facility], [address] [place], [place], [place] [address] Lab: [phone] Dir: Beryle Quant, MD       Assessment & Plan:   Problem List Items Addressed This Visit       Cardiovascular and Mediastinum   Migraine without aura and with status migrainosus, not intractable    Under good control on current regimen. Continue current regimen. Continue to monitor. Call with any concerns. Refills given.        Relevant Medications   FLUoxetine (PROZAC) 20 MG capsule   nortriptyline (PAMELOR) 10 MG capsule     Respiratory   Asthma    In exacerbation due to COVID a few weeks ago. Will use airsupra for the next  couple of weeks. Call with any concerns.       Relevant Medications   albuterol (PROAIR HFA) 108 (90 Base) MCG/ACT inhaler   fluticasone-salmeterol (WIXELA INHUB) 100-50 MCG/ACT AEPB     Other   Anxiety disorder    Under fair control on current regimen- does not want to change anything. Continue current regimen. Continue to monitor. Call with any concerns. Refills given.        Relevant Medications   busPIRone (BUSPAR) 10 MG tablet   FLUoxetine (PROZAC) 20 MG capsule   nortriptyline (PAMELOR) 10 MG capsule   Other Visit Diagnoses     Routine general medical examination at a health care facility    -  Primary   Vaccines up to date. Screening labs checked today. Colonoscopy up to date. Continue diet and exercise. Call with any concerns.   Relevant Orders   Comprehensive metabolic panel   CBC with Differential/Platelet   Lipid Panel w/o Chol/HDL Ratio   PSA   TSH   Urinalysis, Routine w reflex microscopic       LABORATORY TESTING:  Health maintenance labs ordered today as discussed above.   The natural history of prostate cancer and ongoing controversy regarding screening and potential treatment outcomes of prostate cancer has been discussed with the patient. The meaning of a false positive PSA and a false negative PSA has been discussed. He indicates understanding of the limitations of this screening test and wishes to proceed with screening PSA testing.   IMMUNIZATIONS:   - Tdap: Tetanus vaccination status reviewed: last tetanus booster within 10 years. - Influenza: Postponed to flu season - Pneumovax: Up to date - Prevnar: Not applicable - COVID: Up to date - HPV: Not applicable - Shingrix vaccine: Not applicable  SCREENING: - Colonoscopy: Up to date  Discussed with patient purpose of the colonoscopy is to detect colon cancer at curable precancerous or early stages   PATIENT COUNSELING:    Sexuality: Discussed sexually transmitted diseases, partner selection, use  of condoms, avoidance of unintended pregnancy  and contraceptive alternatives.   Advised to avoid cigarette smoking.  I discussed with the patient that most people either abstain from alcohol or drink within safe limits (<=  14/week and <=4 drinks/occasion for males, <=7/weeks and <= 3 drinks/occasion for females) and that the risk for alcohol disorders and other health effects rises proportionally with the number of drinks per week and how often a drinker exceeds daily limits.  Discussed cessation/primary prevention of drug use and availability of treatment for abuse.   Diet: Encouraged to adjust caloric intake to maintain  or achieve ideal body weight, to reduce intake of dietary saturated fat and total fat, to limit sodium intake by avoiding high sodium foods and not adding table salt, and to maintain adequate dietary potassium and calcium preferably from fresh fruits, vegetables, and low-fat dairy products.    stressed the importance of regular exercise  Injury prevention: Discussed safety belts, safety helmets, smoke detector, smoking near bedding or upholstery.   Dental health: Discussed importance of regular tooth brushing, flossing, and dental visits.   Follow up plan: NEXT PREVENTATIVE PHYSICAL DUE IN 1 YEAR. Return in about 6 months (around 05/19/2023).

## 2022-11-17 ENCOUNTER — Other Ambulatory Visit: Payer: Self-pay | Admitting: Family Medicine

## 2022-11-17 DIAGNOSIS — N289 Disorder of kidney and ureter, unspecified: Secondary | ICD-10-CM

## 2022-11-17 LAB — CBC WITH DIFFERENTIAL/PLATELET
Basophils Absolute: 0 10*3/uL (ref 0.0–0.2)
Basos: 1 %
EOS (ABSOLUTE): 0.6 10*3/uL — ABNORMAL HIGH (ref 0.0–0.4)
Eos: 11 %
Hematocrit: 39.7 % (ref 37.5–51.0)
Hemoglobin: 13.4 g/dL (ref 13.0–17.7)
Immature Grans (Abs): 0 10*3/uL (ref 0.0–0.1)
Immature Granulocytes: 0 %
Lymphocytes Absolute: 1.9 10*3/uL (ref 0.7–3.1)
Lymphs: 37 %
MCH: 32.4 pg (ref 26.6–33.0)
MCHC: 33.8 g/dL (ref 31.5–35.7)
MCV: 96 fL (ref 79–97)
Monocytes Absolute: 0.5 10*3/uL (ref 0.1–0.9)
Monocytes: 9 %
Neutrophils Absolute: 2.1 10*3/uL (ref 1.4–7.0)
Neutrophils: 42 %
Platelets: 219 10*3/uL (ref 150–450)
RBC: 4.13 x10E6/uL — ABNORMAL LOW (ref 4.14–5.80)
RDW: 13.4 % (ref 11.6–15.4)
WBC: 5.1 10*3/uL (ref 3.4–10.8)

## 2022-11-17 LAB — LIPID PANEL W/O CHOL/HDL RATIO
Cholesterol, Total: 209 mg/dL — ABNORMAL HIGH (ref 100–199)
HDL: 54 mg/dL (ref >39)
LDL Chol Calc (NIH): 136 mg/dL — ABNORMAL HIGH (ref 0–99)
Triglycerides: 104 mg/dL (ref 0–149)
VLDL Cholesterol Cal: 19 mg/dL (ref 5–40)

## 2022-11-17 LAB — COMPREHENSIVE METABOLIC PANEL
ALT: 16 IU/L (ref 0–44)
AST: 23 IU/L (ref 0–40)
Albumin: 4.4 g/dL (ref 4.1–5.1)
Alkaline Phosphatase: 74 IU/L (ref 44–121)
BUN/Creatinine Ratio: 14 (ref 9–20)
BUN: 18 mg/dL (ref 6–24)
Bilirubin Total: 0.6 mg/dL (ref 0.0–1.2)
CO2: 24 mmol/L (ref 20–29)
Calcium: 9.7 mg/dL (ref 8.7–10.2)
Chloride: 103 mmol/L (ref 96–106)
Creatinine, Ser: 1.3 mg/dL — ABNORMAL HIGH (ref 0.76–1.27)
Globulin, Total: 2.4 g/dL (ref 1.5–4.5)
Glucose: 98 mg/dL (ref 70–99)
Potassium: 4.6 mmol/L (ref 3.5–5.2)
Sodium: 142 mmol/L (ref 134–144)
Total Protein: 6.8 g/dL (ref 6.0–8.5)
eGFR: 67 mL/min/{1.73_m2} (ref >59)

## 2022-11-17 LAB — TSH: TSH: 3.84 u[IU]/mL (ref 0.450–4.500)

## 2022-11-17 LAB — PSA: Prostate Specific Ag, Serum: 0.5 ng/mL (ref 0.0–4.0)

## 2022-11-18 NOTE — Progress Notes (Signed)
Called and scheduled patient on 12/02/2022 @ 8:40 am.

## 2022-12-02 ENCOUNTER — Other Ambulatory Visit (INDEPENDENT_AMBULATORY_CARE_PROVIDER_SITE_OTHER): Payer: BC Managed Care – PPO

## 2022-12-02 DIAGNOSIS — N289 Disorder of kidney and ureter, unspecified: Secondary | ICD-10-CM

## 2022-12-03 LAB — BASIC METABOLIC PANEL
BUN/Creatinine Ratio: 14 (ref 9–20)
BUN: 19 mg/dL (ref 6–24)
CO2: 24 mmol/L (ref 20–29)
Calcium: 9 mg/dL (ref 8.7–10.2)
Chloride: 99 mmol/L (ref 96–106)
Creatinine, Ser: 1.32 mg/dL — ABNORMAL HIGH (ref 0.76–1.27)
Glucose: 90 mg/dL (ref 70–99)
Potassium: 3.7 mmol/L (ref 3.5–5.2)
Sodium: 137 mmol/L (ref 134–144)
eGFR: 66 mL/min/{1.73_m2} (ref >59)

## 2022-12-11 ENCOUNTER — Telehealth: Payer: Self-pay | Admitting: Family Medicine

## 2022-12-11 NOTE — Telephone Encounter (Signed)
Patient called and requested a script of Airsupra as he was given samples and says it works great for him. Please f/u with patient

## 2022-12-11 NOTE — Telephone Encounter (Signed)
Patient request a prescription for Airsupra , previously was given samples of medication.

## 2022-12-13 MED ORDER — AIRSUPRA 90-80 MCG/ACT IN AERO: 1.0000 | INHALATION_SPRAY | RESPIRATORY_TRACT | 12 refills | Status: AC | PRN

## 2022-12-23 ENCOUNTER — Encounter: Payer: Self-pay | Admitting: Family Medicine

## 2022-12-23 ENCOUNTER — Ambulatory Visit (INDEPENDENT_AMBULATORY_CARE_PROVIDER_SITE_OTHER): Payer: BC Managed Care – PPO | Admitting: Family Medicine

## 2022-12-23 VITALS — BP 103/72 | HR 75 | Ht 73.0 in | Wt 216.2 lb

## 2022-12-23 DIAGNOSIS — L089 Local infection of the skin and subcutaneous tissue, unspecified: Secondary | ICD-10-CM | POA: Diagnosis not present

## 2022-12-23 DIAGNOSIS — Z23 Encounter for immunization: Secondary | ICD-10-CM

## 2022-12-23 MED ORDER — TRIAMCINOLONE ACETONIDE 0.5 % EX OINT: 1.0000 | TOPICAL_OINTMENT | Freq: Two times a day (BID) | CUTANEOUS | 0 refills | Status: AC

## 2022-12-23 MED ORDER — SULFAMETHOXAZOLE-TRIMETHOPRIM 800-160 MG PO TABS: 1.0000 | ORAL_TABLET | Freq: Two times a day (BID) | ORAL | 0 refills | Status: DC

## 2022-12-23 NOTE — Progress Notes (Signed)
BP 103/72   Pulse 75   Ht 6\' 1"  (1.854 m)   Wt 216 lb 3.2 oz (98.1 kg)   SpO2 100%   BMI 28.52 kg/m    Subjective:    Patient ID: Christopher Davenport, male    DOB: [birth year], 47 y.o.   MRN: [id number]  HPI: Christopher Davenport is a 47 y.o. male  Chief Complaint  Patient presents with   Skin Problem    Patient says he first noticed the area on his R ankle about a month ago, and assumed it was from a bite,  but he noticed it is not completely healed. Patient says the area did itch at first, but now it is not itch nor is there any pain in the area unless he applies pressure to the area. Patient denies trying any medication on the area.    SKIN LESION Duration: about a month Location: R ankle Painful: no Itching: no Onset: sudden Context: bigger History of skin cancer: no History of precancerous skin lesions: no Family history of skin cancer: no  Relevant past medical, surgical, family and social history reviewed and updated as indicated. Interim medical history since our last visit reviewed. Allergies and medications reviewed and updated.  Review of Systems  Constitutional: Negative.   Respiratory: Negative.    Cardiovascular: Negative.   Musculoskeletal: Negative.   Skin:  Positive for wound. Negative for color change, pallor and rash.  Psychiatric/Behavioral: Negative.      Per HPI unless specifically indicated above     Objective:    BP 103/72   Pulse 75   Ht 6\' 1"  (1.854 m)   Wt 216 lb 3.2 oz (98.1 kg)   SpO2 100%   BMI 28.52 kg/m   Wt Readings from Last 3 Encounters:  12/23/22 216 lb 3.2 oz (98.1 kg)  11/16/22 215 lb 12.8 oz (97.9 kg)  06/15/22 227 lb 12.8 oz (103.3 kg)    Physical Exam Vitals and nursing note reviewed.  Constitutional:      General: He is not in acute distress.    Appearance: Normal appearance. He is not ill-appearing, toxic-appearing or diaphoretic.  HENT:     Head: Normocephalic and atraumatic.     Right Ear: External ear normal.      Left Ear: External ear normal.     Nose: Nose normal.     Mouth/Throat:     Mouth: Mucous membranes are moist.     Pharynx: Oropharynx is clear.  Eyes:     General: No scleral icterus.       Right eye: No discharge.        Left eye: No discharge.     Extraocular Movements: Extraocular movements intact.     Conjunctiva/sclera: Conjunctivae normal.     Pupils: Pupils are equal, round, and reactive to light.  Cardiovascular:     Rate and Rhythm: Normal rate and regular rhythm.     Pulses: Normal pulses.     Heart sounds: Normal heart sounds. No murmur heard.    No friction rub. No gallop.  Pulmonary:     Effort: Pulmonary effort is normal. No respiratory distress.     Breath sounds: Normal breath sounds. No stridor. No wheezing, rhonchi or rales.  Chest:     Chest wall: No tenderness.  Musculoskeletal:        General: Normal range of motion.     Cervical back: Normal range of motion and neck supple.  Skin:  General: Skin is warm and dry.     Capillary Refill: Capillary refill takes less than 2 seconds.     Coloration: Skin is not jaundiced or pale.     Findings: No bruising, erythema, lesion or rash.     Comments: 1 inch red, irritated non-fluctuant lesion on R ankle with slightly darker, red area in center  Neurological:     General: No focal deficit present.     Mental Status: He is alert and oriented to person, place, and time. Mental status is at baseline.  Psychiatric:        Mood and Affect: Mood normal.        Behavior: Behavior normal.        Thought Content: Thought content normal.        Judgment: Judgment normal.     Results for orders placed or performed in visit on 12/02/22  Basic metabolic panel  Result Value Ref Range   Glucose 90 70 - 99 mg/dL   BUN 19 6 - 24 mg/dL   Creatinine, Ser 1.61 (H) 0.76 - 1.27 mg/dL   eGFR 66 >09 UE/AVW/0.98   BUN/Creatinine Ratio 14 9 - 20   Sodium 137 134 - 144 mmol/L   Potassium 3.7 3.5 - 5.2 mmol/L   Chloride 99 96  - 106 mmol/L   CO2 24 20 - 29 mmol/L   Calcium 9.0 8.7 - 10.2 mg/dL      Assessment & Plan:   Problem List Items Addressed This Visit   None Visit Diagnoses     Skin infection    -  Primary   Will treat with bactrim and triamcinalone ointment. If not better in 1 week, consider punch biopsy.   Relevant Medications   sulfamethoxazole-trimethoprim (BACTRIM DS) 800-160 MG tablet   Needs flu shot       Flu shot given today.   Relevant Orders   Flu vaccine trivalent PF, 6mos and older(Flulaval,Afluria,Fluarix,Fluzone)        Follow up plan: No follow-ups on file.

## 2022-12-26 ENCOUNTER — Other Ambulatory Visit: Payer: Self-pay | Admitting: Family Medicine

## 2022-12-28 NOTE — Telephone Encounter (Signed)
[facility] [phone] called and spoke to [employer] about the refill(s) Fluoxetine 20mg  requested. Advised it was sent on 11/16/22 #90/0 refill(s). She states that it was received and they had an old rx on file so she would go ahead and deny that request for today.   Requested Prescriptions  Pending Prescriptions Disp Refills   FLUoxetine (PROZAC) 20 MG capsule [Pharmacy Med Name: FLUOXETINE 20MG  CAPSULES] 90 capsule 1    Sig: TAKE 1 CAPSULE(20 MG) BY MOUTH DAILY     Psychiatry:  Antidepressants - SSRI Passed - 12/26/2022  9:15 AM      Passed - Valid encounter within last 6 months    Recent Outpatient Visits           5 days ago Skin infection   [place] [facility], [place] P, DO   1 month ago Routine general medical examination at a health care facility   [facility] [place] P, DO   6 months ago Migraine without aura and with status migrainosus, not intractable   [place] [facility] [place], Megan P, DO   7 months ago Migraine without aura and with status migrainosus, not intractable   [place] [facility] [place], Megan P, DO   10 months ago Bronchitis with asthma, acute   [place] [facility] Mecum, Oswaldo Conroy, PA-C       Future Appointments             In 4 months Laural Benes, Oralia Rud, DO [place] [facility]

## 2023-01-29 ENCOUNTER — Telehealth: Payer: BC Managed Care – PPO | Admitting: Physician Assistant

## 2023-01-29 ENCOUNTER — Ambulatory Visit: Payer: Self-pay | Admitting: *Deleted

## 2023-01-29 DIAGNOSIS — J019 Acute sinusitis, unspecified: Secondary | ICD-10-CM | POA: Diagnosis not present

## 2023-01-29 DIAGNOSIS — B9689 Other specified bacterial agents as the cause of diseases classified elsewhere: Secondary | ICD-10-CM

## 2023-01-29 MED ORDER — AZITHROMYCIN 250 MG PO TABS
ORAL_TABLET | ORAL | 0 refills | Status: AC
Start: 2023-01-29 — End: 2023-02-03

## 2023-01-29 NOTE — Progress Notes (Signed)
Virtual Visit Consent   Christopher Davenport, you are scheduled for a virtual visit with a [facility] provider today. Just as with appointments in the office, your consent must be obtained to participate. Your consent will be active for this visit and any virtual visit you may have with one of our providers in the next 365 days. If you have a MyChart account, a copy of this consent can be sent to you electronically.  As this is a virtual visit, video technology does not allow for your provider to perform a traditional examination. This may limit your provider's ability to fully assess your condition. If your provider identifies any concerns that need to be evaluated in person or the need to arrange testing (such as labs, EKG, etc.), we will make arrangements to do so. Although advances in technology are sophisticated, we cannot ensure that it will always work on either your end or our end. If the connection with a video visit is poor, the visit may have to be switched to a telephone visit. With either a video or telephone visit, we are not always able to ensure that we have a secure connection.  By engaging in this virtual visit, you consent to the provision of healthcare and authorize for your insurance to be billed (if applicable) for the services provided during this visit. Depending on your insurance coverage, you may receive a charge related to this service.  I need to obtain your verbal consent now. Are you willing to proceed with your visit today? Christopher Davenport has provided verbal consent on 01/29/2023 for a virtual visit (video or telephone). Margaretann Loveless, PA-C  Date: 01/29/2023 5:54 PM  Virtual Visit via Video Note   I, Margaretann Loveless, connected with  Christopher Davenport  ([id number], November 30, 1975) on 01/29/23 at  6:00 PM EST by a video-enabled telemedicine application and verified that I am speaking with the correct person using two identifiers.  Location: Patient: Virtual Visit  Location Patient: Home Provider: Virtual Visit Location Provider: Home Office   I discussed the limitations of evaluation and management by telemedicine and the availability of in person appointments. The patient expressed understanding and agreed to proceed.    History of Present Illness: Christopher Davenport is a 47 y.o. who identifies as a male who was assigned male at birth, and is being seen today for possible sinus infection.  HPI: Sinusitis This is a new problem. The current episode started 1 to 4 weeks ago. The problem has been gradually worsening since onset. There has been no fever. Associated symptoms include congestion, coughing, ear pain (both), headaches, sinus pressure and a sore throat (from drainage). Pertinent negatives include no chills. (Post nasal drainage) Treatments tried: dayquil, nyquil. The treatment provided no relief.     Problems:  Patient Active Problem List   Diagnosis Date Noted   Erectile dysfunction 06/15/2022   Migraine without aura and with status migrainosus, not intractable 05/15/2022   Adenomatous polyp of colon 03/19/2022   Epididymal cyst 08/19/2016   Asthma 11/16/2014   Anxiety disorder 11/16/2014   Other headache syndrome 03/27/2014    Allergies:  Allergies  Allergen Reactions   Anoro Ellipta [Umeclidinium-Vilanterol] Diarrhea and Other (See Comments)    "achy all over"   Medications:  Current Outpatient Medications:    azithromycin (ZITHROMAX) 250 MG tablet, Take 2 tablets on day 1, then 1 tablet daily on days 2 through 5, Disp: 6 tablet, Rfl: 0   albuterol (PROAIR HFA) 108 (  90 Base) MCG/ACT inhaler, inhale 2 puffs by mouth every 4 to 6 hours if needed for SHORTNESS OF BREATH, Disp: 18 g, Rfl: 6   Albuterol-Budesonide (AIRSUPRA) 90-80 MCG/ACT AERO, Inhale 1-2 puffs into the lungs every 4 (four) hours as needed., Disp: 10.7 g, Rfl: 12   b complex vitamins tablet, Take 1 tablet by mouth daily., Disp: , Rfl:    busPIRone (BUSPAR) 10 MG tablet,  TAKE 1 TO 1 1/2 TABLETS(10 TO 15 MG) BY MOUTH THREE TIMES DAILY, Disp: 135 tablet, Rfl: 1   CREATINE MONOHYDRATE PO, Take by mouth., Disp: , Rfl:    cyclobenzaprine (FLEXERIL) 10 MG tablet, Take 1 tablet (10 mg total) by mouth at bedtime., Disp: 30 tablet, Rfl: 0   FLUoxetine (PROZAC) 20 MG capsule, Take 1 capsule (20 mg total) by mouth daily., Disp: 90 capsule, Rfl: 1   fluticasone-salmeterol (WIXELA INHUB) 100-50 MCG/ACT AEPB, Inhale 1 puff into the lungs 2 (two) times daily., Disp: 60 each, Rfl: 6   Multiple Vitamin (MULTIVITAMIN WITH MINERALS) TABS tablet, Take 1 tablet by mouth daily., Disp: , Rfl:    nortriptyline (PAMELOR) 10 MG capsule, Take 1 capsule (10 mg total) by mouth at bedtime., Disp: 90 capsule, Rfl: 1   sulfamethoxazole-trimethoprim (BACTRIM DS) 800-160 MG tablet, Take 1 tablet by mouth 2 (two) times daily., Disp: 14 tablet, Rfl: 0   tadalafil (CIALIS) 20 MG tablet, Take 0.5-1 tablets (10-20 mg total) by mouth every other day as needed for erectile dysfunction., Disp: 10 tablet, Rfl: 11   triamcinolone ointment (KENALOG) 0.5 %, Apply 1 Application topically 2 (two) times daily., Disp: 30 g, Rfl: 0   vitamin C (ASCORBIC ACID) 500 MG tablet, Take 500 mg by mouth daily., Disp: , Rfl:   Observations/Objective: Patient is well-developed, well-nourished in no acute distress.  Resting comfortably at home.  Head is normocephalic, atraumatic.  No labored breathing.  Speech is clear and coherent with logical content.  Patient is alert and oriented at baseline.    Assessment and Plan: 1. Acute bacterial sinusitis - azithromycin (ZITHROMAX) 250 MG tablet; Take 2 tablets on day 1, then 1 tablet daily on days 2 through 5  Dispense: 6 tablet; Refill: 0  - Worsening symptoms that have not responded to OTC medications.  - Will give Zpack (patient reports this works well) - Continue allergy medications.  - Steam and humidifier can help - Stay well hydrated and get plenty of rest.  -  Seek in person evaluation if no symptom improvement or if symptoms worsen   Follow Up Instructions: I discussed the assessment and treatment plan with the patient. The patient was provided an opportunity to ask questions and all were answered. The patient agreed with the plan and demonstrated an understanding of the instructions.  A copy of instructions were sent to the patient via MyChart unless otherwise noted below.    The patient was advised to call back or seek an in-person evaluation if the symptoms worsen or if the condition fails to improve as anticipated.    Margaretann Loveless, PA-C

## 2023-01-29 NOTE — Telephone Encounter (Signed)
  Chief Complaint: Sinus infection  requesting an antibiotic Symptoms: nasal congestion and coughing Frequency: Since Sat. Pertinent Negatives: Patient denies fever Disposition: [] ED /[] Urgent Care (no appt availability in office) / [] Appointment(In office/virtual)/ [x]  [facility]/ [] Home Care/ [] Refused Recommended Disposition /[] [facility]/ []  Follow-up with PCP Additional Notes: Pt scheduled for a MyChart virtual visit for 6:00 today.    No appts available with any providers at [facility].

## 2023-01-29 NOTE — Patient Instructions (Signed)
Christopher Davenport, thank you for joining Margaretann Loveless, PA-C for today's virtual visit.  While this provider is not your primary care provider (PCP), if your PCP is located in our provider database this encounter information will be shared with them immediately following your visit.   A [facility] MyChart account gives you access to today's visit and all your visits, tests, and labs performed at [facility] " click here if you don't have a [facility] MyChart account or go to [email or web address]  Consent: (Patient) Christopher Davenport provided verbal consent for this virtual visit at the beginning of the encounter.  Current Medications:  Current Outpatient Medications:    azithromycin (ZITHROMAX) 250 MG tablet, Take 2 tablets on day 1, then 1 tablet daily on days 2 through 5, Disp: 6 tablet, Rfl: 0   albuterol (PROAIR HFA) 108 (90 Base) MCG/ACT inhaler, inhale 2 puffs by mouth every 4 to 6 hours if needed for SHORTNESS OF BREATH, Disp: 18 g, Rfl: 6   Albuterol-Budesonide (AIRSUPRA) 90-80 MCG/ACT AERO, Inhale 1-2 puffs into the lungs every 4 (four) hours as needed., Disp: 10.7 g, Rfl: 12   b complex vitamins tablet, Take 1 tablet by mouth daily., Disp: , Rfl:    busPIRone (BUSPAR) 10 MG tablet, TAKE 1 TO 1 1/2 TABLETS(10 TO 15 MG) BY MOUTH THREE TIMES DAILY, Disp: 135 tablet, Rfl: 1   CREATINE MONOHYDRATE PO, Take by mouth., Disp: , Rfl:    cyclobenzaprine (FLEXERIL) 10 MG tablet, Take 1 tablet (10 mg total) by mouth at bedtime., Disp: 30 tablet, Rfl: 0   FLUoxetine (PROZAC) 20 MG capsule, Take 1 capsule (20 mg total) by mouth daily., Disp: 90 capsule, Rfl: 1   fluticasone-salmeterol (WIXELA INHUB) 100-50 MCG/ACT AEPB, Inhale 1 puff into the lungs 2 (two) times daily., Disp: 60 each, Rfl: 6   Multiple Vitamin (MULTIVITAMIN WITH MINERALS) TABS tablet, Take 1 tablet by mouth daily., Disp: , Rfl:    nortriptyline (PAMELOR) 10 MG capsule, Take 1 capsule (10 mg total) by mouth  at bedtime., Disp: 90 capsule, Rfl: 1   sulfamethoxazole-trimethoprim (BACTRIM DS) 800-160 MG tablet, Take 1 tablet by mouth 2 (two) times daily., Disp: 14 tablet, Rfl: 0   tadalafil (CIALIS) 20 MG tablet, Take 0.5-1 tablets (10-20 mg total) by mouth every other day as needed for erectile dysfunction., Disp: 10 tablet, Rfl: 11   triamcinolone ointment (KENALOG) 0.5 %, Apply 1 Application topically 2 (two) times daily., Disp: 30 g, Rfl: 0   vitamin C (ASCORBIC ACID) 500 MG tablet, Take 500 mg by mouth daily., Disp: , Rfl:    Medications ordered in this encounter:  Meds ordered this encounter  Medications   azithromycin (ZITHROMAX) 250 MG tablet    Sig: Take 2 tablets on day 1, then 1 tablet daily on days 2 through 5    Dispense:  6 tablet    Refill:  0    Order Specific Question:   Supervising Provider    Answer:   Merrilee Jansky [[id number]]     *If you need refills on other medications prior to your next appointment, please contact your pharmacy*  Follow-Up: Call back or seek an in-person evaluation if the symptoms worsen or if the condition fails to improve as anticipated.  [facility] [phone]  Other Instructions Sinus Infection, Adult A sinus infection, also called sinusitis, is inflammation of your sinuses. Sinuses are hollow spaces in the bones around your face. Your sinuses are located:  Around your eyes. In the middle of your forehead. Behind your nose. In your cheekbones. Mucus normally drains out of your sinuses. When your nasal tissues become inflamed or swollen, mucus can become trapped or blocked. This allows bacteria, viruses, and fungi to grow, which leads to infection. Most infections of the sinuses are caused by a virus. A sinus infection can develop quickly. It can last for up to 4 weeks (acute) or for more than 12 weeks (chronic). A sinus infection often develops after a cold. What are the causes? This condition is caused by anything that creates  swelling in the sinuses or stops mucus from draining. This includes: Allergies. Asthma. Infection from bacteria or viruses. Deformities or blockages in your nose or sinuses. Abnormal growths in the nose (nasal polyps). Pollutants, such as chemicals or irritants in the air. Infection from fungi. This is rare. What increases the risk? You are more likely to develop this condition if you: Have a weak body defense system (immune system). Do a lot of swimming or diving. Overuse nasal sprays. Smoke. What are the signs or symptoms? The main symptoms of this condition are pain and a feeling of pressure around the affected sinuses. Other symptoms include: Stuffy nose or congestion that makes it difficult to breathe through your nose. Thick yellow or greenish drainage from your nose. Tenderness, swelling, and warmth over the affected sinuses. A cough that may get worse at night. Decreased sense of smell and taste. Extra mucus that collects in the throat or the back of the nose (postnasal drip) causing a sore throat or bad breath. Tiredness (fatigue). Fever. How is this diagnosed? This condition is diagnosed based on: Your symptoms. Your medical history. A physical exam. Tests to find out if your condition is acute or chronic. This may include: Checking your nose for nasal polyps. Viewing your sinuses using a device that has a light (endoscope). Testing for allergies or bacteria. Imaging tests, such as an MRI or CT scan. In rare cases, a bone biopsy may be done to rule out more serious types of fungal sinus disease. How is this treated? Treatment for a sinus infection depends on the cause and whether your condition is chronic or acute. If caused by a virus, your symptoms should go away on their own within 10 days. You may be given medicines to relieve symptoms. They include: Medicines that shrink swollen nasal passages (decongestants). A spray that eases inflammation of the nostrils  (topical intranasal corticosteroids). Rinses that help get rid of thick mucus in your nose (nasal saline washes). Medicines that treat allergies (antihistamines). Over-the-counter pain relievers. If caused by bacteria, your health care provider may recommend waiting to see if your symptoms improve. Most bacterial infections will get better without antibiotic medicine. You may be given antibiotics if you have: A severe infection. A weak immune system. If caused by narrow nasal passages or nasal polyps, surgery may be needed. Follow these instructions at home: Medicines Take, use, or apply over-the-counter and prescription medicines only as told by your health care provider. These may include nasal sprays. If you were prescribed an antibiotic medicine, take it as told by your health care provider. Do not stop taking the antibiotic even if you start to feel better. Hydrate and humidify  Drink enough fluid to keep your urine pale yellow. Staying hydrated will help to thin your mucus. Use a cool mist humidifier to keep the humidity level in your home above 50%. Inhale steam for 10-15 minutes, 3-4 times a  day, or as told by your health care provider. You can do this in the bathroom while a hot shower is running. Limit your exposure to cool or dry air. Rest Rest as much as possible. Sleep with your head raised (elevated). Make sure you get enough sleep each night. General instructions  Apply a warm, moist washcloth to your face 3-4 times a day or as told by your health care provider. This will help with discomfort. Use nasal saline washes as often as told by your health care provider. Wash your hands often with soap and water to reduce your exposure to germs. If soap and water are not available, use hand sanitizer. Do not smoke. Avoid being around people who are smoking (secondhand smoke). Keep all follow-up visits. This is important. Contact a health care provider if: You have a fever. Your  symptoms get worse. Your symptoms do not improve within 10 days. Get help right away if: You have a severe headache. You have persistent vomiting. You have severe pain or swelling around your face or eyes. You have vision problems. You develop confusion. Your neck is stiff. You have trouble breathing. These symptoms may be an emergency. Get help right away. Call 911. Do not wait to see if the symptoms will go away. Do not drive yourself to the hospital. Summary A sinus infection is soreness and inflammation of your sinuses. Sinuses are hollow spaces in the bones around your face. This condition is caused by nasal tissues that become inflamed or swollen. The swelling traps or blocks the flow of mucus. This allows bacteria, viruses, and fungi to grow, which leads to infection. If you were prescribed an antibiotic medicine, take it as told by your health care provider. Do not stop taking the antibiotic even if you start to feel better. Keep all follow-up visits. This is important. This information is not intended to replace advice given to you by your health care provider. Make sure you discuss any questions you have with your health care provider. Document Revised: 02/11/2021 Document Reviewed: 02/11/2021 Elsevier Patient Education  2024 [employer].    If you have been instructed to have an in-person evaluation today at a local Urgent Care facility, please use the link below. It will take you to a list of all of our available [facility], including address, phone number and hours of operation. Please do not delay care.  [facility]  If you or a family member do not have a primary care provider, use the link below to schedule a visit and establish care. When you choose a [facility] primary care physician or advanced practice provider, you gain a long-term partner in health. Find a Primary Care Provider  Learn more about [facility] in-office and virtual care  options:  - Get Care Now

## 2023-01-29 NOTE — Telephone Encounter (Signed)
Message from Lennox Pippins sent at 01/29/2023  3:19 PM EST  Summary: requesting an antibiotic for sinus infection   Patient called and states he has had a sinus infection since last Saturday and he states when his sinus infection won't go away that Dr Laural Benes normally prescribes him a [facility] and it will knock it out. Patient is requesting this to be called in today if possible. No fever.  Patients callback #  [phone]          Call History  Contact Date/Time Type Contact Phone/Fax User  01/29/2023 03:17 PM EST Phone (Incoming) Roane, Dambrosio F (Self) [phone] Rexene Edison) Muck, Army Melia   Reason for Disposition  Lots of coughing  Answer Assessment - Initial Assessment Questions 1. LOCATION: "Where does it hurt?"      Has a sinus infection.  Requesting an antibiotic.    I let him know he would need to be seen in order to have an antibiotic prescribed. 2. ONSET: "When did the sinus pain start?"  (e.g., hours, days)      Since Saturday 3. SEVERITY: "How bad is the pain?"   (Scale 1-10; mild, moderate or severe)   - MILD (1-3): doesn't interfere with normal activities    - MODERATE (4-7): interferes with normal activities (e.g., work or school) or awakens from sleep   - SEVERE (8-10): excruciating pain and patient unable to do any normal activities        [place] 4. RECURRENT SYMPTOM: "Have you ever had sinus problems before?" If Yes, ask: "When was the last time?" and "What happened that time?"      Yes 5. NASAL CONGESTION: "Is the nose blocked?" If Yes, ask: "Can you open it or must you breathe through your mouth?"     Yes 6. NASAL DISCHARGE: "Do you have discharge from your nose?" If so ask, "What color?"     Yes 7. FEVER: "Do you have a fever?" If Yes, ask: "What is it, how was it measured, and when did it start?"      No 8. OTHER SYMPTOMS: "Do you have any other symptoms?" (e.g., sore throat, cough, earache, difficulty breathing)     Coughing 9. PREGNANCY: "Is there any chance  you are pregnant?" "When was your last menstrual period?"     N/A  Protocols used: Sinus Pain or Congestion-A-AH

## 2023-02-25 ENCOUNTER — Ambulatory Visit (INDEPENDENT_AMBULATORY_CARE_PROVIDER_SITE_OTHER): Payer: BC Managed Care – PPO | Admitting: Family Medicine

## 2023-02-25 ENCOUNTER — Encounter: Payer: Self-pay | Admitting: Family Medicine

## 2023-02-25 VITALS — BP 94/66 | HR 83 | Ht 73.0 in | Wt 214.8 lb

## 2023-02-25 DIAGNOSIS — J189 Pneumonia, unspecified organism: Secondary | ICD-10-CM | POA: Diagnosis not present

## 2023-02-25 DIAGNOSIS — J040 Acute laryngitis: Secondary | ICD-10-CM | POA: Diagnosis not present

## 2023-02-25 MED ORDER — AZITHROMYCIN 250 MG PO TABS: ORAL_TABLET | ORAL | 0 refills | Status: AC

## 2023-02-25 MED ORDER — PREDNISONE 50 MG PO TABS: 50.0000 mg | ORAL_TABLET | Freq: Every day | ORAL | 0 refills | Status: DC

## 2023-02-25 NOTE — Progress Notes (Signed)
BP 94/66   Pulse 83   Ht 6\' 1"  (1.854 m)   Wt 214 lb 12.8 oz (97.4 kg)   SpO2 100%   BMI 28.34 kg/m    Subjective:    Patient ID: Christopher Davenport, male    DOB: December 09, 1975, 47 y.o.   MRN: [id number]  HPI: Christopher Davenport is a 47 y.o. male  Chief Complaint  Patient presents with   Cough    Patient says he has a cough for the past to the point he has loss his voice. Patient says he has also noticed some wheezing. Patient says he is taking Robitussin DM during the day and Nyquil at night. Patient says it seems to help a little bit.    Wheezing   UPPER RESPIRATORY TRACT INFECTION Duration: 5 days Worst symptom: laryngitis, cough Fever: no Cough: yes Shortness of breath: no Wheezing: yes Chest pain: no Chest tightness: yes Chest congestion: yes Nasal congestion: no Runny nose: no Post nasal drip: no Sneezing: no Sore throat: no Swollen glands: no Sinus pressure: no Headache: yes Face pain: no Toothache: no Ear pain: no  Ear pressure: no  Eyes red/itching:no Eye drainage/crusting: no  Vomiting: no Rash: no Fatigue: yes Sick contacts: no Strep contacts: no  Context: worse Recurrent sinusitis: no Relief with OTC cold/cough medications: no  Treatments attempted:  robutussion DM and nyquil  Relevant past medical, surgical, family and social history reviewed and updated as indicated. Interim medical history since our last visit reviewed. Allergies and medications reviewed and updated.  Review of Systems  Constitutional:  Positive for fatigue. Negative for activity change, appetite change, chills, diaphoresis, fever and unexpected weight change.  HENT:  Positive for congestion, postnasal drip and voice change. Negative for dental problem, drooling, ear discharge, ear pain, facial swelling, hearing loss, mouth sores, nosebleeds, rhinorrhea, sinus pressure, sinus pain, sneezing, sore throat, tinnitus and trouble swallowing.   Eyes: Negative.   Respiratory:   Positive for cough. Negative for apnea, choking, chest tightness, shortness of breath, wheezing and stridor.   Cardiovascular: Negative.   Gastrointestinal: Negative.   Psychiatric/Behavioral: Negative.      Per HPI unless specifically indicated above     Objective:    BP 94/66   Pulse 83   Ht 6\' 1"  (1.854 m)   Wt 214 lb 12.8 oz (97.4 kg)   SpO2 100%   BMI 28.34 kg/m   Wt Readings from Last 3 Encounters:  02/25/23 214 lb 12.8 oz (97.4 kg)  12/23/22 216 lb 3.2 oz (98.1 kg)  11/16/22 215 lb 12.8 oz (97.9 kg)    Physical Exam Vitals and nursing note reviewed.  Constitutional:      General: He is not in acute distress.    Appearance: Normal appearance. He is not ill-appearing, toxic-appearing or diaphoretic.  HENT:     Head: Normocephalic and atraumatic.     Right Ear: Tympanic membrane, ear canal and external ear normal. There is no impacted cerumen.     Left Ear: Tympanic membrane, ear canal and external ear normal. There is no impacted cerumen.     Nose: Rhinorrhea present. No congestion.     Mouth/Throat:     Mouth: Mucous membranes are moist.     Pharynx: Oropharynx is clear. No oropharyngeal exudate or posterior oropharyngeal erythema.  Eyes:     General: No scleral icterus.       Right eye: No discharge.        Left eye: No discharge.  Extraocular Movements: Extraocular movements intact.     Conjunctiva/sclera: Conjunctivae normal.     Pupils: Pupils are equal, round, and reactive to light.  Cardiovascular:     Rate and Rhythm: Normal rate and regular rhythm.     Pulses: Normal pulses.     Heart sounds: Normal heart sounds. No murmur heard.    No friction rub. No gallop.  Pulmonary:     Effort: Pulmonary effort is normal. No respiratory distress.     Breath sounds: No stridor. Rhonchi (fine rhonchi throughout lungs) present. No wheezing or rales.  Chest:     Chest wall: No tenderness.  Musculoskeletal:        General: Normal range of motion.     Cervical  back: Normal range of motion and neck supple.  Skin:    General: Skin is warm and dry.     Capillary Refill: Capillary refill takes less than 2 seconds.     Coloration: Skin is not jaundiced or pale.     Findings: No bruising, erythema, lesion or rash.  Neurological:     General: No focal deficit present.     Mental Status: He is alert and oriented to person, place, and time. Mental status is at baseline.  Psychiatric:        Mood and Affect: Mood normal.        Behavior: Behavior normal.        Thought Content: Thought content normal.        Judgment: Judgment normal.     Results for orders placed or performed in visit on 12/02/22  Basic metabolic panel  Result Value Ref Range   Glucose 90 70 - 99 mg/dL   BUN 19 6 - 24 mg/dL   Creatinine, Ser 6.30 (H) 0.76 - 1.27 mg/dL   eGFR 66 >16 WF/UXN/2.35   BUN/Creatinine Ratio 14 9 - 20   Sodium 137 134 - 144 mmol/L   Potassium 3.7 3.5 - 5.2 mmol/L   Chloride 99 96 - 106 mmol/L   CO2 24 20 - 29 mmol/L   Calcium 9.0 8.7 - 10.2 mg/dL      Assessment & Plan:   Problem List Items Addressed This Visit   None Visit Diagnoses     Atypical pneumonia    -  Primary   Will treat with azithromycin. Call with any concerns. Continue to monitor.   Relevant Medications   azithromycin (ZITHROMAX) 250 MG tablet   Laryngitis       Will treat with prednisone. Call with any concerns. Continue to monitor.        Follow up plan: Return for As scheduled.

## 2023-04-13 ENCOUNTER — Telehealth: Payer: Self-pay | Admitting: Family Medicine

## 2023-04-13 NOTE — Telephone Encounter (Signed)
Pt is calling in because he wants to speak with his pcp's nurse regarding changing his anxiety medication for something that helps his symptoms better. He is also requesting some recommendations for a psychiatrist in the area. Please follow up with pt.

## 2023-04-13 NOTE — Telephone Encounter (Signed)
Left message for patient to give our office a call back to gather more information and discuss.   OK for [facility] to gather more information regarding patient's previous telephone encounter if patient calls back.

## 2023-04-14 NOTE — Telephone Encounter (Signed)
MyChart message has been sent to patient as well to clarify telephone encounter regarding his questions about his current medication. Advise patient to reach back out to assist with questions.

## 2023-04-19 ENCOUNTER — Ambulatory Visit (INDEPENDENT_AMBULATORY_CARE_PROVIDER_SITE_OTHER): Payer: BC Managed Care – PPO | Admitting: Family Medicine

## 2023-04-19 ENCOUNTER — Encounter: Payer: Self-pay | Admitting: Family Medicine

## 2023-04-19 VITALS — BP 123/83 | HR 67 | Temp 98.6°F | Wt 236.6 lb

## 2023-04-19 DIAGNOSIS — F411 Generalized anxiety disorder: Secondary | ICD-10-CM | POA: Diagnosis not present

## 2023-04-19 DIAGNOSIS — F339 Major depressive disorder, recurrent, unspecified: Secondary | ICD-10-CM | POA: Insufficient documentation

## 2023-04-19 MED ORDER — SERTRALINE HCL 100 MG PO TABS: 100.0000 mg | ORAL_TABLET | Freq: Every day | ORAL | 3 refills | Status: DC

## 2023-04-19 NOTE — Progress Notes (Signed)
BP 123/83   Pulse 67   Temp 98.6 F (37 C) (Oral)   Wt 236 lb 9.6 oz (107.3 kg)   SpO2 100%   BMI 31.22 kg/m    Subjective:    Patient ID: Christopher Davenport, male    DOB: 1976-01-13, 48 y.o.   MRN: [id number]  HPI: Christopher Davenport is a 48 y.o. male  Chief Complaint  Patient presents with   Mood    Patient says he has been on Fluoxetine for years now and he says he would like to discuss a different medication to treat his Anxiety and Depression all in one if possible. Patient says he has noticed some side effects that he didn't like, as he increased his dose of Fluoxetine. Patient has been off the medication now for 3 weeks and says he doesn't noticed that much of a difference mood wise and always irritability.    ANXIETY/DEPRESSION Duration: chronic Status:exacerbated Anxious mood: yes  Excessive worrying: yes Irritability: yes  Sweating: no Nausea: no Palpitations:no Hyperventilation: no Panic attacks: no Agoraphobia: no  Obscessions/compulsions: yes Depressed mood: yes    04/19/2023    8:44 AM 02/25/2023    1:46 PM 12/23/2022    8:19 AM 11/16/2022    9:26 AM 06/15/2022    9:17 AM  Depression screen PHQ 2/9  Decreased Interest 1 0 1 1 0  Down, Depressed, Hopeless 1 1 2 1  0  PHQ - 2 Score 2 1 3 2  0  Altered sleeping 3 1 2 3  0  Tired, decreased energy 3 1 2 2 2   Change in appetite 3 0 1 1 0  Feeling bad or failure about yourself  1 0 0 0 0  Trouble concentrating 3 1 2 2 1   Moving slowly or fidgety/restless 1 0 1 1 0  Suicidal thoughts 0 0 0 0 0  PHQ-9 Score 16 4 11 11 3   Difficult doing work/chores Somewhat difficult Somewhat difficult  Somewhat difficult       04/19/2023    8:44 AM 02/25/2023    1:47 PM 12/23/2022    8:19 AM 11/16/2022    9:26 AM  GAD 7 : Generalized Anxiety Score  Nervous, Anxious, on Edge 3 2 3 2   Control/stop worrying 3 1 2 2   Worry too much - different things 3 2 3 2   Trouble relaxing 3 2 3 2   Restless 3 1 1 1   Easily annoyed or  irritable 3 2 2 2   Afraid - awful might happen 0 0 0 0  Total GAD 7 Score 18 10 14 11   Anxiety Difficulty Somewhat difficult Somewhat difficult  Somewhat difficult   Anhedonia: no Weight changes: no Insomnia: no   Hypersomnia: no Fatigue/loss of energy: yes Feelings of worthlessness: yes Feelings of guilt: yes Impaired concentration/indecisiveness: yes Suicidal ideations: no  Crying spells: no Recent Stressors/Life Changes: yes   Relationship problems: no   Family stress: no     Financial stress: yes    Job stress: yes    Recent death/loss: no  Relevant past medical, surgical, family and social history reviewed and updated as indicated. Interim medical history since our last visit reviewed. Allergies and medications reviewed and updated.  Review of Systems  Constitutional: Negative.   Respiratory: Negative.    Cardiovascular: Negative.   Musculoskeletal: Negative.   Psychiatric/Behavioral:  Positive for decreased concentration and dysphoric mood. Negative for agitation, behavioral problems, confusion, hallucinations, self-injury, sleep disturbance and suicidal ideas. The patient is nervous/anxious.  The patient is not hyperactive.     Per HPI unless specifically indicated above     Objective:    BP 123/83   Pulse 67   Temp 98.6 F (37 C) (Oral)   Wt 236 lb 9.6 oz (107.3 kg)   SpO2 100%   BMI 31.22 kg/m   Wt Readings from Last 3 Encounters:  04/19/23 236 lb 9.6 oz (107.3 kg)  02/25/23 214 lb 12.8 oz (97.4 kg)  12/23/22 216 lb 3.2 oz (98.1 kg)    Physical Exam Vitals and nursing note reviewed.  Constitutional:      General: He is not in acute distress.    Appearance: Normal appearance. He is not ill-appearing, toxic-appearing or diaphoretic.  HENT:     Head: Normocephalic and atraumatic.     Right Ear: External ear normal.     Left Ear: External ear normal.     Nose: Nose normal.     Mouth/Throat:     Mouth: Mucous membranes are moist.     Pharynx:  Oropharynx is clear.  Eyes:     General: No scleral icterus.       Right eye: No discharge.        Left eye: No discharge.     Extraocular Movements: Extraocular movements intact.     Conjunctiva/sclera: Conjunctivae normal.     Pupils: Pupils are equal, round, and reactive to light.  Cardiovascular:     Rate and Rhythm: Normal rate and regular rhythm.     Pulses: Normal pulses.     Heart sounds: Normal heart sounds. No murmur heard.    No friction rub. No gallop.  Pulmonary:     Effort: Pulmonary effort is normal. No respiratory distress.     Breath sounds: Normal breath sounds. No stridor. No wheezing, rhonchi or rales.  Chest:     Chest wall: No tenderness.  Musculoskeletal:        General: Normal range of motion.     Cervical back: Normal range of motion and neck supple.  Skin:    General: Skin is warm and dry.     Capillary Refill: Capillary refill takes less than 2 seconds.     Coloration: Skin is not jaundiced or pale.     Findings: No bruising, erythema, lesion or rash.  Neurological:     General: No focal deficit present.     Mental Status: He is alert and oriented to person, place, and time. Mental status is at baseline.  Psychiatric:        Mood and Affect: Mood normal.        Behavior: Behavior normal.        Thought Content: Thought content normal.        Judgment: Judgment normal.     Results for orders placed or performed in visit on 12/02/22  Basic metabolic panel   Collection Time: 12/02/22  8:37 AM  Result Value Ref Range   Glucose 90 70 - 99 mg/dL   BUN 19 6 - 24 mg/dL   Creatinine, Ser 9.60 (H) 0.76 - 1.27 mg/dL   eGFR 66 >45 WU/JWJ/1.91   BUN/Creatinine Ratio 14 9 - 20   Sodium 137 134 - 144 mmol/L   Potassium 3.7 3.5 - 5.2 mmol/L   Chloride 99 96 - 106 mmol/L   CO2 24 20 - 29 mmol/L   Calcium 9.0 8.7 - 10.2 mg/dL      Assessment & Plan:   Problem List Items Addressed This  Visit       Other   Anxiety disorder - Primary   Not doing well.  Will change his fluoxetine to sertraline and recheck in 2-4 weeks. May need to add wellbutrin. Call with any concerns. Continue to monitor.       Relevant Medications   sertraline (ZOLOFT) 100 MG tablet   Depression, recurrent (HCC)   Not doing well. Will change his fluoxetine to sertraline and recheck in 2-4 weeks. May need to add wellbutrin. Call with any concerns. Continue to monitor.       Relevant Medications   sertraline (ZOLOFT) 100 MG tablet     Follow up plan: Return for As scheduled.

## 2023-04-19 NOTE — Assessment & Plan Note (Signed)
Not doing well. Will change his fluoxetine to sertraline and recheck in 2-4 weeks. May need to add wellbutrin. Call with any concerns. Continue to monitor.

## 2023-04-26 ENCOUNTER — Telehealth: Payer: Self-pay | Admitting: Family Medicine

## 2023-04-26 ENCOUNTER — Ambulatory Visit: Payer: Self-pay

## 2023-04-26 NOTE — Telephone Encounter (Signed)
Chief Complaint: Reaction to Zoloft Symptoms: tingling/itching to lips, diffculty swallowing Frequency: Onset today after taking zoloft Pertinent Negatives: Patient denies other symptoms Disposition: [x] ED /[] Urgent Care (no appt availability in office) / [] Appointment(In office/virtual)/ []  [facility]/ [] Home Care/ [x] Refused Recommended Disposition /[] [facility]/ []  Follow-up with PCP Additional Notes: Patient called and says on Saturday he started taking the prescribed Zoloft 200 mg. He says he thought the dose was too high, so he didn't take it on Sunday. Today he took 50 mg of Zoloft and now he says he has tingling and some itching to his lips and he's having a little difficulty swallowing. I asked was it like this on Saturday. He says not that he noticed. I advised due to the difficulty swallowing to go to the ED for evaluation because the throat is swelling. He says he has too much to do at work and has already been out of work and asks if he can take benadryl. I explained that benadryl is short acting for allergic reactions and may help decrease the symptoms, but with the throat swelling and difficulty swallowing, he will need to go to the ED for evaluation. He says he will just go buy the benadryl and take that, then if he's not feeling any better, he will go. Advised if he's starting to have trouble breathing, call 911. Advised I will send this to Dr. Laural Benes or her recommendation and someone will call him back. He verbalized understanding.   Reason for Disposition  [1] Caller has URGENT medicine question about med that PCP or specialist prescribed AND [2] triager unable to answer question  Answer Assessment - Initial Assessment Questions 1. NAME of MEDICINE: "What medicine(s) are you calling about?"     Zoloft  2. PRESCRIBER: "Who prescribed the medicine?" Reason: if prescribed by specialist, call should be referred to that group.     Dr. Laural Benes 3. SYMPTOMS: "Do  you have any symptoms?" If Yes, ask: "What symptoms are you having?"  "How bad are the symptoms (e.g., mild, moderate, severe)     Tingling and itching to lips and a little difficulty swallowing  Protocols used: Medication Question Call-A-AH

## 2023-04-26 NOTE — Telephone Encounter (Signed)
Called patient he stated that he was only taking the 100 mg not 200mg  he cut the pill in half and still had reaction. Will stop taking  until appt

## 2023-04-26 NOTE — Telephone Encounter (Signed)
Copied from CRM [id number]. Topic: General - Other >> Apr 26, 2023  2:17 PM Everette C wrote: Reason for CRM: The patient would like to be contacted by a member of practice staff when possible to continue ongoing discussions  The patient was unable to recall the name of the staff member they spoke with earlier  Please contact when possible

## 2023-04-26 NOTE — Telephone Encounter (Signed)
Should only be taking 100mg  zoloft- appt please

## 2023-04-29 ENCOUNTER — Encounter: Payer: Self-pay | Admitting: Family Medicine

## 2023-04-29 ENCOUNTER — Ambulatory Visit (INDEPENDENT_AMBULATORY_CARE_PROVIDER_SITE_OTHER): Payer: BC Managed Care – PPO | Admitting: Family Medicine

## 2023-04-29 VITALS — BP 124/89 | HR 82 | Ht 73.0 in | Wt 227.8 lb

## 2023-04-29 DIAGNOSIS — K219 Gastro-esophageal reflux disease without esophagitis: Secondary | ICD-10-CM | POA: Diagnosis not present

## 2023-04-29 DIAGNOSIS — F339 Major depressive disorder, recurrent, unspecified: Secondary | ICD-10-CM | POA: Diagnosis not present

## 2023-04-29 DIAGNOSIS — F411 Generalized anxiety disorder: Secondary | ICD-10-CM | POA: Diagnosis not present

## 2023-04-29 MED ORDER — BUSPIRONE HCL 10 MG PO TABS: ORAL_TABLET | ORAL | 1 refills | Status: DC

## 2023-04-29 MED ORDER — OMEPRAZOLE 20 MG PO CPDR: 20.0000 mg | DELAYED_RELEASE_CAPSULE | Freq: Every day | ORAL | 3 refills | Status: DC

## 2023-04-29 MED ORDER — CITALOPRAM HYDROBROMIDE 10 MG PO TABS: ORAL_TABLET | ORAL | 3 refills | Status: DC

## 2023-04-29 NOTE — Assessment & Plan Note (Signed)
 Reaction to sertraline . Will start him on lower dose of celexa  and recheck in 3 weeks. Call with any issues. Continue to monitor.

## 2023-04-29 NOTE — Progress Notes (Signed)
 BP 124/89   Pulse 82   Ht 6' 1 (1.854 m)   Wt 227 lb 12.8 oz (103.3 kg)   SpO2 100%   BMI 30.05 kg/m    Subjective:    Patient ID: Christopher Davenport, male    DOB: [birth year], 48 y.o.   MRN: [id number]  HPI: Christopher Davenport is a 48 y.o. male  Chief Complaint  Patient presents with   Medication Reaction    Patient says he is not sure if he has Acid Reflux or a reaction to the medication. Patient says he experiencing tingling in his lips and difficulty swallowing. Patient says he thinks at first it may have been an allergic reaction, but he thinks it may have been acid reflux, but has since stopped the medication.    Was anxious about taking his medicine. He took 1 pill and felt like he was very medicated. Then he notes that he went to sleep and woke up with lips tingling and trouble swallowing. He notes that he had some discomfort in his chest. He tried a 1/2 a pill a couple of days later and continues with the the issue in his chest. He is concerned that this was more acid reflux. He has not been taking any of the sertraline  since then, but has continued with issues swallowing and acid reflux.    ANXIETY/DEPRESSION Duration: chronic Status:exacerbated Anxious mood: yes  Excessive worrying: yes Irritability: yes  Sweating: no Nausea: no Palpitations:yes Hyperventilation: no Panic attacks: yes Agoraphobia: no  Obscessions/compulsions: yes Depressed mood: yes    04/29/2023    9:36 AM 04/19/2023    8:44 AM 02/25/2023    1:46 PM 12/23/2022    8:19 AM 11/16/2022    9:26 AM  Depression screen PHQ 2/9  Decreased Interest 1 1 0 1 1  Down, Depressed, Hopeless 1 1 1 2 1   PHQ - 2 Score 2 2 1 3 2   Altered sleeping 1 3 1 2 3   Tired, decreased energy 1 3 1 2 2   Change in appetite 1 3 0 1 1  Feeling bad or failure about yourself  0 1 0 0 0  Trouble concentrating 2 3 1 2 2   Moving slowly or fidgety/restless 1 1 0 1 1  Suicidal thoughts 0 0 0 0 0  PHQ-9 Score 8 16 4 11 11   Difficult  doing work/chores Somewhat difficult Somewhat difficult Somewhat difficult  Somewhat difficult   Anhedonia: no Weight changes: no Insomnia: yes   Hypersomnia: yes Fatigue/loss of energy: yes Feelings of worthlessness: yes Feelings of guilt: yes Impaired concentration/indecisiveness: yes Suicidal ideations: no  Crying spells: no Recent Stressors/Life Changes: yes   Relationship problems: no   Family stress: no     Financial stress: yes    Job stress: yes    Recent death/loss: no  GERD GERD control status: exacerbated Satisfied with current treatment? no Heartburn frequency: daily for the past few days Medication side effects: no  Medication compliance: N/A Dysphagia: yes Odynophagia:  no Hematemesis: no Blood in stool: no EGD: no   Relevant past medical, surgical, family and social history reviewed and updated as indicated. Interim medical history since our last visit reviewed. Allergies and medications reviewed and updated.  Review of Systems  Constitutional: Negative.   HENT:  Positive for trouble swallowing. Negative for congestion, dental problem, drooling, ear discharge, ear pain, facial swelling, hearing loss, mouth sores, nosebleeds, postnasal drip, rhinorrhea, sinus pressure, sinus pain, sneezing, sore throat, tinnitus and  voice change.   Respiratory: Negative.    Cardiovascular: Negative.   Gastrointestinal:  Positive for diarrhea. Negative for abdominal distention, abdominal pain, anal bleeding, blood in stool, constipation, nausea, rectal pain and vomiting.  Musculoskeletal: Negative.   Skin: Negative.   Psychiatric/Behavioral:  Negative for agitation, behavioral problems, confusion, decreased concentration, dysphoric mood, hallucinations, self-injury, sleep disturbance and suicidal ideas. The patient is nervous/anxious. The patient is not hyperactive.     Per HPI unless specifically indicated above     Objective:    BP 124/89   Pulse 82   Ht 6' 1  (1.854 m)   Wt 227 lb 12.8 oz (103.3 kg)   SpO2 100%   BMI 30.05 kg/m   Wt Readings from Last 3 Encounters:  04/29/23 227 lb 12.8 oz (103.3 kg)  04/19/23 236 lb 9.6 oz (107.3 kg)  02/25/23 214 lb 12.8 oz (97.4 kg)    Physical Exam Vitals and nursing note reviewed.  Constitutional:      General: He is not in acute distress.    Appearance: Normal appearance. He is not ill-appearing, toxic-appearing or diaphoretic.  HENT:     Head: Normocephalic and atraumatic.     Right Ear: External ear normal.     Left Ear: External ear normal.     Nose: Nose normal.     Mouth/Throat:     Mouth: Mucous membranes are moist.     Pharynx: Oropharynx is clear.  Eyes:     General: No scleral icterus.       Right eye: No discharge.        Left eye: No discharge.     Extraocular Movements: Extraocular movements intact.     Conjunctiva/sclera: Conjunctivae normal.     Pupils: Pupils are equal, round, and reactive to light.  Cardiovascular:     Rate and Rhythm: Normal rate and regular rhythm.     Pulses: Normal pulses.     Heart sounds: Normal heart sounds. No murmur heard.    No friction rub. No gallop.  Pulmonary:     Effort: Pulmonary effort is normal. No respiratory distress.     Breath sounds: Normal breath sounds. No stridor. No wheezing, rhonchi or rales.  Chest:     Chest wall: No tenderness.  Musculoskeletal:        General: Normal range of motion.     Cervical back: Normal range of motion and neck supple.  Skin:    General: Skin is warm and dry.     Capillary Refill: Capillary refill takes less than 2 seconds.     Coloration: Skin is not jaundiced or pale.     Findings: No bruising, erythema, lesion or rash.  Neurological:     General: No focal deficit present.     Mental Status: He is alert and oriented to person, place, and time. Mental status is at baseline.  Psychiatric:        Mood and Affect: Mood normal.        Behavior: Behavior normal.        Thought Content: Thought  content normal.        Judgment: Judgment normal.     Results for orders placed or performed in visit on 12/02/22  Basic metabolic panel   Collection Time: 12/02/22  8:37 AM  Result Value Ref Range   Glucose 90 70 - 99 mg/dL   BUN 19 6 - 24 mg/dL   Creatinine, Ser 8.67 (H) 0.76 - 1.27 mg/dL  eGFR 66 >59 mL/min/1.73   BUN/Creatinine Ratio 14 9 - 20   Sodium 137 134 - 144 mmol/L   Potassium 3.7 3.5 - 5.2 mmol/L   Chloride 99 96 - 106 mmol/L   CO2 24 20 - 29 mmol/L   Calcium 9.0 8.7 - 10.2 mg/dL      Assessment & Plan:   Problem List Items Addressed This Visit       Other   Anxiety disorder - Primary   Reaction to sertraline . Will start him on lower dose of celexa  and recheck in 3 weeks. Call with any issues. Continue to monitor.       Relevant Medications   citalopram  (CELEXA ) 10 MG tablet   busPIRone  (BUSPAR ) 10 MG tablet   Depression, recurrent (HCC)   Reaction to sertraline . Will start him on lower dose of celexa  and recheck in 3 weeks. Call with any issues. Continue to monitor.       Relevant Medications   citalopram  (CELEXA ) 10 MG tablet   busPIRone  (BUSPAR ) 10 MG tablet   Other Visit Diagnoses       Gastroesophageal reflux disease, unspecified whether esophagitis present       Will start omeprazole . Recheck 3 weeks as scheduled.   Relevant Medications   omeprazole  (PRILOSEC) 20 MG capsule        Follow up plan: Return for As scheduled.

## 2023-05-03 NOTE — Telephone Encounter (Signed)
 Patient seen provider on 04-29-23

## 2023-05-19 ENCOUNTER — Ambulatory Visit: Payer: BC Managed Care – PPO | Admitting: Family Medicine

## 2023-05-28 ENCOUNTER — Encounter: Payer: Self-pay | Admitting: Family Medicine

## 2023-05-28 ENCOUNTER — Ambulatory Visit: Payer: BC Managed Care – PPO | Admitting: Family Medicine

## 2023-05-28 VITALS — BP 107/72 | HR 73 | Temp 98.2°F | Ht 73.0 in | Wt 227.8 lb

## 2023-05-28 DIAGNOSIS — J4520 Mild intermittent asthma, uncomplicated: Secondary | ICD-10-CM

## 2023-05-28 DIAGNOSIS — G43001 Migraine without aura, not intractable, with status migrainosus: Secondary | ICD-10-CM | POA: Diagnosis not present

## 2023-05-28 DIAGNOSIS — F339 Major depressive disorder, recurrent, unspecified: Secondary | ICD-10-CM

## 2023-05-28 DIAGNOSIS — F411 Generalized anxiety disorder: Secondary | ICD-10-CM

## 2023-05-28 MED ORDER — ALBUTEROL SULFATE HFA 108 (90 BASE) MCG/ACT IN AERS: INHALATION_SPRAY | RESPIRATORY_TRACT | 6 refills | Status: DC

## 2023-05-28 MED ORDER — FLUTICASONE-SALMETEROL 100-50 MCG/ACT IN AEPB
1.0000 | INHALATION_SPRAY | Freq: Two times a day (BID) | RESPIRATORY_TRACT | 6 refills | Status: DC
Start: 2023-05-28 — End: 2023-11-03

## 2023-05-28 MED ORDER — NORTRIPTYLINE HCL 10 MG PO CAPS: 10.0000 mg | ORAL_CAPSULE | Freq: Every day | ORAL | 1 refills | Status: DC

## 2023-05-28 NOTE — Assessment & Plan Note (Signed)
 Under good control on current regimen. Continue current regimen. Continue to monitor. Call with any concerns. Refills given.

## 2023-05-28 NOTE — Assessment & Plan Note (Signed)
 Will check back in in 2 weeks. Call with any concerns. Continue to monitor.

## 2023-05-28 NOTE — Progress Notes (Signed)
 BP 107/72   Pulse 73   Temp 98.2 F (36.8 C) (Oral)   Ht 6\' 1"  (1.854 m)   Wt 227 lb 12.8 oz (103.3 kg)   SpO2 100%   BMI 30.05 kg/m    Subjective:    Patient ID: Christopher Davenport, male    DOB: [birth year], 48 y.o.   MRN: [id number]  HPI: Christopher Davenport is a 48 y.o. male  Chief Complaint  Patient presents with   Anxiety   Depression   ANXIETY/DEPRESSION Duration: chronic Status:better Anxious mood: yes  Excessive worrying: no Irritability: no  Sweating: no Nausea: no Palpitations:no Hyperventilation: no Panic attacks: no Agoraphobia: no  Obscessions/compulsions: no Depressed mood: yes    05/28/2023    9:35 AM 04/29/2023    9:36 AM 04/19/2023    8:44 AM 02/25/2023    1:46 PM 12/23/2022    8:19 AM  Depression screen PHQ 2/9  Decreased Interest 1 1 1  0 1  Down, Depressed, Hopeless 1 1 1 1 2   PHQ - 2 Score 2 2 2 1 3   Altered sleeping 2 1 3 1 2   Tired, decreased energy 2 1 3 1 2   Change in appetite 1 1 3  0 1  Feeling bad or failure about yourself  0 0 1 0 0  Trouble concentrating 1 2 3 1 2   Moving slowly or fidgety/restless 0 1 1 0 1  Suicidal thoughts 0 0 0 0 0  PHQ-9 Score 8 8 16 4 11   Difficult doing work/chores Somewhat difficult Somewhat difficult Somewhat difficult Somewhat difficult       05/28/2023    9:36 AM 04/29/2023    9:36 AM 04/19/2023    8:44 AM 02/25/2023    1:47 PM  GAD 7 : Generalized Anxiety Score  Nervous, Anxious, on Edge 1 2 3 2   Control/stop worrying 0 2 3 1   Worry too much - different things 1 2 3 2   Trouble relaxing 1 2 3 2   Restless 0 2 3 1   Easily annoyed or irritable 0 2 3 2   Afraid - awful might happen 0 0 0 0  Total GAD 7 Score 3 12 18 10   Anxiety Difficulty Somewhat difficult Somewhat difficult Somewhat difficult Somewhat difficult   Anhedonia: no Weight changes: no Insomnia: no   Hypersomnia: no Fatigue/loss of energy: yes Feelings of worthlessness: no Feelings of guilt: no Impaired concentration/indecisiveness:  no Suicidal ideations: no  Crying spells: no Recent Stressors/Life Changes: no   Relationship problems: no   Family stress: no     Financial stress: no    Job stress: no    Recent death/loss: no  ASTHMA Asthma status: controlled Satisfied with current treatment?: yes Albuterol/rescue inhaler frequency: occasionally Dyspnea frequency: rarely Wheezing frequency: rarely Cough frequency: rarely Nocturnal symptom frequency: never  Limitation of activity: no Current upper respiratory symptoms: no Aerochamber/spacer use: no Visits to ER or Urgent Care in past year: no Pneumovax: Up to Date Influenza: Up to Date  Migraines have been doing well. Tolerating medicine well. No concerns.   Relevant past medical, surgical, family and social history reviewed and updated as indicated. Interim medical history since our last visit reviewed. Allergies and medications reviewed and updated.  Review of Systems  Constitutional: Negative.   Respiratory: Negative.    Cardiovascular: Negative.   Musculoskeletal: Negative.   Psychiatric/Behavioral:  Positive for dysphoric mood. Negative for agitation, behavioral problems, confusion, decreased concentration, hallucinations, self-injury, sleep disturbance and suicidal ideas. The patient is  nervous/anxious. The patient is not hyperactive.     Per HPI unless specifically indicated above     Objective:    BP 107/72   Pulse 73   Temp 98.2 F (36.8 C) (Oral)   Ht 6\' 1"  (1.854 m)   Wt 227 lb 12.8 oz (103.3 kg)   SpO2 100%   BMI 30.05 kg/m   Wt Readings from Last 3 Encounters:  05/28/23 227 lb 12.8 oz (103.3 kg)  04/29/23 227 lb 12.8 oz (103.3 kg)  04/19/23 236 lb 9.6 oz (107.3 kg)    Physical Exam Vitals and nursing note reviewed.  Constitutional:      General: He is not in acute distress.    Appearance: Normal appearance. He is not ill-appearing, toxic-appearing or diaphoretic.  HENT:     Head: Normocephalic and atraumatic.     Right  Ear: External ear normal.     Left Ear: External ear normal.     Nose: Nose normal.     Mouth/Throat:     Mouth: Mucous membranes are moist.     Pharynx: Oropharynx is clear.  Eyes:     General: No scleral icterus.       Right eye: No discharge.        Left eye: No discharge.     Extraocular Movements: Extraocular movements intact.     Conjunctiva/sclera: Conjunctivae normal.     Pupils: Pupils are equal, round, and reactive to light.  Cardiovascular:     Rate and Rhythm: Normal rate and regular rhythm.     Pulses: Normal pulses.     Heart sounds: Normal heart sounds. No murmur heard.    No friction rub. No gallop.  Pulmonary:     Effort: Pulmonary effort is normal. No respiratory distress.     Breath sounds: Normal breath sounds. No stridor. No wheezing, rhonchi or rales.  Chest:     Chest wall: No tenderness.  Musculoskeletal:        General: Normal range of motion.     Cervical back: Normal range of motion and neck supple.  Skin:    General: Skin is warm and dry.     Capillary Refill: Capillary refill takes less than 2 seconds.     Coloration: Skin is not jaundiced or pale.     Findings: No bruising, erythema, lesion or rash.  Neurological:     General: No focal deficit present.     Mental Status: He is alert and oriented to person, place, and time. Mental status is at baseline.  Psychiatric:        Mood and Affect: Mood normal.        Behavior: Behavior normal.        Thought Content: Thought content normal.        Judgment: Judgment normal.     Results for orders placed or performed in visit on 12/02/22  Basic metabolic panel   Collection Time: 12/02/22  8:37 AM  Result Value Ref Range   Glucose 90 70 - 99 mg/dL   BUN 19 6 - 24 mg/dL   Creatinine, Ser 4.09 (H) 0.76 - 1.27 mg/dL   eGFR 66 >81 XB/JYN/8.29   BUN/Creatinine Ratio 14 9 - 20   Sodium 137 134 - 144 mmol/L   Potassium 3.7 3.5 - 5.2 mmol/L   Chloride 99 96 - 106 mmol/L   CO2 24 20 - 29 mmol/L    Calcium 9.0 8.7 - 10.2 mg/dL      Assessment &  Plan:   Problem List Items Addressed This Visit       Cardiovascular and Mediastinum   Migraine without aura and with status migrainosus, not intractable   Under good control on current regimen. Continue current regimen. Continue to monitor. Call with any concerns. Refills given.       Relevant Medications   nortriptyline (PAMELOR) 10 MG capsule     Respiratory   Asthma   Under good control on current regimen. Continue current regimen. Continue to monitor. Call with any concerns. Refills given.        Relevant Medications   albuterol (PROAIR HFA) 108 (90 Base) MCG/ACT inhaler   fluticasone-salmeterol (WIXELA INHUB) 100-50 MCG/ACT AEPB     Other   Anxiety disorder - Primary   Will check back in in 2 weeks. Call with any concerns. Continue to monitor.       Relevant Medications   nortriptyline (PAMELOR) 10 MG capsule   Depression, recurrent (HCC)   Will check back in in 2 weeks. Call with any concerns. Continue to monitor.       Relevant Medications   nortriptyline (PAMELOR) 10 MG capsule     Follow up plan: Return in about 6 months (around 11/28/2023) for physical.

## 2023-08-06 ENCOUNTER — Other Ambulatory Visit: Payer: Self-pay | Admitting: Family Medicine

## 2023-08-09 NOTE — Telephone Encounter (Signed)
 Requested medications are due for refill today.  A little too soon  Requested medications are on the active medications list.  yes  Last refill. 04/29/2023 #30 2 rf  Future visit scheduled.   yes  Notes to clinic.  New medication to this pt.    Requested Prescriptions  Pending Prescriptions Disp Refills   citalopram  (CELEXA ) 10 MG tablet [Pharmacy Med Name: CITALOPRAM  10MG  TABLETS] 30 tablet 3    Sig: TAKE 1/2 TABLET BY MOUTH EVERY DAY FOR 7 DAYS. INCREASE TO 1 PILL DAILY     Psychiatry:  Antidepressants - SSRI Passed - 08/09/2023  2:44 PM      Passed - Completed PHQ-2 or PHQ-9 in the last 360 days      Passed - Valid encounter within last 6 months    Recent Outpatient Visits           2 months ago Generalized anxiety disorder   [facility] Paige, Megan P, DO   3 months ago Generalized anxiety disorder    [facility] [place], Traer, DO

## 2023-09-03 ENCOUNTER — Other Ambulatory Visit: Payer: Self-pay

## 2023-10-08 ENCOUNTER — Ambulatory Visit: Payer: Self-pay

## 2023-10-08 NOTE — Telephone Encounter (Signed)
 FYI Only or Action Required?: Action required by provider: no PCP availability, unable to go to UC today, needs call back with further recommendations.  Patient was last seen in primary care on 05/28/2023 by Vicci Duwaine SQUIBB, DO.  Called Nurse Triage reporting Dizziness, Anxiety, Stress, pressure in head, brain fog, chest tightness with anxiety, and medication question.  Symptoms began several weeks ago.  Interventions attempted: Prescription medications: citalopram  and Rest, hydration, or home remedies.  Symptoms are: persisting, ebbs and flows.  Triage Disposition: See Physician Within 24 Hours  Patient/caregiver understands and will follow disposition?: No, wishes to speak with PCP    Message from [facility] sent at 10/08/2023  2:32 PM EDT  Brain fog, light headed.. pressure in head - worsening   Reason for Disposition  [1] MODERATE dizziness (e.g., interferes with normal activities) AND [2] has NOT been evaluated by doctor (or NP/PA) for this  (Exception: Dizziness caused by heat exposure, sudden standing, or poor fluid intake.)  Answer Assessment - Initial Assessment Questions 1. DESCRIPTION: Describe your dizziness.     Felt like about to pass out at one point But then able to exercise ran 1.5 miles and lifted weights, just feel lightheaded just feel off Trying to make sense way I describe it 2. LIGHTHEADED: Do you feel lightheaded? (e.g., somewhat faint, woozy, weak upon standing)     Not woozy or weak upon standing 3. VERTIGO: Do you feel like either you or the room is spinning or tilting? (i.e., vertigo)     no 4. SEVERITY: How bad is it?  Do you feel like you are going to faint? Can you stand and walk?     No spells where ready to get off-balance or anything, feeling lightheaded all the time 5. ONSET:  When did the dizziness begin?     Last week or 2, felt better when off citalopram  but under a lot of stress 6. AGGRAVATING FACTORS: Does anything make it  worse? (e.g., standing, change in head position)     Not really 7. HEART RATE: Can you tell me your heart rate? How many beats in 15 seconds?  (Note: Not all patients can do this.)       81 bpm 8. CAUSE: What do you think is causing the dizziness? (e.g., decreased fluids or food, diarrhea, emotional distress, heat exposure, new medicine, sudden standing, vomiting; unknown)     Taking citalopram  for 2 months, started 5 mg, didn't like how 10 made me feel, lot of stress at work, don't know if blanking out for that Pressure in head didn't happen before citalopram  10. OTHER SYMPTOMS: Do you have any other symptoms? (e.g., fever, chest pain, vomiting, diarrhea, bleeding)       Really stiff neck anyway, tension headaches in past, ice neck 2x/day anyway, not any worse lately Chest tightness but don't know if reaction to stress, right in middle of chest, 2-3/10 discomfort, doesn't stop me in my tracks, feel it before just when I'm stressed, lasts maybe 30 min, feeling a lot of anxiety during that time No fever or SOB, diarrhea or vomiting, nausea or excessive sweating Eating and drinking okay   Advised pt be examined in next 24 hours for symptoms, no PCP availability, advised pt go to UC in meantime, pt unable to go today. Advised pt go to UC when able or go to ED if any worsening or new symptoms especially if chest tightness that is abnormal for him, feeling like about to pass out, or  SOB. Sending message to PCP office for call back with further recommendations from Dr. Vicci in meantime.  Protocols used: Dizziness - Lightheadedness-A-AH

## 2023-10-11 NOTE — Telephone Encounter (Signed)
 Returned call to patient this morning. He is wondering if this all might be stress related as he found out he is being laid off from his job, although he is feeling slightly better than he was. He does state that he is worried about being able to make the appointment I offered for this morning as his boss is not yet in the office to be able to give him the ok to leave work.   We discussed that I would scheduled him for the next available and put him on the wait list. He will call back if at any times he feels he needs to be seen sooner.

## 2023-10-12 ENCOUNTER — Ambulatory Visit: Payer: Self-pay

## 2023-10-12 NOTE — Telephone Encounter (Signed)
  FYI Only or Action Required?: FYI only for provider.  Patient was last seen in primary care on 05/28/2023 by Christopher Duwaine SQUIBB, DO.  Called Nurse Triage reporting Rectal Bleeding.  Symptoms began about a month ago.  Interventions attempted: Nothing.  Symptoms are: unchanged.  Triage Disposition: See Physician Within 24 Hours  Patient/caregiver understands and will follow disposition?: Yes--Patient states that he will go to Urgent Care for further evaluation either today or tomorrow                      Copied from CRM [id number]. Topic: Clinical - Red Word Triage >> Oct 12, 2023 11:39 AM [name] wrote: [facility] that prompted transfer to Nurse Triage: Pt has been experiencing blood in stool for about a month on and off, no other symptoms were mentioned. Reason for Disposition  MODERATE rectal bleeding (e.g., small blood clots, passing blood without stool, or toilet water turns red)  Answer Assessment - Initial Assessment Questions Patient states that he will go to Urgent Care either today or tomorrow to be evaluated He denies any other major symptoms  States he already called earlier this week with other issues that he was triaged/scheduled for At this time, patient is only complaining of some bright red rectal bleeding that has been going on for about a month now. He states that he has been under a lot of stress lately and this affects his gut health Patient is advised that the recommendation is to be seen and evaluated in the next 24 hours With no availability in the office--patient is given information about the Urgent Care in [place] and he states that he will go there either today or tomorrow to be evaluated He is also advised that if anything gets worse to go to the Emergency Room Patient verbalized understanding          1. APPEARANCE of BLOOD: What color is it? Is it passed separately, on the surface of the stool, or mixed in with the stool?       Bright Red  off and on not every time  2. AMOUNT: How much blood was passed?      Makes the water red 3. FREQUENCY: How many times has blood been passed with the stools?      At [facility] once a day 4. ONSET: When was the blood first seen in the stools? (Days or weeks)      A month 5. DIARRHEA: Is there also some diarrhea? If Yes, ask: How many diarrhea stools in the past 24 hours?      No 6. CONSTIPATION: Do you have constipation? If Yes, ask: How bad is it?     No 7. RECURRENT SYMPTOMS: Have you had blood in your stools before? If Yes, ask: When was the last time? and What happened that time?      Not sure 8. BLOOD THINNERS: Do you take any blood thinners? (e.g., aspirin, clopidogrel / Plavix, coumadin, heparin). Notes: Other strong blood thinners include: Arixtra (fondaparinux), Eliquis (apixaban), Pradaxa (dabigatran), and Xarelto (rivaroxaban).     Aspirin 81mg  everyday 9. OTHER SYMPTOMS: Do you have any other symptoms?  (e.g., abdomen pain, vomiting, dizziness, fever)     Some light headedness but he believes that is due to a medication he has already called in about  Protocols used: Rectal Bleeding-A-AH

## 2023-10-12 NOTE — ED Provider Notes (Signed)
 presents to urgent care for evaluation of rectal bleeding for 1 month.  Instructed to go to the nearest emergency department as there is no diagnostic testing or imaging available to assess this condition here in clinic.   Teresa Shelba SAUNDERS, NP 10/12/23 1241

## 2023-10-12 NOTE — Telephone Encounter (Signed)
 This along with messages from Triage fro yesterday.

## 2023-10-12 NOTE — Telephone Encounter (Signed)
 FYI Only or Action Required?: Action required by provider: request for appointment.  Patient was last seen in primary care on 05/28/2023 by Vicci Duwaine SQUIBB, DO.  Called Nurse Triage reporting Rectal Bleeding.  Symptoms began about a month ago.  Interventions attempted: Nothing.  Symptoms are: unchanged. Pt. Went to UC today due to no availability. He was told to go to ED, DECLINE. Asking to be worked in the office this week.  Triage Disposition: See Physician Within 24 Hours  Patient/caregiver understands and will follow disposition?: Yes   Copied from CRM [id number]. Topic: Clinical - Red Word Triage >> Oct 12, 2023 12:44 PM Delon DASEN wrote: Red Word that prompted transfer to Nurse Triage: bright red blood in stool, burning and discomfort- urgent care said they can't do anything, patient refused ER Reason for Disposition  MODERATE rectal bleeding (e.g., small blood clots, passing blood without stool, or toilet water turns red)  Answer Assessment - Initial Assessment Questions 1. APPEARANCE of BLOOD: What color is it? Is it passed separately, on the surface of the stool, or mixed in with the stool?      Bright red 2. AMOUNT: How much blood was passed?      Red  3. FREQUENCY: How many times has blood been passed with the stools?      often 4. ONSET: When was the blood first seen in the stools? (Days or weeks)      1 month 5. DIARRHEA: Is there also some diarrhea? If Yes, ask: How many diarrhea stools in the past 24 hours?      no 6. CONSTIPATION: Do you have constipation? If Yes, ask: How bad is it?     no 7. RECURRENT SYMPTOMS: Have you had blood in your stools before? If Yes, ask: When was the last time? and What happened that time?      no 8. BLOOD THINNERS: Do you take any blood thinners? (e.g., aspirin, clopidogrel / Plavix, coumadin, heparin). Notes: Other strong blood thinners include: Arixtra (fondaparinux), Eliquis (apixaban), Pradaxa (dabigatran),  and Xarelto (rivaroxaban).     ASA 9. OTHER SYMPTOMS: Do you have any other symptoms?  (e.g., abdomen pain, vomiting, dizziness, fever)     gas 10. PREGNANCY: Is there any chance you are pregnant? When was your last menstrual period?       N/a  Protocols used: Rectal Bleeding-A-AH

## 2023-10-14 NOTE — Telephone Encounter (Unsigned)
 Copied from CRM [id number]. Topic: Appointments - Appointment Scheduling >> Oct 14, 2023 11:31 AM Winona SAUNDERS wrote: FYI No action needed on our behalf. Pt called in to verify he was still on the wait list for an appointment. Pt previously instructed by Nurse Triage and Urgent care to go to the ED for rectal bleeding and he refuses to do so unless it gets worst. Pt also refused to speak with Triage today.

## 2023-10-15 ENCOUNTER — Ambulatory Visit: Admitting: Family Medicine

## 2023-10-20 ENCOUNTER — Encounter: Payer: Self-pay | Admitting: Pediatrics

## 2023-10-20 ENCOUNTER — Ambulatory Visit (INDEPENDENT_AMBULATORY_CARE_PROVIDER_SITE_OTHER): Admitting: Pediatrics

## 2023-10-20 VITALS — BP 127/82 | HR 77 | Temp 97.8°F

## 2023-10-20 DIAGNOSIS — K602 Anal fissure, unspecified: Secondary | ICD-10-CM | POA: Diagnosis not present

## 2023-10-20 DIAGNOSIS — F339 Major depressive disorder, recurrent, unspecified: Secondary | ICD-10-CM | POA: Diagnosis not present

## 2023-10-20 DIAGNOSIS — K649 Unspecified hemorrhoids: Secondary | ICD-10-CM

## 2023-10-20 DIAGNOSIS — R42 Dizziness and giddiness: Secondary | ICD-10-CM

## 2023-10-20 MED ORDER — HYDROXYZINE HCL 10 MG PO TABS: 5.0000 mg | ORAL_TABLET | Freq: Three times a day (TID) | ORAL | 0 refills | Status: DC | PRN

## 2023-10-20 MED ORDER — HYDROCORTISONE ACETATE 25 MG RE SUPP: 25.0000 mg | Freq: Two times a day (BID) | RECTAL | 0 refills | Status: DC

## 2023-10-20 MED ORDER — BUPROPION HCL 75 MG PO TABS: 75.0000 mg | ORAL_TABLET | Freq: Every day | ORAL | 2 refills | Status: DC

## 2023-10-20 NOTE — Progress Notes (Signed)
 Office Visit  BP 127/82   Pulse 77   Temp 97.8 F (36.6 C) (Oral)   SpO2 99%    Subjective:    Patient ID: Christopher Davenport, male    DOB: [birth year], 48 y.o.   MRN: [id number]  HPI: Christopher Davenport is a 48 y.o. male  Chief Complaint  Patient presents with   Hemorrhoids    Pt states he hasn't bled in 4 days but still is backed    Discussed the use of AI scribe software for clinical note transcription with the patient, who gave verbal consent to proceed.  History of Present Illness   Christopher Davenport is a 48 year old male who presents with concerns about medication side effects and hemorrhoids.  He is experiencing increased anxiety and depression, which he attributes to recent stressors, including the impending loss of his job due to layoffs scheduled for September 1st. He feels tense and on edge, with a sensation of high blood pressure. He is currently taking citalopram  at a reduced dose of 2.5 mg, which he has been weaning himself off due to side effects such as lightheadedness and a weird feeling. He also takes Buspar , half a tablet in the morning, which he finds helpful but is unsure if the dose is sufficient.  He has a history of trying various medications for his mental health, including Zoloft , which he did not like, and Prozac , which he took for years but discontinued due to hair loss and perceived lack of efficacy. He describes himself as very sensitive to medication and has been on low doses historically. He wants a medication that would alleviate anxiety without causing sexual side effects or hair loss.  He reports bright red blood in his stool, which he noticed daily until he stopped taking low-dose aspirin three to four days ago. He continues to experience pressure and constipation but has not used any treatments for constipation yet.  He mentions difficulty sleeping, which he attributes to stress from his job situation, causing his mind to spin at night. He has not  been using any specific sleep aids.     Relevant past medical, surgical, family and social history reviewed and updated as indicated. Interim medical history since our last visit reviewed. Allergies and medications reviewed and updated.  ROS per HPI unless specifically indicated above     Objective:    BP 127/82   Pulse 77   Temp 97.8 F (36.6 C) (Oral)   SpO2 99%   Wt Readings from Last 3 Encounters:  05/28/23 227 lb 12.8 oz (103.3 kg)  04/29/23 227 lb 12.8 oz (103.3 kg)  04/19/23 236 lb 9.6 oz (107.3 kg)     Physical Exam Constitutional:      Appearance: Normal appearance.  Pulmonary:     Effort: Pulmonary effort is normal.  Genitourinary:    Rectum: No mass, external hemorrhoid or internal hemorrhoid.     Comments: Chaperone present for  DRE. Possible fissure at area of tenderness at the 7-8 o clock position Musculoskeletal:        General: Normal range of motion.  Skin:    Comments: Normal skin color  Neurological:     General: No focal deficit present.     Mental Status: He is alert. Mental status is at baseline.  Psychiatric:        Mood and Affect: Mood normal.        Behavior: Behavior normal.        Thought  Content: Thought content normal.         10/20/2023    2:57 PM 05/28/2023    9:35 AM 04/29/2023    9:36 AM 04/19/2023    8:44 AM 02/25/2023    1:46 PM  Depression screen PHQ 2/9  Decreased Interest 1 1 1 1  0  Down, Depressed, Hopeless 2 1 1 1 1   PHQ - 2 Score 3 2 2 2 1   Altered sleeping 3 2 1 3 1   Tired, decreased energy 3 2 1 3 1   Change in appetite 1 1 1 3  0  Feeling bad or failure about yourself  1 0 0 1 0  Trouble concentrating 3 1 2 3 1   Moving slowly or fidgety/restless 2 0 1 1 0  Suicidal thoughts 0 0 0 0 0  PHQ-9 Score 16 8 8 16 4   Difficult doing work/chores Very difficult Somewhat difficult Somewhat difficult Somewhat difficult Somewhat difficult       10/20/2023    2:58 PM 05/28/2023    9:36 AM 04/29/2023    9:36 AM 04/19/2023    8:44 AM   GAD 7 : Generalized Anxiety Score  Nervous, Anxious, on Edge 3 1 2 3   Control/stop worrying 3 0 2 3  Worry too much - different things 3 1 2 3   Trouble relaxing 0 1 2 3   Restless 2 0 2 3  Easily annoyed or irritable 0 0 2 3  Afraid - awful might happen  0 0 0  Total GAD 7 Score  3 12 18   Anxiety Difficulty Very difficult Somewhat difficult Somewhat difficult Somewhat difficult       Assessment & Plan:  Assessment & Plan   Depression, recurrent (HCC) Assessment & Plan: Symptoms are worsened by job stress and medication side effects. Citalopram  caused lightheadedness and mental sluggishness. Previous trials of Zoloft  and Prozac  were ineffective. There is a potential ADHD component. Wellbutrin  is considered for depression and ADHD due to fewer sexual side effects and no hair loss. Although Wellbutrin  may increase anxiety, the benefits may outweigh the risks. Discontinue citalopram  (has essentially already stopped this, taking 2.5mg ). Start Wellbutrin  at 75 mg daily and monitor for increased anxiety. Prescribe hydroxyzine  as needed for anxiety and sleep. Consider a genetic swab for personalized psychiatric medication guidance.  Orders: -     buPROPion  HCl; Take 1 tablet (75 mg total) by mouth daily. Ok to take 2 if beneficial and tolerating well after 5-7 days.  Dispense: 30 tablet; Refill: 2 -     hydrOXYzine  HCl; Take 0.5-1 tablets (5-10 mg total) by mouth 3 (three) times daily as needed.  Dispense: 30 tablet; Refill: 0  Hemorrhoids, unspecified hemorrhoid type Anal fissure Bright red blood in stool is due to constipation. Bleeding resolved after stopping low-dose aspirin. A previous colonoscopy showed no hemorrhoids, indicating new symptom onset. DRE without blood, felt possible fissure internally, no hemorrhoids though limited exam given no anoscope so could have more internal hemorrhoids. No immediate examination is needed unless symptoms recur or worsen. Recommend Miralax and Senna for  constipation management. Prescribe Anusol  cream for hemorrhoid flare-ups. Advise sitz baths for relief. Perform a physical examination to assess hemorrhoids. -     Hydrocortisone  Acetate; Place 1 suppository (25 mg total) rectally 2 (two) times daily.  Dispense: 12 suppository; Refill: 0  Dizziness Unclear if related to above. Encouraged good hydration and will get blood work below. Discuss with pcp further at follow up. -     CBC with  Differential/Platelet -     Basic metabolic panel with GFR     Follow up plan: Return in about 2 weeks (around 11/03/2023) for Mood.  Hadassah SHAUNNA Nett, MD

## 2023-10-20 NOTE — Patient Instructions (Addendum)
 Start welbutrin 75mg , if going well can double up on it  I am also sending hydroxizine 10mg  as needed. Feel free to break this in half if you find it too sedating. Max 30mg  total per day (three of the 10mg  tabs).   Miralax - soften stool as needed  Senna - twice daily as needed  HEALTHY BOWEL PLAN To Help Prevent Hemorrhoids, Fissures, and other Anal Issues KEEP STOOLS SOFT AND FORMED Your goal is to avoid both very hard stools and diarrhea, while achieving a soft, bulky, easily cleaned type of stool. This type of stool seems to be the best kind to prevent anal problems of almost all kinds.  You can take Colace or Docusate 1-2 tabs once or twice each day to help keep stools soft You can also take a dose of Miralax once a day (in addition to fiber) to help you have a soft, formed bowel movement if you are constipated and straining. FIBER The best thing you can do for your bowels is increasing your dietary fiber and taking a fiber supplement. It is usually recommended to achieve 20-35 grams of fiber per day in the diet, including plenty of fruits and vegetables. Most people can benefit from taking a fiber supplement daily (eg. Metamucil, Citrucel, Benefiber or other over the counter fiber supplement). I recommend supplementing your diet with 10 grams of a supplemental fiber.  Start slow and increase to the 10 grams to avoid gas and bloating. HYDRATE Consume 64 oz of non-caffeinated fluids.  Caffeinated drinks and alcohol tend to be dehydrating and therefore do not count toward this total. EXERCISE Regular daily exercise helps prevent and help constipation. TOILET TIME Limit the amount of time you spend on the toilet to less than 5 minutes.  If you still feel the urge to go- get up, walk around, and come back to the toilet. Do not take your cell phone, laptop, or your favorite book to the bathroom with you! HYGIENE Avoid any wipes that have fragrance, alcohol or witch hazel. Use only toilet  paper moistened with water or unscented baby wipes if needed. Sitz baths are great to help with comfort and hygiene.  You can take a Sitz baths (either in your tub or with an over-the-toilet bath) 3 times each day for up to 10 minutes with warm (not hot) water.

## 2023-10-21 ENCOUNTER — Ambulatory Visit: Payer: Self-pay | Admitting: Pediatrics

## 2023-10-21 LAB — CBC WITH DIFFERENTIAL/PLATELET
Basophils Absolute: 0 x10E3/uL (ref 0.0–0.2)
Basos: 1 %
EOS (ABSOLUTE): 0.2 x10E3/uL (ref 0.0–0.4)
Eos: 3 %
Hematocrit: 45.9 % (ref 37.5–51.0)
Hemoglobin: 15.3 g/dL (ref 13.0–17.7)
Immature Grans (Abs): 0 x10E3/uL (ref 0.0–0.1)
Immature Granulocytes: 0 %
Lymphocytes Absolute: 1.7 x10E3/uL (ref 0.7–3.1)
Lymphs: 26 %
MCH: 32.8 pg (ref 26.6–33.0)
MCHC: 33.3 g/dL (ref 31.5–35.7)
MCV: 99 fL — ABNORMAL HIGH (ref 79–97)
Monocytes Absolute: 0.5 x10E3/uL (ref 0.1–0.9)
Monocytes: 7 %
Neutrophils Absolute: 4.1 x10E3/uL (ref 1.4–7.0)
Neutrophils: 63 %
Platelets: 231 x10E3/uL (ref 150–450)
RBC: 4.66 x10E6/uL (ref 4.14–5.80)
RDW: 13 % (ref 11.6–15.4)
WBC: 6.5 x10E3/uL (ref 3.4–10.8)

## 2023-10-21 LAB — BASIC METABOLIC PANEL WITH GFR
BUN/Creatinine Ratio: 11 (ref 9–20)
BUN: 15 mg/dL (ref 6–24)
CO2: 22 mmol/L (ref 20–29)
Calcium: 9.3 mg/dL (ref 8.7–10.2)
Chloride: 100 mmol/L (ref 96–106)
Creatinine, Ser: 1.42 mg/dL — ABNORMAL HIGH (ref 0.76–1.27)
Glucose: 97 mg/dL (ref 70–99)
Potassium: 4.1 mmol/L (ref 3.5–5.2)
Sodium: 139 mmol/L (ref 134–144)
eGFR: 60 mL/min/1.73 (ref >59)

## 2023-10-25 ENCOUNTER — Encounter: Payer: Self-pay | Admitting: Pediatrics

## 2023-10-25 NOTE — Assessment & Plan Note (Signed)
 Symptoms are worsened by job stress and medication side effects. Citalopram  caused lightheadedness and mental sluggishness. Previous trials of Zoloft  and Prozac  were ineffective. There is a potential ADHD component. Wellbutrin  is considered for depression and ADHD due to fewer sexual side effects and no hair loss. Although Wellbutrin  may increase anxiety, the benefits may outweigh the risks. Discontinue citalopram . Start Wellbutrin  at 75 mg daily and monitor for increased anxiety. Prescribe hydroxyzine  as needed for anxiety and sleep. Consider a genetic swab for personalized psychiatric medication guidance.

## 2023-11-03 ENCOUNTER — Encounter: Payer: Self-pay | Admitting: Family Medicine

## 2023-11-03 ENCOUNTER — Ambulatory Visit (INDEPENDENT_AMBULATORY_CARE_PROVIDER_SITE_OTHER): Admitting: Family Medicine

## 2023-11-03 VITALS — BP 121/78 | HR 75 | Temp 98.7°F | Resp 15 | Ht 72.99 in | Wt 224.0 lb

## 2023-11-03 DIAGNOSIS — F411 Generalized anxiety disorder: Secondary | ICD-10-CM | POA: Diagnosis not present

## 2023-11-03 DIAGNOSIS — M5431 Sciatica, right side: Secondary | ICD-10-CM

## 2023-11-03 MED ORDER — CYCLOBENZAPRINE HCL 10 MG PO TABS: 10.0000 mg | ORAL_TABLET | Freq: Every day | ORAL | 0 refills | Status: DC

## 2023-11-03 MED ORDER — ALBUTEROL SULFATE HFA 108 (90 BASE) MCG/ACT IN AERS: INHALATION_SPRAY | RESPIRATORY_TRACT | 6 refills | Status: DC

## 2023-11-03 MED ORDER — TADALAFIL 20 MG PO TABS: 10.0000 mg | ORAL_TABLET | ORAL | 11 refills | Status: DC | PRN

## 2023-11-03 MED ORDER — NORTRIPTYLINE HCL 10 MG PO CAPS: 10.0000 mg | ORAL_CAPSULE | Freq: Every day | ORAL | 1 refills | Status: DC

## 2023-11-03 MED ORDER — FLUTICASONE-SALMETEROL 100-50 MCG/ACT IN AEPB: 1.0000 | INHALATION_SPRAY | Freq: Two times a day (BID) | RESPIRATORY_TRACT | 6 refills | Status: DC

## 2023-11-03 NOTE — Progress Notes (Signed)
 BP 121/78 (BP Location: Left Arm, Patient Position: Sitting, Cuff Size: Large)   Pulse 75   Temp 98.7 F (37.1 C) (Oral)   Resp 15   Ht 6' 0.99 (1.854 m)   Wt 224 lb (101.6 kg)   SpO2 97%   BMI 29.56 kg/m    Subjective:    Patient ID: Christopher Davenport, male    DOB: 1975/06/30, 48 y.o.   MRN: [id number]  HPI: [place] RONNALD SHEDDEN is a 49 y.o. male  Chief Complaint  Patient presents with   Mood    Anxious and irritable feeling. Depressions feels much better. Feels energetic in the morning and then feels sleepy in the afternoon. Feels he may slur his words but this was there before the medication was started. Sleeping better at night but then no longer able to take afternoon/lunch nap that he prior relied on to reset himself.      Sciatica    Right sided hip into leg. Foot feel asleep. Not constant.    Hemorrhoids    Bleeding has resolved but still feels pressure.    ANXIETY/DEPRESSION Duration: chronic Status:uncontrolled Anxious mood: yes  Excessive worrying: yes Irritability: yes  Sweating: no Nausea: no Palpitations:no Hyperventilation: no Panic attacks: no Agoraphobia: no  Obscessions/compulsions: no Depressed mood: yes    11/03/2023    4:01 PM 10/20/2023    2:57 PM 05/28/2023    9:35 AM 04/29/2023    9:36 AM 04/19/2023    8:44 AM  Depression screen PHQ 2/9  Decreased Interest 1 1 1 1 1   Down, Depressed, Hopeless 1 2 1 1 1   PHQ - 2 Score 2 3 2 2 2   Altered sleeping 2 3 2 1 3   Tired, decreased energy 1 3 2 1 3   Change in appetite 0 1 1 1 3   Feeling bad or failure about yourself  1 1 0 0 1  Trouble concentrating 1 3 1 2 3   Moving slowly or fidgety/restless 1 2 0 1 1  Suicidal thoughts 0 0 0 0 0  PHQ-9 Score 8 16 8 8 16   Difficult doing work/chores Somewhat difficult Very difficult Somewhat difficult Somewhat difficult Somewhat difficult      11/03/2023    4:01 PM 10/20/2023    2:58 PM 05/28/2023    9:36 AM 04/29/2023    9:36 AM  GAD 7 : Generalized Anxiety Score   Nervous, Anxious, on Edge 3 3 1 2   Control/stop worrying 1 3 0 2  Worry too much - different things 1 3 1 2   Trouble relaxing 3 0 1 2  Restless  2 0 2  Easily annoyed or irritable 3 0 0 2  Afraid - awful might happen 1  0 0  Total GAD 7 Score   3 12  Anxiety Difficulty Somewhat difficult Very difficult Somewhat difficult Somewhat difficult   Anhedonia: no Weight changes: no Insomnia: no   Hypersomnia: no Fatigue/loss of energy: yes Feelings of worthlessness: no Feelings of guilt: no Impaired concentration/indecisiveness: no Suicidal ideations: no  Crying spells: no Recent Stressors/Life Changes: no   Relationship problems: no   Family stress: no     Financial stress: no    Job stress: no    Recent death/loss: no  BACK PAIN Duration: yesterday Mechanism of injury: unknown Location: Right and low back Onset: sudden Severity: mild Quality: numb and tingling Frequency: intermittent Radiation: R leg down to his foot Aggravating factors: happened when he was laying down Alleviating factors:  moving around Status: better Treatments attempted: stretching, moving around  Relief with NSAIDs?: No NSAIDs Taken Nighttime pain:  no Paresthesias / decreased sensation:  yes Bowel / bladder incontinence:  no Fevers:  no Dysuria / urinary frequency:  no   Relevant past medical, surgical, family and social history reviewed and updated as indicated. Interim medical history since our last visit reviewed. Allergies and medications reviewed and updated.  Review of Systems  Constitutional: Negative.   Respiratory: Negative.    Cardiovascular: Negative.   Musculoskeletal: Negative.   Psychiatric/Behavioral:  Positive for agitation and dysphoric mood. Negative for behavioral problems, confusion, decreased concentration, hallucinations, self-injury, sleep disturbance and suicidal ideas. The patient is nervous/anxious. The patient is not hyperactive.     Per HPI unless specifically  indicated above     Objective:    BP 121/78 (BP Location: Left Arm, Patient Position: Sitting, Cuff Size: Large)   Pulse 75   Temp 98.7 F (37.1 C) (Oral)   Resp 15   Ht 6' 0.99 (1.854 m)   Wt 224 lb (101.6 kg)   SpO2 97%   BMI 29.56 kg/m   Wt Readings from Last 3 Encounters:  11/03/23 224 lb (101.6 kg)  05/28/23 227 lb 12.8 oz (103.3 kg)  04/29/23 227 lb 12.8 oz (103.3 kg)    Physical Exam Vitals and nursing note reviewed.  Constitutional:      General: He is not in acute distress.    Appearance: Normal appearance. He is not ill-appearing, toxic-appearing or diaphoretic.  HENT:     Head: Normocephalic and atraumatic.     Right Ear: External ear normal.     Left Ear: External ear normal.     Nose: Nose normal.     Mouth/Throat:     Mouth: Mucous membranes are moist.     Pharynx: Oropharynx is clear.  Eyes:     General: No scleral icterus.       Right eye: No discharge.        Left eye: No discharge.     Extraocular Movements: Extraocular movements intact.     Conjunctiva/sclera: Conjunctivae normal.     Pupils: Pupils are equal, round, and reactive to light.  Cardiovascular:     Rate and Rhythm: Normal rate and regular rhythm.     Pulses: Normal pulses.     Heart sounds: Normal heart sounds. No murmur heard.    No friction rub. No gallop.  Pulmonary:     Effort: Pulmonary effort is normal. No respiratory distress.     Breath sounds: Normal breath sounds. No stridor. No wheezing, rhonchi or rales.  Chest:     Chest wall: No tenderness.  Musculoskeletal:        General: Normal range of motion.     Cervical back: Normal range of motion and neck supple.  Skin:    General: Skin is warm and dry.     Capillary Refill: Capillary refill takes less than 2 seconds.     Coloration: Skin is not jaundiced or pale.     Findings: No bruising, erythema, lesion or rash.  Neurological:     General: No focal deficit present.     Mental Status: He is alert and oriented to  person, place, and time. Mental status is at baseline.  Psychiatric:        Mood and Affect: Mood normal.        Behavior: Behavior normal.        Thought Content: Thought content normal.  Judgment: Judgment normal.     Results for orders placed or performed in visit on 10/20/23  CBC w/Diff   Collection Time: 10/20/23  3:43 PM  Result Value Ref Range   WBC 6.5 3.4 - 10.8 x10E3/uL   RBC 4.66 4.14 - 5.80 x10E6/uL   Hemoglobin 15.3 13.0 - 17.7 g/dL   Hematocrit 54.0 62.4 - 51.0 %   MCV 99 (H) 79 - 97 fL   MCH 32.8 26.6 - 33.0 pg   MCHC 33.3 31.5 - 35.7 g/dL   RDW 86.9 88.3 - 84.5 %   Platelets 231 150 - 450 x10E3/uL   Neutrophils 63 Not Estab. %   Lymphs 26 Not Estab. %   Monocytes 7 Not Estab. %   Eos 3 Not Estab. %   Basos 1 Not Estab. %   Neutrophils Absolute 4.1 1.4 - 7.0 x10E3/uL   Lymphocytes Absolute 1.7 0.7 - 3.1 x10E3/uL   Monocytes Absolute 0.5 0.1 - 0.9 x10E3/uL   EOS (ABSOLUTE) 0.2 0.0 - 0.4 x10E3/uL   Basophils Absolute 0.0 0.0 - 0.2 x10E3/uL   Immature Granulocytes 0 Not Estab. %   Immature Grans (Abs) 0.0 0.0 - 0.1 x10E3/uL  Basic Metabolic Panel (BMET)   Collection Time: 10/20/23  3:43 PM  Result Value Ref Range   Glucose 97 70 - 99 mg/dL   BUN 15 6 - 24 mg/dL   Creatinine, Ser 8.57 (H) 0.76 - 1.27 mg/dL   eGFR 60 >40 fO/fpw/8.26   BUN/Creatinine Ratio 11 9 - 20   Sodium 139 134 - 144 mmol/L   Potassium 4.1 3.5 - 5.2 mmol/L   Chloride 100 96 - 106 mmol/L   CO2 22 20 - 29 mmol/L   Calcium 9.3 8.7 - 10.2 mg/dL      Assessment & Plan:   Problem List Items Addressed This Visit       Other   Anxiety disorder - Primary   Not doing great, but better. Will try taking his wellbutrin  2x a day and recheck by mychart in about 2 weeks. Call with any concerns. Continue to monitor.      Relevant Medications   nortriptyline  (PAMELOR ) 10 MG capsule   Other Visit Diagnoses       Sciatica, right side       Will start stretches and refill his flexeril .  Check in in 2 weeks. If not getting better continue PT.   Relevant Medications   cyclobenzaprine  (FLEXERIL ) 10 MG tablet   nortriptyline  (PAMELOR ) 10 MG capsule        Follow up plan: Return in about 6 weeks (around 12/15/2023) for virtual OK.

## 2023-11-03 NOTE — Assessment & Plan Note (Signed)
 Not doing great, but better. Will try taking his wellbutrin  2x a day and recheck by mychart in about 2 weeks. Call with any concerns. Continue to monitor.

## 2023-11-09 ENCOUNTER — Encounter: Payer: Self-pay | Admitting: Family Medicine

## 2023-11-10 ENCOUNTER — Encounter: Payer: Self-pay | Admitting: Nurse Practitioner

## 2023-11-10 ENCOUNTER — Ambulatory Visit: Payer: Self-pay

## 2023-11-10 ENCOUNTER — Telehealth (INDEPENDENT_AMBULATORY_CARE_PROVIDER_SITE_OTHER): Admitting: Nurse Practitioner

## 2023-11-10 DIAGNOSIS — U071 COVID-19: Secondary | ICD-10-CM | POA: Insufficient documentation

## 2023-11-10 MED ORDER — NIRMATRELVIR/RITONAVIR (PAXLOVID)TABLET: 3.0000 | ORAL_TABLET | Freq: Two times a day (BID) | ORAL | 0 refills | Status: AC

## 2023-11-10 NOTE — Telephone Encounter (Signed)
 FYI Only or Action Required?: FYI only for provider.  Patient was last seen in primary care on 11/03/2023 by Vicci Duwaine SQUIBB, DO.  Called Nurse Triage reporting Covid Positive.  Symptoms began a couple of days ago.  Symptoms are: gradually worsening.  Triage Disposition: Call PCP Within 24 Hours  Patient/caregiver understands and will follow disposition?: Yes        Copied from CRM #[id number]. Topic: Clinical - Red Word Triage >> Nov 10, 2023  3:06 PM DeAngela L wrote: Red Word that prompted transfer to Nurse Triage: patient diagnosed with covid and asking if he could be prescribed the antiviral meds he is is high risk category for asthma  Pt num   [phone] (M)        Reason for Disposition  [1] HIGH RISK patient (e.g., weak immune system, age > 64 years, obesity with BMI 30 or higher, pregnant, chronic lung disease or other chronic medical condition) AND [2] COVID symptoms (e.g., cough, fever)  (Exceptions: Already seen by PCP and no new or worsening symptoms.)  Answer Assessment - Initial Assessment Questions 1. COVID-19 DIAGNOSIS: How do you know that you have COVID? (e.g., positive lab test or self-test, diagnosed by doctor or NP/PA, symptoms after exposure).     Home test today  2. COVID-19 EXPOSURE: Was there any known exposure to COVID before the symptoms began? CDC Definition of close contact: within 6 feet (2 meters) for a total of 15 minutes or more over a 24-hour period.      Unsure  3. ONSET: When did the COVID-19 symptoms start?      2 days ago  4. WORST SYMPTOM: What is your worst symptom? (e.g., cough, fever, shortness of breath, muscle aches)     Cough  5. COUGH: Do you have a cough? If Yes, ask: How bad is the cough?       Mild to moderate  6. FEVER: Do you have a fever? If Yes, ask: What is your temperature, how was it measured, and when did it start?     No 7. RESPIRATORY STATUS: Describe your breathing? (e.g., normal; shortness  of breath, wheezing, unable to speak)      Mild upper respiratory symptoms  8. BETTER-SAME-WORSE: Are you getting better, staying the same or getting worse compared to yesterday?  If getting worse, ask, In what way?     Worse  9. OTHER SYMPTOMS: Do you have any other symptoms?  (e.g., chills, fatigue, headache, loss of smell or taste, muscle pain, sore throat)     Stuffy nose, brain fog  10. HIGH RISK DISEASE: Do you have any chronic medical problems? (e.g., asthma, heart or lung disease, weak immune system, obesity, etc.)       Asthma  11. VACCINE: Have you had the COVID-19 vaccine? If Yes, ask: Which one, how many shots, when did you get it?       Yes, 3 shots  13. O2 SATURATION MONITOR:  Do you use an oxygen saturation monitor (pulse oximeter) at home? If Yes, ask What is your reading (oxygen level) today? What is your usual oxygen saturation reading? (e.g., 95%)       No  Protocols used: Coronavirus (COVID-19) Diagnosed or Suspected-A-AH

## 2023-11-10 NOTE — Progress Notes (Signed)
 There were no vitals taken for this visit.   Subjective:    Patient ID: Christopher Davenport, male    DOB: Nov 27, 1975, 48 y.o.   MRN: [id number]  HPI: Christopher Davenport is a 48 y.o. male  Chief Complaint  Patient presents with   Covid Positive    Patient states he started having congestion, drainage, cough, and body aches that started yesterday. States that he tested positive about 45 minutes ago.    Virtual Visit via Video Note  I connected with Christopher Davenport on 11/10/23 at  3:20 PM EDT by a video enabled telemedicine application and verified that I am speaking with the correct person using two identifiers.  Location: Patient: home Provider: work   I discussed the limitations of evaluation and management by telemedicine and the availability of in person appointments. The patient expressed understanding and agreed to proceed.  I discussed the assessment and treatment plan with the patient. The patient was provided an opportunity to ask questions and all were answered. The patient agreed with the plan and demonstrated an understanding of the instructions.   The patient was advised to call back or seek an in-person evaluation if the symptoms worsen or if the condition fails to improve as anticipated.  I provided 25 minutes of non-face-to-face time during this encounter.   Jihaad Bruschi T Termaine Roupp, NP   COVID POSITIVE Symptoms started yesterday, tested positive for Covid today. Has underlying asthma, uses inhalers. Itchy throat and runny nose yesterday. Fever: no - body aches Cough: yes Shortness of breath: no Wheezing: no Chest pain: no Chest tightness: no Chest congestion: no Nasal congestion: yes Runny nose: yes Post nasal drip: yes Sneezing: no Sore throat: itchy throat Swollen glands: no Sinus pressure: yes Headache: no Face pain: no Toothache: no Ear pain: none Ear pressure: yes bilateral Eyes red/itching:no Eye drainage/crusting: no  Vomiting: no Rash:  no Fatigue: yes Sick contacts: no Strep contacts: no  Context: stable Recurrent sinusitis: no Relief with OTC cold/cough medications: no  Treatments attempted: none    Relevant past medical, surgical, family and social history reviewed and updated as indicated. Interim medical history since our last visit reviewed. Allergies and medications reviewed and updated.  Review of Systems  Constitutional:  Positive for chills and fatigue. Negative for activity change, appetite change, diaphoresis and fever.  HENT:  Positive for congestion, postnasal drip, rhinorrhea, sinus pressure and sore throat. Negative for ear discharge, ear pain, sinus pain, sneezing and voice change.   Respiratory:  Positive for cough. Negative for chest tightness, shortness of breath and wheezing.   Cardiovascular:  Negative for chest pain, palpitations and leg swelling.  Gastrointestinal: Negative.   Neurological: Negative.   Psychiatric/Behavioral: Negative.      Per HPI unless specifically indicated above     Objective:    There were no vitals taken for this visit.  Wt Readings from Last 3 Encounters:  11/03/23 224 lb (101.6 kg)  05/28/23 227 lb 12.8 oz (103.3 kg)  04/29/23 227 lb 12.8 oz (103.3 kg)    Physical Exam Vitals and nursing note reviewed.  Constitutional:      General: He is awake. He is not in acute distress.    Appearance: He is well-developed and well-groomed. He is ill-appearing. He is not toxic-appearing.  HENT:     Head: Normocephalic.     Right Ear: Hearing normal. No drainage.     Left Ear: Hearing normal. No drainage.  Eyes:     General:  Lids are normal.        Right eye: No discharge.        Left eye: No discharge.     Conjunctiva/sclera: Conjunctivae normal.  Pulmonary:     Effort: Pulmonary effort is normal. No accessory muscle usage or respiratory distress.  Musculoskeletal:     Cervical back: Normal range of motion.  Neurological:     Mental Status: He is alert and  oriented to person, place, and time.  Psychiatric:        Mood and Affect: Mood normal.        Behavior: Behavior normal. Behavior is cooperative.        Thought Content: Thought content normal.        Judgment: Judgment normal.    Results for orders placed or performed in visit on 10/20/23  CBC w/Diff   Collection Time: 10/20/23  3:43 PM  Result Value Ref Range   WBC 6.5 3.4 - 10.8 x10E3/uL   RBC 4.66 4.14 - 5.80 x10E6/uL   Hemoglobin 15.3 13.0 - 17.7 g/dL   Hematocrit 54.0 62.4 - 51.0 %   MCV 99 (H) 79 - 97 fL   MCH 32.8 26.6 - 33.0 pg   MCHC 33.3 31.5 - 35.7 g/dL   RDW 86.9 88.3 - 84.5 %   Platelets 231 150 - 450 x10E3/uL   Neutrophils 63 Not Estab. %   Lymphs 26 Not Estab. %   Monocytes 7 Not Estab. %   Eos 3 Not Estab. %   Basos 1 Not Estab. %   Neutrophils Absolute 4.1 1.4 - 7.0 x10E3/uL   Lymphocytes Absolute 1.7 0.7 - 3.1 x10E3/uL   Monocytes Absolute 0.5 0.1 - 0.9 x10E3/uL   EOS (ABSOLUTE) 0.2 0.0 - 0.4 x10E3/uL   Basophils Absolute 0.0 0.0 - 0.2 x10E3/uL   Immature Granulocytes 0 Not Estab. %   Immature Grans (Abs) 0.0 0.0 - 0.1 x10E3/uL  Basic Metabolic Panel (BMET)   Collection Time: 10/20/23  3:43 PM  Result Value Ref Range   Glucose 97 70 - 99 mg/dL   BUN 15 6 - 24 mg/dL   Creatinine, Ser 8.57 (H) 0.76 - 1.27 mg/dL   eGFR 60 >40 fO/fpw/8.26   BUN/Creatinine Ratio 11 9 - 20   Sodium 139 134 - 144 mmol/L   Potassium 4.1 3.5 - 5.2 mmol/L   Chloride 100 96 - 106 mmol/L   CO2 22 20 - 29 mmol/L   Calcium 9.3 8.7 - 10.2 mg/dL      Assessment & Plan:   Problem List Items Addressed This Visit       Other   Lab test positive for detection of COVID-19 virus - Primary   Acute, with symptoms starting yesterday. Tested positive today.  Has underlying asthma and has taken Paxlovid  in past.  Discussed options today. Will send in Paxlovid , educated on this.  Recommend he hold Wellbutrin  until completed course of Paxlovid  -- due to possible interaction.         Follow up plan: Return if symptoms worsen or fail to improve.

## 2023-11-10 NOTE — Assessment & Plan Note (Signed)
 Acute, with symptoms starting yesterday. Tested positive today.  Has underlying asthma and has taken Paxlovid  in past.  Discussed options today. Will send in Paxlovid , educated on this.  Recommend he hold Wellbutrin  until completed course of Paxlovid  -- due to possible interaction.

## 2023-11-10 NOTE — Patient Instructions (Signed)
 COVID-19: What to Know COVID-19 is an infection caused by a virus called SARS-CoV-2. This type of virus is called a coronavirus. People with COVID-19 may: Have few to no symptoms. Have mild to moderate symptoms that affect their lungs and breathing. Get very sick. What are the causes?  COVID-19 is caused by a virus. This virus may be in the air as droplets or on surfaces. It can spread from an infected person when they cough, sneeze, speak, sing, or breathe. You may become infected if: You breathe in the infected droplets in the air. You touch an object that has the virus on it. What increases the risk? You are at risk of getting COVID-19 if you have been around someone with the infection. You may be more likely to get very sick if: You are 57 years old or older. You have certain medical conditions, such as: Heart disease. Diabetes. Long-term respiratory disease. Cancer. Pregnancy. You are immunocompromised. This means your body can't fight infections easily. You have a disability that makes it hard for you to move around, you have trouble moving, or you can't move at all. What are the signs or symptoms? People may have different symptoms from COVID-19. The symptoms can also be mild to very bad. They often show up in 5-6 days after being infected. But, they can take up to 14 days to appear. Common symptoms are: Cough. Feeling tired. New loss of taste or smell. Fever. Less common symptoms are: Sore throat. Headache. Body or muscle aches. Diarrhea. A skin rash or fingers or toes that are a different color than usual. Red or irritated eyes. Sometimes, COVID-19 does not cause symptoms. How is this diagnosed? COVID-19 can be diagnosed with tests done in the lab or at home. Fluid from your nose, mouth, or lungs will be used to check for the virus. How is this treated? Treatment for COVID-19 depends on how sick you are. Mild symptoms can be treated at home with rest, fluids, and  over-the-counter medicines. very bad symptoms may be treated in a hospital intensive care unit (ICU). If you have symptoms and are at risk of getting very sick, you may be given a medicine that fights viruses. This medicine is called an antiviral. How is this prevented? To protect yourself from COVID-19: Know your risk factors. Get vaccinated. If your body can't fight infections easily, talk to your provider about treatment to help prevent COVID-19. Stay at least about 3 feet (1 meter) away from other people. Wear mask that fits well when: You can't stay at a distance from people. You're in a place with not a lot of air flow. Try to be in open spaces with good air flow when you are in public. Wash your hands often or use an alcohol-based hand sanitizer. Cover your nose and mouth when you cough or sneeze. If you think you have COVID-19 or have been around someone who has it, stay home and away from other people as told by your provider or health officials. Where to find more information To learn more: Go to [employer] Click Health Topics. Type COVID-19 in the search box. Go to [employer].br Click Health Topics. Then click All Topics. Type COVID-19 in the search box. Get help right away if: You have trouble breathing or get short of breath. You have pain or pressure in your chest. You're feeling confused. These symptoms may be an emergency. Get help right away. Call 911. Do not wait to see if the symptoms will go away.  Do not drive yourself to the hospital. This information is not intended to replace advice given to you by your health care provider. Make sure you discuss any questions you have with your health care provider. Document Revised: 12/10/2022 Document Reviewed: 12/02/2022 Elsevier Patient Education  2025 [employer].

## 2023-11-11 NOTE — Telephone Encounter (Signed)
 Appointment completed 11/10/2023.

## 2023-11-18 ENCOUNTER — Other Ambulatory Visit: Payer: Self-pay | Admitting: Family Medicine

## 2023-11-18 DIAGNOSIS — M5431 Sciatica, right side: Secondary | ICD-10-CM

## 2023-11-19 ENCOUNTER — Ambulatory Visit
Admission: RE | Admit: 2023-11-19 | Discharge: 2023-11-19 | Disposition: A | Discharge status: Home / Self Care | Source: Ambulatory Visit | Attending: Family Medicine | Admitting: Family Medicine

## 2023-11-19 ENCOUNTER — Ambulatory Visit
Admission: RE | Admit: 2023-11-19 | Discharge: 2023-11-19 | Disposition: A | Discharge status: Home / Self Care | Attending: Family Medicine | Admitting: Family Medicine

## 2023-11-19 DIAGNOSIS — M5431 Sciatica, right side: Secondary | ICD-10-CM | POA: Diagnosis not present

## 2023-11-19 DIAGNOSIS — M5136 Other intervertebral disc degeneration, lumbar region with discogenic back pain only: Secondary | ICD-10-CM | POA: Diagnosis not present

## 2023-11-23 ENCOUNTER — Ambulatory Visit: Admitting: Family Medicine

## 2023-11-25 ENCOUNTER — Telehealth: Payer: Self-pay

## 2023-11-25 NOTE — Telephone Encounter (Signed)
 Copied from CRM [id number]. Topic: Clinical - Request for Lab/Test Order >> Nov 17, 2023  8:59 AM Annabella S wrote: Reason for CRM: Patient is asking if he can get an order for MRI please follow up with patient

## 2023-11-25 NOTE — Telephone Encounter (Signed)
 Copied from CRM [id number]. Topic: Clinical - Red Word Triage >> Nov 17, 2023  3:52 PM Fonda T wrote: [facility] that prompted transfer to Nurse Triage: Received call from patient, reports following up on previous message for requesting an MRI.  Per patient states he is having anal and sciatica pressure, tingling, and some numbness. Patient declines any leg strengthening issues, and no sexual dysfunction , and able to urinate as normal Per patient thought it was from hemorrhoids but it is not.  Patient offered to speak to nurse triage, declined, states he would prefer to speak to Dr. Vicci nurse.  Per patient states an MRI was previously mentioned by provider, but never pursued scheduling. Would now like to proceed with scheduling MRI as soon as possible.  Patient can be reached at [phone], to update and discuss further. >> Nov 18, 2023 12:21 PM DeAngela L wrote: Patient calling to check the status of the information sent to Dr Vicci yesterday and calling to see if there was a MRI order completed yet, Patient states he his feeling a the pain but feels better just a small bit   Patient is also trying to make sure he got this appointment completed quicker before his insurance status changes with his previous employer since patient is laid, he wants to make sure he has all appointment covered before 01/20/24  Pt num  [phone] (M)

## 2023-11-25 NOTE — Telephone Encounter (Signed)
 We need to wait on the x-rays to be resulted and try some PT unless he's getting significantly worse in which case can you please get him in for a sick visit- can we also see about moving up his x-ray read?

## 2023-11-26 ENCOUNTER — Ambulatory Visit: Payer: Self-pay | Admitting: Family Medicine

## 2023-11-26 DIAGNOSIS — M4726 Other spondylosis with radiculopathy, lumbar region: Secondary | ICD-10-CM

## 2023-11-26 NOTE — Telephone Encounter (Signed)
 Radiology has been contacted and requested to have imaging read ASAP.

## 2023-11-30 ENCOUNTER — Encounter: Payer: Self-pay | Admitting: Family Medicine

## 2023-11-30 ENCOUNTER — Ambulatory Visit (INDEPENDENT_AMBULATORY_CARE_PROVIDER_SITE_OTHER): Admitting: Family Medicine

## 2023-11-30 VITALS — BP 114/84 | HR 80 | Temp 97.9°F | Ht 72.0 in | Wt 221.2 lb

## 2023-11-30 DIAGNOSIS — F411 Generalized anxiety disorder: Secondary | ICD-10-CM | POA: Diagnosis not present

## 2023-11-30 DIAGNOSIS — M4726 Other spondylosis with radiculopathy, lumbar region: Secondary | ICD-10-CM

## 2023-11-30 DIAGNOSIS — F339 Major depressive disorder, recurrent, unspecified: Secondary | ICD-10-CM

## 2023-11-30 DIAGNOSIS — N401 Enlarged prostate with lower urinary tract symptoms: Secondary | ICD-10-CM

## 2023-11-30 DIAGNOSIS — J4520 Mild intermittent asthma, uncomplicated: Secondary | ICD-10-CM

## 2023-11-30 DIAGNOSIS — Z0000 Encounter for general adult medical examination without abnormal findings: Secondary | ICD-10-CM

## 2023-11-30 DIAGNOSIS — R3914 Feeling of incomplete bladder emptying: Secondary | ICD-10-CM | POA: Diagnosis not present

## 2023-11-30 DIAGNOSIS — Z23 Encounter for immunization: Secondary | ICD-10-CM

## 2023-11-30 MED ORDER — HYDROXYZINE HCL 10 MG PO TABS: 5.0000 mg | ORAL_TABLET | Freq: Three times a day (TID) | ORAL | 1 refills | Status: AC | PRN

## 2023-11-30 MED ORDER — BUPROPION HCL 75 MG PO TABS: 75.0000 mg | ORAL_TABLET | Freq: Every day | ORAL | 1 refills | Status: DC

## 2023-11-30 MED ORDER — BUSPIRONE HCL 10 MG PO TABS: ORAL_TABLET | ORAL | 1 refills | Status: AC

## 2023-11-30 MED ORDER — TADALAFIL 5 MG PO TABS: 5.0000 mg | ORAL_TABLET | Freq: Every day | ORAL | 1 refills | Status: AC

## 2023-11-30 MED ORDER — CYCLOBENZAPRINE HCL 10 MG PO TABS: 5.0000 mg | ORAL_TABLET | Freq: Every day | ORAL | 0 refills | Status: AC

## 2023-11-30 MED ORDER — FLUTICASONE-SALMETEROL 100-50 MCG/ACT IN AEPB: 1.0000 | INHALATION_SPRAY | Freq: Two times a day (BID) | RESPIRATORY_TRACT | 1 refills | Status: AC

## 2023-11-30 MED ORDER — NORTRIPTYLINE HCL 10 MG PO CAPS: 10.0000 mg | ORAL_CAPSULE | Freq: Every day | ORAL | 1 refills | Status: DC

## 2023-11-30 MED ORDER — ALBUTEROL SULFATE HFA 108 (90 BASE) MCG/ACT IN AERS: INHALATION_SPRAY | RESPIRATORY_TRACT | 1 refills | Status: AC

## 2023-11-30 MED ORDER — NAPROXEN 500 MG PO TABS: 500.0000 mg | ORAL_TABLET | Freq: Two times a day (BID) | ORAL | 0 refills | Status: AC

## 2023-11-30 NOTE — Assessment & Plan Note (Signed)
 Under good control on current regimen. Continue current regimen. Continue to monitor. Call with any concerns. Refills given. Labs drawn today.

## 2023-11-30 NOTE — Assessment & Plan Note (Signed)
 Will change his cialis  to daily. Call with any concerns. Continue to monitor.

## 2023-11-30 NOTE — Assessment & Plan Note (Signed)
 Due to see PT tomorrow. Will treat with naproxen  and flexeril . Await MRI. Call with any concerns.

## 2023-11-30 NOTE — Progress Notes (Signed)
 BP 114/84 (BP Location: Left Arm, Patient Position: Sitting)   Pulse 80   Temp 97.9 F (36.6 C)   Ht 6' (1.829 m)   Wt 221 lb 3.2 oz (100.3 kg)   SpO2 98%   BMI 30.00 kg/m    Subjective:    Patient ID: Christopher Davenport, male    DOB: 09-Apr-1975, 48 y.o.   MRN: [id number]  HPI: Christopher Davenport is a 48 y.o. male presenting on 11/30/2023 for comprehensive medical examination. Current medical complaints include:  BACK PAIN- a little better with rest, but he's been severely limiting his activity. Due to see PT tomorrow Duration: weeks Mechanism of injury: unknown Location: Right and low back Onset: sudden Severity: mild Quality: numb and tingling Frequency: intermittent Radiation: R leg down to his foot Aggravating factors: happened when he was laying down Alleviating factors: moving around Status: better Treatments attempted: stretching, moving around  Relief with NSAIDs?: No NSAIDs Taken Nighttime pain:  no Paresthesias / decreased sensation:  yes Bowel / bladder incontinence:  no Fevers:  no Dysuria / urinary frequency:  no  ANXIETY/DEPRESSION- stable, but a bit more anxious with looking for a new job. Tolerating his medicine well without issues.  Duration: chronic Status:stable Anxious mood: yes  Excessive worrying: yes Irritability: yes  Sweating: no Nausea: no Palpitations:no Hyperventilation: no Panic attacks: no Agoraphobia: no  Obscessions/compulsions: no Depressed mood: yes    11/30/2023    9:35 AM 11/03/2023    4:01 PM 10/20/2023    2:57 PM 05/28/2023    9:35 AM 04/29/2023    9:36 AM  Depression screen PHQ 2/9  Decreased Interest 1 1 1 1 1   Down, Depressed, Hopeless 1 1 2 1 1   PHQ - 2 Score 2 2 3 2 2   Altered sleeping 1 2 3 2 1   Tired, decreased energy 1 1 3 2 1   Change in appetite 0 0 1 1 1   Feeling bad or failure about yourself  1 1 1  0 0  Trouble concentrating 1 1 3 1 2   Moving slowly or fidgety/restless 1 1 2  0 1  Suicidal thoughts 0 0 0 0 0   PHQ-9 Score 7 8 16 8 8   Difficult doing work/chores Somewhat difficult Somewhat difficult Very difficult Somewhat difficult Somewhat difficult   Anhedonia: no Weight changes: no Insomnia: no   Hypersomnia: no Fatigue/loss of energy: yes Feelings of worthlessness: no Feelings of guilt: no Impaired concentration/indecisiveness: no Suicidal ideations: no  Crying spells: no Recent Stressors/Life Changes: yes   Relationship problems: no   Family stress: yes     Financial stress: yes    Job stress: yes    Recent death/loss: no  Asthma doing well. No flares. Tolerating his medicine well.   Interim Problems from his last visit: no  Depression Screen done today and results listed below:     11/30/2023    9:35 AM 11/03/2023    4:01 PM 10/20/2023    2:57 PM 05/28/2023    9:35 AM 04/29/2023    9:36 AM  Depression screen PHQ 2/9  Decreased Interest 1 1 1 1 1   Down, Depressed, Hopeless 1 1 2 1 1   PHQ - 2 Score 2 2 3 2 2   Altered sleeping 1 2 3 2 1   Tired, decreased energy 1 1 3 2 1   Change in appetite 0 0 1 1 1   Feeling bad or failure about yourself  1 1 1  0 0  Trouble concentrating 1 1  3 1 2   Moving slowly or fidgety/restless 1 1 2  0 1  Suicidal thoughts 0 0 0 0 0  PHQ-9 Score 7 8 16 8 8   Difficult doing work/chores Somewhat difficult Somewhat difficult Very difficult Somewhat difficult Somewhat difficult    Past Medical History:  Past Medical History:  Diagnosis Date   Anxiety    Asthma    GERD (gastroesophageal reflux disease)     Surgical History:  Past Surgical History:  Procedure Laterality Date   COLONOSCOPY WITH PROPOFOL  N/A 03/19/2022   Procedure: COLONOSCOPY WITH PROPOFOL ;  Surgeon: Therisa Bi, MD;  Location: [facility] ENDOSCOPY;  Service: Gastroenterology;  Laterality: N/A;    Medications:  Current Outpatient Medications on File Prior to Visit  Medication Sig   Albuterol -Budesonide  (AIRSUPRA ) 90-80 MCG/ACT AERO Inhale 1-2 puffs into the lungs every 4 (four) hours  as needed.   b complex vitamins tablet Take 1 tablet by mouth daily.   CREATINE MONOHYDRATE PO Take by mouth.   hydrocortisone  (ANUSOL -HC) 25 MG suppository Place 1 suppository (25 mg total) rectally 2 (two) times daily.   Multiple Vitamin (MULTIVITAMIN WITH MINERALS) TABS tablet Take 1 tablet by mouth daily.   triamcinolone  ointment (KENALOG ) 0.5 % Apply 1 Application topically 2 (two) times daily.   vitamin C (ASCORBIC ACID) 500 MG tablet Take 500 mg by mouth daily.   No current facility-administered medications on file prior to visit.    Allergies:  Allergies  Allergen Reactions   Anoro Ellipta  [Umeclidinium-Vilanterol] Diarrhea and Other (See Comments)    achy all over    Social History:  Social History   Socioeconomic History   Marital status: Married    Spouse name: Not on file   Number of children: Not on file   Years of education: Not on file   Highest education level: Master's degree (e.g., MA, MS, MEng, MEd, MSW, MBA)  Occupational History   Not on file  Tobacco Use   Smoking status: Former   Smokeless tobacco: Former    Types: Associate Professor status: Never Used  Substance and Sexual Activity   Alcohol use: Yes    Comment: on the weekends   Drug use: No   Sexual activity: Yes  Other Topics Concern   Not on file  Social History Narrative   Not on file   Social Drivers of Health   Financial Resource Strain: Low Risk  (10/19/2023)   Overall Financial Resource Strain (CARDIA)    Difficulty of Paying Living Expenses: Not very hard  Food Insecurity: No Food Insecurity (10/19/2023)   Hunger Vital Sign    Worried About Running Out of Food in the Last Year: Never true    Ran Out of Food in the Last Year: Never true  Transportation Needs: No Transportation Needs (10/19/2023)   PRAPARE - Administrator, Civil Service (Medical): No    Lack of Transportation (Non-Medical): No  Physical Activity: Sufficiently Active (10/19/2023)   Exercise  Vital Sign    Days of Exercise per Week: 3 days    Minutes of Exercise per Session: 60 min  Stress: Stress Concern Present (10/19/2023)   [employer] of Occupational Health - Occupational Stress Questionnaire    Feeling of Stress: Very much  Social Connections: Socially Isolated (10/19/2023)   Social Connection and Isolation Panel    Frequency of Communication with Friends and Family: Never    Frequency of Social Gatherings with Friends and Family: Never    Attends  Religious Services: Never    Active Member of Clubs or Organizations: No    Attends Engineer, structural: Not on file    Marital Status: Married  Catering manager Violence: Not on file   Social History   Tobacco Use  Smoking Status Former  Smokeless Tobacco Former   Types: Sports administrator   Social History   Substance and Sexual Activity  Alcohol Use Yes   Comment: on the weekends    Family History:  Family History  Problem Relation Age of Onset   Diabetes Mother    Kidney disease Mother    Heart disease Mother    Hypertension Mother    Hyperlipidemia Mother    Cancer Father        lung   Lung cancer Father    Dementia Maternal Grandmother    Diabetes Maternal Grandfather    Diabetes Paternal Grandmother     Past medical history, surgical history, medications, allergies, family history and social history reviewed with patient today and changes made to appropriate areas of the chart.   Review of Systems  Constitutional: Negative.   HENT: Negative.    Eyes: Negative.   Respiratory: Negative.    Cardiovascular: Negative.   Gastrointestinal: Negative.   Genitourinary: Negative.        Incomplete bladder emptying  Musculoskeletal:  Positive for back pain and myalgias. Negative for falls, joint pain and neck pain.  Skin: Negative.   Neurological:  Positive for tingling. Negative for dizziness, tremors, sensory change, speech change, focal weakness, seizures, loss of consciousness, weakness and  headaches.  Endo/Heme/Allergies: Negative.   Psychiatric/Behavioral:  Positive for depression. Negative for hallucinations, memory loss, substance abuse and suicidal ideas. The patient is nervous/anxious. The patient does not have insomnia.    All other ROS negative except what is listed above and in the HPI.      Objective:    BP 114/84 (BP Location: Left Arm, Patient Position: Sitting)   Pulse 80   Temp 97.9 F (36.6 C)   Ht 6' (1.829 m)   Wt 221 lb 3.2 oz (100.3 kg)   SpO2 98%   BMI 30.00 kg/m   Wt Readings from Last 3 Encounters:  11/30/23 221 lb 3.2 oz (100.3 kg)  11/03/23 224 lb (101.6 kg)  05/28/23 227 lb 12.8 oz (103.3 kg)    Physical Exam Vitals and nursing note reviewed.  Constitutional:      General: He is not in acute distress.    Appearance: Normal appearance. He is not ill-appearing, toxic-appearing or diaphoretic.  HENT:     Head: Normocephalic and atraumatic.     Right Ear: Tympanic membrane, ear canal and external ear normal. There is no impacted cerumen.     Left Ear: Tympanic membrane, ear canal and external ear normal. There is no impacted cerumen.     Nose: Nose normal. No congestion or rhinorrhea.     Mouth/Throat:     Mouth: Mucous membranes are moist.     Pharynx: Oropharynx is clear. No oropharyngeal exudate or posterior oropharyngeal erythema.  Eyes:     General: No scleral icterus.       Right eye: No discharge.        Left eye: No discharge.     Extraocular Movements: Extraocular movements intact.     Conjunctiva/sclera: Conjunctivae normal.     Pupils: Pupils are equal, round, and reactive to light.  Neck:     Vascular: No carotid bruit.  Cardiovascular:  Rate and Rhythm: Normal rate and regular rhythm.     Pulses: Normal pulses.     Heart sounds: No murmur heard.    No friction rub. No gallop.  Pulmonary:     Effort: Pulmonary effort is normal. No respiratory distress.     Breath sounds: Normal breath sounds. No stridor. No  wheezing, rhonchi or rales.  Chest:     Chest wall: No tenderness.  Abdominal:     General: Abdomen is flat. Bowel sounds are normal. There is no distension.     Palpations: Abdomen is soft. There is no mass.     Tenderness: There is no abdominal tenderness. There is no right CVA tenderness, left CVA tenderness, guarding or rebound.     Hernia: No hernia is present.  Genitourinary:    Comments: Genital exam deferred with shared decision making Musculoskeletal:        General: No swelling, tenderness, deformity or signs of injury.     Cervical back: Normal range of motion and neck supple. No rigidity. No muscular tenderness.     Right lower leg: No edema.     Left lower leg: No edema.  Lymphadenopathy:     Cervical: No cervical adenopathy.  Skin:    General: Skin is warm and dry.     Capillary Refill: Capillary refill takes less than 2 seconds.     Coloration: Skin is not jaundiced or pale.     Findings: No bruising, erythema, lesion or rash.  Neurological:     General: No focal deficit present.     Mental Status: He is alert and oriented to person, place, and time.     Cranial Nerves: No cranial nerve deficit.     Sensory: No sensory deficit.     Motor: No weakness.     Coordination: Coordination normal.     Gait: Gait normal.     Deep Tendon Reflexes: Reflexes normal.  Psychiatric:        Mood and Affect: Mood normal.        Behavior: Behavior normal.        Thought Content: Thought content normal.        Judgment: Judgment normal.     Results for orders placed or performed in visit on 10/20/23  CBC w/Diff   Collection Time: 10/20/23  3:43 PM  Result Value Ref Range   WBC 6.5 3.4 - 10.8 x10E3/uL   RBC 4.66 4.14 - 5.80 x10E6/uL   Hemoglobin 15.3 13.0 - 17.7 g/dL   Hematocrit 54.0 62.4 - 51.0 %   MCV 99 (H) 79 - 97 fL   MCH 32.8 26.6 - 33.0 pg   MCHC 33.3 31.5 - 35.7 g/dL   RDW 86.9 88.3 - 84.5 %   Platelets 231 150 - 450 x10E3/uL   Neutrophils 63 Not Estab. %    Lymphs 26 Not Estab. %   Monocytes 7 Not Estab. %   Eos 3 Not Estab. %   Basos 1 Not Estab. %   Neutrophils Absolute 4.1 1.4 - 7.0 x10E3/uL   Lymphocytes Absolute 1.7 0.7 - 3.1 x10E3/uL   Monocytes Absolute 0.5 0.1 - 0.9 x10E3/uL   EOS (ABSOLUTE) 0.2 0.0 - 0.4 x10E3/uL   Basophils Absolute 0.0 0.0 - 0.2 x10E3/uL   Immature Granulocytes 0 Not Estab. %   Immature Grans (Abs) 0.0 0.0 - 0.1 x10E3/uL  Basic Metabolic Panel (BMET)   Collection Time: 10/20/23  3:43 PM  Result Value Ref Range   Glucose  97 70 - 99 mg/dL   BUN 15 6 - 24 mg/dL   Creatinine, Ser 8.57 (H) 0.76 - 1.27 mg/dL   eGFR 60 >40 fO/fpw/8.26   BUN/Creatinine Ratio 11 9 - 20   Sodium 139 134 - 144 mmol/L   Potassium 4.1 3.5 - 5.2 mmol/L   Chloride 100 96 - 106 mmol/L   CO2 22 20 - 29 mmol/L   Calcium 9.3 8.7 - 10.2 mg/dL      Assessment & Plan:   Problem List Items Addressed This Visit       Respiratory   Asthma   Under good control on current regimen. Continue current regimen. Continue to monitor. Call with any concerns. Refills given. Labs drawn today.        Relevant Medications   albuterol  (PROAIR  HFA) 108 (90 Base) MCG/ACT inhaler   fluticasone -salmeterol (WIXELA INHUB) 100-50 MCG/ACT AEPB   Other Relevant Orders   Pneumococcal conjugate vaccine 20-valent (Prevnar 20) (Completed)     Nervous and Auditory   Osteoarthritis of spine with radiculopathy, lumbar region   Due to see PT tomorrow. Will treat with naproxen  and flexeril . Await MRI. Call with any concerns.       Relevant Medications   buPROPion  (WELLBUTRIN ) 75 MG tablet   busPIRone  (BUSPAR ) 10 MG tablet   hydrOXYzine  (ATARAX ) 10 MG tablet   nortriptyline  (PAMELOR ) 10 MG capsule   naproxen  (NAPROSYN ) 500 MG tablet   cyclobenzaprine  (FLEXERIL ) 10 MG tablet     Genitourinary   Benign prostatic hyperplasia with incomplete bladder emptying   Will change his cialis  to daily. Call with any concerns. Continue to monitor.         Other    Anxiety disorder   Under good control on current regimen. Continue current regimen. Continue to monitor. Call with any concerns. Refills given. Labs drawn today.       Relevant Medications   buPROPion  (WELLBUTRIN ) 75 MG tablet   busPIRone  (BUSPAR ) 10 MG tablet   hydrOXYzine  (ATARAX ) 10 MG tablet   nortriptyline  (PAMELOR ) 10 MG capsule   Depression, recurrent (HCC)   Under good control on current regimen. Continue current regimen. Continue to monitor. Call with any concerns. Refills given. Labs drawn today.       Relevant Medications   buPROPion  (WELLBUTRIN ) 75 MG tablet   busPIRone  (BUSPAR ) 10 MG tablet   hydrOXYzine  (ATARAX ) 10 MG tablet   nortriptyline  (PAMELOR ) 10 MG capsule   Other Visit Diagnoses       Routine general medical examination at a health care facility    -  Primary   Vaccines up to date/declined. Screening labs checked today. Colonoscopy up to date. Continue diet and exercise. Call with any concerns.   Relevant Orders   Comprehensive metabolic panel with GFR   CBC with Differential/Platelet   Lipid Panel w/o Chol/HDL Ratio   PSA   TSH   Hepatitis B surface antibody,quantitative   VITAMIN D  25 Hydroxy (Vit-D Deficiency, Fractures)     Needs flu shot       Will come back for it in a couple of weeks. Ordered today.   Relevant Orders   Flu vaccine trivalent PF, 6mos and older(Flulaval,Afluria,Fluarix,Fluzone)        LABORATORY TESTING:  Health maintenance labs ordered today as discussed above.   The natural history of prostate cancer and ongoing controversy regarding screening and potential treatment outcomes of prostate cancer has been discussed with the patient. The meaning of a false positive PSA and  a false negative PSA has been discussed. He indicates understanding of the limitations of this screening test and wishes  to proceed with screening PSA testing.   IMMUNIZATIONS:   - Tdap: Tetanus vaccination status reviewed: last tetanus booster within 10  years. - Influenza: Postponed to flu season - Pneumovax: Up to date - Prevnar: Up to date - COVID: Refused - HPV: Not applicable - Shingrix vaccine: Refused  SCREENING: - Colonoscopy: Up to date  Discussed with patient purpose of the colonoscopy is to detect colon cancer at curable precancerous or early stages   PATIENT COUNSELING:    Sexuality: Discussed sexually transmitted diseases, partner selection, use of condoms, avoidance of unintended pregnancy  and contraceptive alternatives.   Advised to avoid cigarette smoking.  I discussed with the patient that most people either abstain from alcohol or drink within safe limits (<=14/week and <=4 drinks/occasion for males, <=7/weeks and <= 3 drinks/occasion for females) and that the risk for alcohol disorders and other health effects rises proportionally with the number of drinks per week and how often a drinker exceeds daily limits.  Discussed cessation/primary prevention of drug use and availability of treatment for abuse.   Diet: Encouraged to adjust caloric intake to maintain  or achieve ideal body weight, to reduce intake of dietary saturated fat and total fat, to limit sodium intake by avoiding high sodium foods and not adding table salt, and to maintain adequate dietary potassium and calcium preferably from fresh fruits, vegetables, and low-fat dairy products.    stressed the importance of regular exercise  Injury prevention: Discussed safety belts, safety helmets, smoke detector, smoking near bedding or upholstery.   Dental health: Discussed importance of regular tooth brushing, flossing, and dental visits.   Follow up plan: NEXT PREVENTATIVE PHYSICAL DUE IN 1 YEAR. Return in about 5 weeks (around 01/04/2024) for OK to cancel appt 9/24.

## 2023-12-01 DIAGNOSIS — R293 Abnormal posture: Secondary | ICD-10-CM | POA: Diagnosis not present

## 2023-12-01 DIAGNOSIS — M5459 Other low back pain: Secondary | ICD-10-CM | POA: Diagnosis not present

## 2023-12-01 LAB — LIPID PANEL W/O CHOL/HDL RATIO
Cholesterol, Total: 249 mg/dL — ABNORMAL HIGH (ref 100–199)
HDL: 33 mg/dL — ABNORMAL LOW (ref >39)
LDL Chol Calc (NIH): 91 mg/dL (ref 0–99)
Triglycerides: 751 mg/dL (ref 0–149)
VLDL Cholesterol Cal: 125 mg/dL — ABNORMAL HIGH (ref 5–40)

## 2023-12-01 LAB — COMPREHENSIVE METABOLIC PANEL WITH GFR
ALT: 28 IU/L (ref 0–44)
AST: 22 IU/L (ref 0–40)
Albumin: 4.2 g/dL (ref 4.1–5.1)
Alkaline Phosphatase: 73 IU/L (ref 44–121)
BUN/Creatinine Ratio: 16 (ref 9–20)
BUN: 17 mg/dL (ref 6–24)
Bilirubin Total: 0.4 mg/dL (ref 0.0–1.2)
CO2: 24 mmol/L (ref 20–29)
Calcium: 9.2 mg/dL (ref 8.7–10.2)
Chloride: 99 mmol/L (ref 96–106)
Creatinine, Ser: 1.08 mg/dL (ref 0.76–1.27)
Globulin, Total: 2.4 g/dL (ref 1.5–4.5)
Glucose: 96 mg/dL (ref 70–99)
Potassium: 4.1 mmol/L (ref 3.5–5.2)
Sodium: 140 mmol/L (ref 134–144)
Total Protein: 6.6 g/dL (ref 6.0–8.5)
eGFR: 84 mL/min/1.73 (ref >59)

## 2023-12-01 LAB — CBC WITH DIFFERENTIAL/PLATELET
Basophils Absolute: 0.1 x10E3/uL (ref 0.0–0.2)
Basos: 1 %
EOS (ABSOLUTE): 0.7 x10E3/uL — ABNORMAL HIGH (ref 0.0–0.4)
Eos: 12 %
Hematocrit: 43.8 % (ref 37.5–51.0)
Hemoglobin: 14.6 g/dL (ref 13.0–17.7)
Immature Grans (Abs): 0 x10E3/uL (ref 0.0–0.1)
Immature Granulocytes: 0 %
Lymphocytes Absolute: 2 x10E3/uL (ref 0.7–3.1)
Lymphs: 32 %
MCH: 32.4 pg (ref 26.6–33.0)
MCHC: 33.3 g/dL (ref 31.5–35.7)
MCV: 97 fL (ref 79–97)
Monocytes Absolute: 0.5 x10E3/uL (ref 0.1–0.9)
Monocytes: 8 %
Neutrophils Absolute: 2.9 x10E3/uL (ref 1.4–7.0)
Neutrophils: 47 %
Platelets: 208 x10E3/uL (ref 150–450)
RBC: 4.5 x10E6/uL (ref 4.14–5.80)
RDW: 12.8 % (ref 11.6–15.4)
WBC: 6.1 x10E3/uL (ref 3.4–10.8)

## 2023-12-01 LAB — PSA: Prostate Specific Ag, Serum: 0.5 ng/mL (ref 0.0–4.0)

## 2023-12-01 LAB — VITAMIN D 25 HYDROXY (VIT D DEFICIENCY, FRACTURES): Vit D, 25-Hydroxy: 23.2 ng/mL — ABNORMAL LOW (ref 30.0–100.0)

## 2023-12-01 LAB — TSH: TSH: 4.94 u[IU]/mL — ABNORMAL HIGH (ref 0.450–4.500)

## 2023-12-01 LAB — HEPATITIS B SURFACE ANTIBODY, QUANTITATIVE: Hepatitis B Surf Ab Quant: <3.5 m[IU]/mL — ABNORMAL LOW

## 2023-12-02 ENCOUNTER — Telehealth: Payer: Self-pay

## 2023-12-02 MED ORDER — ALPRAZOLAM 0.5 MG PO TABS: 0.5000 mg | ORAL_TABLET | Freq: Once | ORAL | 0 refills | Status: AC

## 2023-12-02 NOTE — Telephone Encounter (Signed)
 Copied from CRM [id number]. Topic: Clinical - Medication Question >> Dec 02, 2023  1:04 PM [name] wrote: Reason for CRM: Patient called.. says having MRI on Monday..needs a sedative due to his claustrophobia. one pill?  Just for Monday.SABRA

## 2023-12-02 NOTE — Telephone Encounter (Signed)
 Rx sent to his pharmacy

## 2023-12-03 ENCOUNTER — Telehealth: Admitting: Physician Assistant

## 2023-12-03 ENCOUNTER — Telehealth: Payer: Self-pay

## 2023-12-03 DIAGNOSIS — E781 Pure hyperglyceridemia: Secondary | ICD-10-CM | POA: Diagnosis not present

## 2023-12-03 DIAGNOSIS — R293 Abnormal posture: Secondary | ICD-10-CM | POA: Diagnosis not present

## 2023-12-03 DIAGNOSIS — M5459 Other low back pain: Secondary | ICD-10-CM | POA: Diagnosis not present

## 2023-12-03 MED ORDER — OMEGA-3-ACID ETHYL ESTERS 1 G PO CAPS: 1.0000 g | ORAL_CAPSULE | Freq: Two times a day (BID) | ORAL | 0 refills | Status: AC

## 2023-12-03 NOTE — Telephone Encounter (Signed)
 We should be advising patient that any request for prescriptions require 72 business hours.

## 2023-12-03 NOTE — Patient Instructions (Signed)
 Christopher Davenport, thank you for joining Christopher CHRISTELLA Dickinson, PA-C for today's virtual visit.  While this provider is not your primary care provider (PCP), if your PCP is located in our provider database this encounter information will be shared with them immediately following your visit.   A Christopher Davenport account gives you access to today's visit and all your visits, tests, and labs performed at [facility]  click here if you don't have a Christopher Davenport account or go to [email or web address]  Consent: (Patient) Christopher Davenport provided verbal consent for this virtual visit at the beginning of the encounter.  Current Medications:  Current Outpatient Medications:    omega-3 acid ethyl esters (LOVAZA ) 1 g capsule, Take 1 capsule (1 g total) by mouth 2 (two) times daily., Disp: 60 capsule, Rfl: 0   albuterol  (PROAIR  HFA) 108 (90 Base) MCG/ACT inhaler, inhale 2 puffs by mouth every 4 to 6 hours if needed for SHORTNESS OF BREATH, Disp: 48 g, Rfl: 1   Albuterol -Budesonide  (AIRSUPRA ) 90-80 MCG/ACT AERO, Inhale 1-2 puffs into the lungs every 4 (four) hours as needed., Disp: 10.7 g, Rfl: 12   b complex vitamins tablet, Take 1 tablet by mouth daily., Disp: , Rfl:    buPROPion  (WELLBUTRIN ) 75 MG tablet, Take 1 tablet (75 mg total) by mouth daily., Disp: 90 tablet, Rfl: 1   busPIRone  (BUSPAR ) 10 MG tablet, TAKE 1 TO 1 1/2 TABLETS(10 TO 15 MG) BY MOUTH THREE TIMES DAILY, Disp: 135 tablet, Rfl: 1   CREATINE MONOHYDRATE PO, Take by mouth., Disp: , Rfl:    cyclobenzaprine  (FLEXERIL ) 10 MG tablet, Take 0.5-1 tablets (5-10 mg total) by mouth at bedtime., Disp: 90 tablet, Rfl: 0   fluticasone -salmeterol (WIXELA INHUB) 100-50 MCG/ACT AEPB, Inhale 1 puff into the lungs 2 (two) times daily., Disp: 180 each, Rfl: 1   hydrocortisone  (ANUSOL -HC) 25 MG suppository, Place 1 suppository (25 mg total) rectally 2 (two) times daily., Disp: 12 suppository, Rfl: 0   hydrOXYzine  (ATARAX ) 10 MG  tablet, Take 0.5-1 tablets (5-10 mg total) by mouth 3 (three) times daily as needed., Disp: 270 tablet, Rfl: 1   Multiple Vitamin (MULTIVITAMIN WITH MINERALS) TABS tablet, Take 1 tablet by mouth daily., Disp: , Rfl:    naproxen  (NAPROSYN ) 500 MG tablet, Take 1 tablet (500 mg total) by mouth 2 (two) times daily with a meal., Disp: 180 tablet, Rfl: 0   nortriptyline  (PAMELOR ) 10 MG capsule, Take 1 capsule (10 mg total) by mouth at bedtime., Disp: 90 capsule, Rfl: 1   tadalafil  (CIALIS ) 5 MG tablet, Take 1 tablet (5 mg total) by mouth daily., Disp: 90 tablet, Rfl: 1   triamcinolone  ointment (KENALOG ) 0.5 %, Apply 1 Application topically 2 (two) times daily., Disp: 30 g, Rfl: 0   vitamin C (ASCORBIC ACID) 500 MG tablet, Take 500 mg by mouth daily., Disp: , Rfl:    Medications ordered in this encounter:  Meds ordered this encounter  Medications   omega-3 acid ethyl esters (LOVAZA ) 1 g capsule    Sig: Take 1 capsule (1 g total) by mouth 2 (two) times daily.    Dispense:  60 capsule    Refill:  0    Supervising Provider:   BLAISE ALEENE Davenport [[id number]]     *If you need refills on other medications prior to your next appointment, please contact your pharmacy*  Follow-Up: Call back or seek an in-person evaluation if the symptoms worsen or if the condition fails to improve as anticipated.  [facility] [phone]  Other Instructions  Managing Hyperlipidemia After viewing this video, you will be able to describe how our body uses cholesterol, and the diagnosis and treatment of hyperlipidemia. To view the content, go to this web address: [email or web address]  This video will expire on: 03/03/2025. If you need access to this video following this date, please reach out to the healthcare provider who assigned it to you. This information is not intended to replace advice given to you by your health care provider. Make sure you discuss any questions you have with your health  care provider. Elsevier Patient Education  2024 Elsevier Inc.   If you have been instructed to have an in-person evaluation today at a local Urgent Care facility, please use the link below. It will take you to a list of all of our available [facility], including address, phone number and hours of operation. Please do not delay care.  [facility]  If you or a family member do not have a primary care provider, use the link below to schedule a visit and establish care. When you choose a [facility] primary care physician or advanced practice provider, you gain a long-term partner in health. Find a Primary Care Provider  Learn more about [facility] in-office and virtual care options: [facility] Get Care Now

## 2023-12-03 NOTE — Telephone Encounter (Signed)
 Copied from CRM #[id number]. Topic: Clinical - Medication Question >> Dec 03, 2023  9:16 AM Carlatta H wrote: Reason for CRM: The patient would like to know if he could take prescription fish oil pills instead of being put on any other medication//Please call to advise

## 2023-12-03 NOTE — Telephone Encounter (Signed)
 Forwarding to PCP for review.

## 2023-12-03 NOTE — Telephone Encounter (Signed)
 Pt called and is following up about a rx for fish oil. Please give pt a call regarding this.  His tryglcerides is in the 700's and he is deeply concerned. He would like to follow up with a call.

## 2023-12-03 NOTE — Progress Notes (Signed)
 Virtual Visit Consent   Christopher Davenport, you are scheduled for a virtual visit with a [facility] provider today. Just as with appointments in the office, your consent must be obtained to participate. Your consent will be active for this visit and any virtual visit you may have with one of our providers in the next 365 days. If you have a MyChart account, a copy of this consent can be sent to you electronically.  As this is a virtual visit, video technology does not allow for your provider to perform a traditional examination. This may limit your provider's ability to fully assess your condition. If your provider identifies any concerns that need to be evaluated in person or the need to arrange testing (such as labs, EKG, etc.), we will make arrangements to do so. Although advances in technology are sophisticated, we cannot ensure that it will always work on either your end or our end. If the connection with a video visit is poor, the visit may have to be switched to a telephone visit. With either a video or telephone visit, we are not always able to ensure that we have a secure connection.  By engaging in this virtual visit, you consent to the provision of healthcare and authorize for your insurance to be billed (if applicable) for the services provided during this visit. Depending on your insurance coverage, you may receive a charge related to this service.  I need to obtain your verbal consent now. Are you willing to proceed with your visit today? Christopher Davenport has provided verbal consent on 12/03/2023 for a virtual visit (video or telephone). [name], PA-C  Date: 12/03/2023 7:14 PM   Virtual Visit via Video Note   I, [name], connected with  Christopher Davenport  ([id number], [birth year]) on 12/03/23 at  7:00 PM EDT by a video-enabled telemedicine application and verified that I am speaking with the correct person using two identifiers.  Location: Patient: Virtual Visit  Location Patient: Home Provider: Virtual Visit Location Provider: Home Office   I discussed the limitations of evaluation and management by telemedicine and the availability of in person appointments. The patient expressed understanding and agreed to proceed.    History of Present Illness: Christopher Davenport is a 48 y.o. who identifies as a male who was assigned male at birth, and is being seen today for follow up of labs. Had elevated triglycerides at 751. Total cholesterol was 249, HDL 33, VLDL 125, and LDL was 91.   He does not have prior history of any cardiovascular disease. There is familial history in his mother.   Problems:  Patient Active Problem List   Diagnosis Date Noted   Osteoarthritis of spine with radiculopathy, lumbar region 11/30/2023   Benign prostatic hyperplasia with incomplete bladder emptying 11/30/2023   Depression, recurrent (HCC) 04/19/2023   Erectile dysfunction 06/15/2022   Migraine without aura and with status migrainosus, not intractable 05/15/2022   Adenomatous polyp of colon 03/19/2022   Epididymal cyst 08/19/2016   Asthma 11/16/2014   Anxiety disorder 11/16/2014   Other headache syndrome 03/27/2014    Allergies:  Allergies  Allergen Reactions   Anoro Ellipta  [Umeclidinium-Vilanterol] Diarrhea and Other (See Comments)    achy all over   Medications:  Current Outpatient Medications:    omega-3 acid ethyl esters (LOVAZA ) 1 g capsule, Take 1 capsule (1 g total) by mouth 2 (two) times daily., Disp: 60 capsule, Rfl: 0   albuterol  (PROAIR  HFA) 108 (90 Base)  MCG/ACT inhaler, inhale 2 puffs by mouth every 4 to 6 hours if needed for SHORTNESS OF BREATH, Disp: 48 g, Rfl: 1   Albuterol -Budesonide  (AIRSUPRA ) 90-80 MCG/ACT AERO, Inhale 1-2 puffs into the lungs every 4 (four) hours as needed., Disp: 10.7 g, Rfl: 12   b complex vitamins tablet, Take 1 tablet by mouth daily., Disp: , Rfl:    buPROPion  (WELLBUTRIN ) 75 MG tablet, Take 1 tablet (75 mg total) by  mouth daily., Disp: 90 tablet, Rfl: 1   busPIRone  (BUSPAR ) 10 MG tablet, TAKE 1 TO 1 1/2 TABLETS(10 TO 15 MG) BY MOUTH THREE TIMES DAILY, Disp: 135 tablet, Rfl: 1   CREATINE MONOHYDRATE PO, Take by mouth., Disp: , Rfl:    cyclobenzaprine  (FLEXERIL ) 10 MG tablet, Take 0.5-1 tablets (5-10 mg total) by mouth at bedtime., Disp: 90 tablet, Rfl: 0   fluticasone -salmeterol (WIXELA INHUB) 100-50 MCG/ACT AEPB, Inhale 1 puff into the lungs 2 (two) times daily., Disp: 180 each, Rfl: 1   hydrocortisone  (ANUSOL -HC) 25 MG suppository, Place 1 suppository (25 mg total) rectally 2 (two) times daily., Disp: 12 suppository, Rfl: 0   hydrOXYzine  (ATARAX ) 10 MG tablet, Take 0.5-1 tablets (5-10 mg total) by mouth 3 (three) times daily as needed., Disp: 270 tablet, Rfl: 1   Multiple Vitamin (MULTIVITAMIN WITH MINERALS) TABS tablet, Take 1 tablet by mouth daily., Disp: , Rfl:    naproxen  (NAPROSYN ) 500 MG tablet, Take 1 tablet (500 mg total) by mouth 2 (two) times daily with a meal., Disp: 180 tablet, Rfl: 0   nortriptyline  (PAMELOR ) 10 MG capsule, Take 1 capsule (10 mg total) by mouth at bedtime., Disp: 90 capsule, Rfl: 1   tadalafil  (CIALIS ) 5 MG tablet, Take 1 tablet (5 mg total) by mouth daily., Disp: 90 tablet, Rfl: 1   triamcinolone  ointment (KENALOG ) 0.5 %, Apply 1 Application topically 2 (two) times daily., Disp: 30 g, Rfl: 0   vitamin C (ASCORBIC ACID) 500 MG tablet, Take 500 mg by mouth daily., Disp: , Rfl:   Observations/Objective: Patient is well-developed, well-nourished in no acute distress.  Resting comfortably at home.  Head is normocephalic, atraumatic.  No labored breathing.  Speech is clear and coherent with logical content.  Patient is alert and oriented at baseline.    Assessment and Plan: 1. Hypertriglyceridemia (Primary) - omega-3 acid ethyl esters (LOVAZA ) 1 g capsule; Take 1 capsule (1 g total) by mouth 2 (two) times daily.  Dispense: 60 capsule; Refill: 0  - Will prescribe Lovaza  for  hypertriglyceridemia; does not desire statin at this time, wishes to work on Lifestyle and use Lovaza  initially - Patient does admit to poor diet and nighty alcohol consumption (2-3 beers) - Does feel he had pizza the night before labs were drawn but this was a fasting lab (coffee only morning of) - Diet information provided in AVS - Follow up with PCP for any further recommendations  Follow Up Instructions: I discussed the assessment and treatment plan with the patient. The patient was provided an opportunity to ask questions and all were answered. The patient agreed with the plan and demonstrated an understanding of the instructions.  A copy of instructions were sent to the patient via MyChart unless otherwise noted below.    The patient was advised to call back or seek an in-person evaluation if the symptoms worsen or if the condition fails to improve as anticipated.    [name], PA-C

## 2023-12-06 ENCOUNTER — Ambulatory Visit
Admission: RE | Admit: 2023-12-06 | Discharge: 2023-12-06 | Disposition: A | Discharge status: Home / Self Care | Source: Ambulatory Visit | Attending: Family Medicine | Admitting: Family Medicine

## 2023-12-06 DIAGNOSIS — M4726 Other spondylosis with radiculopathy, lumbar region: Secondary | ICD-10-CM | POA: Insufficient documentation

## 2023-12-06 DIAGNOSIS — M5116 Intervertebral disc disorders with radiculopathy, lumbar region: Secondary | ICD-10-CM | POA: Diagnosis not present

## 2023-12-06 DIAGNOSIS — M48061 Spinal stenosis, lumbar region without neurogenic claudication: Secondary | ICD-10-CM | POA: Diagnosis not present

## 2023-12-06 DIAGNOSIS — M4807 Spinal stenosis, lumbosacral region: Secondary | ICD-10-CM | POA: Diagnosis not present

## 2023-12-07 DIAGNOSIS — R293 Abnormal posture: Secondary | ICD-10-CM | POA: Diagnosis not present

## 2023-12-07 DIAGNOSIS — M5459 Other low back pain: Secondary | ICD-10-CM | POA: Diagnosis not present

## 2023-12-08 ENCOUNTER — Ambulatory Visit: Payer: Self-pay | Admitting: Family Medicine

## 2023-12-10 DIAGNOSIS — R293 Abnormal posture: Secondary | ICD-10-CM | POA: Diagnosis not present

## 2023-12-10 DIAGNOSIS — M5459 Other low back pain: Secondary | ICD-10-CM | POA: Diagnosis not present

## 2023-12-14 DIAGNOSIS — M5459 Other low back pain: Secondary | ICD-10-CM | POA: Diagnosis not present

## 2023-12-14 DIAGNOSIS — R293 Abnormal posture: Secondary | ICD-10-CM | POA: Diagnosis not present

## 2023-12-15 ENCOUNTER — Ambulatory Visit: Admitting: Family Medicine

## 2023-12-16 ENCOUNTER — Ambulatory Visit: Payer: Self-pay | Admitting: Family Medicine

## 2023-12-17 DIAGNOSIS — R293 Abnormal posture: Secondary | ICD-10-CM | POA: Diagnosis not present

## 2023-12-17 DIAGNOSIS — M5459 Other low back pain: Secondary | ICD-10-CM | POA: Diagnosis not present

## 2023-12-21 DIAGNOSIS — R293 Abnormal posture: Secondary | ICD-10-CM | POA: Diagnosis not present

## 2023-12-21 DIAGNOSIS — M5459 Other low back pain: Secondary | ICD-10-CM | POA: Diagnosis not present

## 2023-12-24 DIAGNOSIS — R293 Abnormal posture: Secondary | ICD-10-CM | POA: Diagnosis not present

## 2023-12-24 DIAGNOSIS — M5459 Other low back pain: Secondary | ICD-10-CM | POA: Diagnosis not present

## 2024-01-04 ENCOUNTER — Ambulatory Visit (INDEPENDENT_AMBULATORY_CARE_PROVIDER_SITE_OTHER): Admitting: Family Medicine

## 2024-01-04 ENCOUNTER — Encounter: Payer: Self-pay | Admitting: Family Medicine

## 2024-01-04 VITALS — BP 105/73 | HR 80 | Temp 98.0°F | Ht 72.0 in | Wt 205.6 lb

## 2024-01-04 DIAGNOSIS — M4726 Other spondylosis with radiculopathy, lumbar region: Secondary | ICD-10-CM | POA: Diagnosis not present

## 2024-01-04 DIAGNOSIS — E781 Pure hyperglyceridemia: Secondary | ICD-10-CM | POA: Insufficient documentation

## 2024-01-04 DIAGNOSIS — Z23 Encounter for immunization: Secondary | ICD-10-CM | POA: Diagnosis not present

## 2024-01-04 DIAGNOSIS — Z789 Other specified health status: Secondary | ICD-10-CM

## 2024-01-04 NOTE — Progress Notes (Signed)
 BP 105/73   Pulse 80   Temp 98 F (36.7 C) (Oral)   Ht 6' (1.829 m)   Wt 205 lb 9.6 oz (93.3 kg)   SpO2 98%   BMI 27.88 kg/m    Subjective:    Patient ID: Christopher Davenport, male    DOB: 06/04/1975, 48 y.o.   MRN: [id number]  HPI: Christopher Davenport is a 48 y.o. male  Chief Complaint  Patient presents with   Back Pain   Hyperlipidemia    Would like another lipid panel.    BACK PAIN- 90% better with PT Duration: months Mechanism of injury: unknown Location: Right and low back Onset: sudden Severity: mild Quality: numb and tingling Frequency: intermittent Radiation: R leg down to his foot Aggravating factors: happened when he was laying down Alleviating factors: moving around Status: better Treatments attempted: stretching, moving around, PT  Relief with NSAIDs?: No NSAIDs Taken Nighttime pain:  no Paresthesias / decreased sensation:  yes Bowel / bladder incontinence:  no Fevers:  no Dysuria / urinary frequency:  no  HYPERLIPIDEMIA Hyperlipidemia status: excellent compliance Satisfied with current treatment?  yes Side effects:  no Medication compliance: excellent compliance Past cholesterol meds: lovaza  Supplements: fish oil Aspirin:  no The 10-year ASCVD risk score (Arnett DK, et al., 2019) is: 5.2%   Values used to calculate the score:     Age: 68 years     Clincally relevant sex: Male     Is Non-Hispanic African American: No     Diabetic: No     Tobacco smoker: No     Systolic Blood Pressure: 105 mmHg     Is BP treated: No     HDL Cholesterol: 33 mg/dL     Total Cholesterol: 249 mg/dL Chest pain:  no Coronary artery disease:  no   Relevant past medical, surgical, family and social history reviewed and updated as indicated. Interim medical history since our last visit reviewed. Allergies and medications reviewed and updated.  Review of Systems  Constitutional: Negative.   Respiratory: Negative.    Cardiovascular: Negative.   Musculoskeletal:   Positive for back pain and myalgias. Negative for arthralgias, gait problem, joint swelling, neck pain and neck stiffness.  Skin: Negative.   Psychiatric/Behavioral: Negative.      Per HPI unless specifically indicated above     Objective:    BP 105/73   Pulse 80   Temp 98 F (36.7 C) (Oral)   Ht 6' (1.829 m)   Wt 205 lb 9.6 oz (93.3 kg)   SpO2 98%   BMI 27.88 kg/m   Wt Readings from Last 3 Encounters:  01/04/24 205 lb 9.6 oz (93.3 kg)  11/30/23 221 lb 3.2 oz (100.3 kg)  11/03/23 224 lb (101.6 kg)    Physical Exam Vitals and nursing note reviewed.  Constitutional:      General: He is not in acute distress.    Appearance: Normal appearance. He is not ill-appearing, toxic-appearing or diaphoretic.  HENT:     Head: Normocephalic and atraumatic.     Right Ear: External ear normal.     Left Ear: External ear normal.     Nose: Nose normal.     Mouth/Throat:     Mouth: Mucous membranes are moist.     Pharynx: Oropharynx is clear.  Eyes:     General: No scleral icterus.       Right eye: No discharge.        Left eye: No discharge.  Extraocular Movements: Extraocular movements intact.     Conjunctiva/sclera: Conjunctivae normal.     Pupils: Pupils are equal, round, and reactive to light.  Cardiovascular:     Rate and Rhythm: Normal rate and regular rhythm.     Pulses: Normal pulses.     Heart sounds: Normal heart sounds. No murmur heard.    No friction rub. No gallop.  Pulmonary:     Effort: Pulmonary effort is normal. No respiratory distress.     Breath sounds: Normal breath sounds. No stridor. No wheezing, rhonchi or rales.  Chest:     Chest wall: No tenderness.  Musculoskeletal:        General: Normal range of motion.     Cervical back: Normal range of motion and neck supple.  Skin:    General: Skin is warm and dry.     Capillary Refill: Capillary refill takes less than 2 seconds.     Coloration: Skin is not jaundiced or pale.     Findings: No bruising,  erythema, lesion or rash.  Neurological:     General: No focal deficit present.     Mental Status: He is alert and oriented to person, place, and time. Mental status is at baseline.  Psychiatric:        Mood and Affect: Mood normal.        Behavior: Behavior normal.        Thought Content: Thought content normal.        Judgment: Judgment normal.     Results for orders placed or performed in visit on 11/30/23  Comprehensive metabolic panel with GFR   Collection Time: 11/30/23 10:26 AM  Result Value Ref Range   Glucose 96 70 - 99 mg/dL   BUN 17 6 - 24 mg/dL   Creatinine, Ser 8.91 0.76 - 1.27 mg/dL   eGFR 84 >40 fO/fpw/8.26   BUN/Creatinine Ratio 16 9 - 20   Sodium 140 134 - 144 mmol/L   Potassium 4.1 3.5 - 5.2 mmol/L   Chloride 99 96 - 106 mmol/L   CO2 24 20 - 29 mmol/L   Calcium 9.2 8.7 - 10.2 mg/dL   Total Protein 6.6 6.0 - 8.5 g/dL   Albumin 4.2 4.1 - 5.1 g/dL   Globulin, Total 2.4 1.5 - 4.5 g/dL   Bilirubin Total 0.4 0.0 - 1.2 mg/dL   Alkaline Phosphatase 73 44 - 121 IU/L   AST 22 0 - 40 IU/L   ALT 28 0 - 44 IU/L  CBC with Differential/Platelet   Collection Time: 11/30/23 10:26 AM  Result Value Ref Range   WBC 6.1 3.4 - 10.8 x10E3/uL   RBC 4.50 4.14 - 5.80 x10E6/uL   Hemoglobin 14.6 13.0 - 17.7 g/dL   Hematocrit 56.1 62.4 - 51.0 %   MCV 97 79 - 97 fL   MCH 32.4 26.6 - 33.0 pg   MCHC 33.3 31.5 - 35.7 g/dL   RDW 87.1 88.3 - 84.5 %   Platelets 208 150 - 450 x10E3/uL   Neutrophils 47 Not Estab. %   Lymphs 32 Not Estab. %   Monocytes 8 Not Estab. %   Eos 12 Not Estab. %   Basos 1 Not Estab. %   Neutrophils Absolute 2.9 1.4 - 7.0 x10E3/uL   Lymphocytes Absolute 2.0 0.7 - 3.1 x10E3/uL   Monocytes Absolute 0.5 0.1 - 0.9 x10E3/uL   EOS (ABSOLUTE) 0.7 (H) 0.0 - 0.4 x10E3/uL   Basophils Absolute 0.1 0.0 - 0.2 x10E3/uL  Immature Granulocytes 0 Not Estab. %   Immature Grans (Abs) 0.0 0.0 - 0.1 x10E3/uL  Lipid Panel w/o Chol/HDL Ratio   Collection Time: 11/30/23 10:26  AM  Result Value Ref Range   Cholesterol, Total 249 (H) 100 - 199 mg/dL   Triglycerides 248 (HH) 0 - 149 mg/dL   HDL 33 (L) >60 mg/dL   VLDL Cholesterol Cal 125 (H) 5 - 40 mg/dL   LDL Chol Calc (NIH) 91 0 - 99 mg/dL  PSA   Collection Time: 11/30/23 10:26 AM  Result Value Ref Range   Prostate Specific Ag, Serum 0.5 0.0 - 4.0 ng/mL  TSH   Collection Time: 11/30/23 10:26 AM  Result Value Ref Range   TSH 4.940 (H) 0.450 - 4.500 uIU/mL  Hepatitis B surface antibody,quantitative   Collection Time: 11/30/23 10:26 AM  Result Value Ref Range   Hepatitis B Surf Ab Quant <3.5 (L) Immunity>10 mIU/mL  VITAMIN D  25 Hydroxy (Vit-D Deficiency, Fractures)   Collection Time: 11/30/23 10:26 AM  Result Value Ref Range   Vit D, 25-Hydroxy 23.2 (L) 30.0 - 100.0 ng/mL      Assessment & Plan:   Problem List Items Addressed This Visit       Nervous and Auditory   Osteoarthritis of spine with radiculopathy, lumbar region   Doing significantly better with PT! Continue PT. Call with any concerns. Declines referral to PM&R today.       Relevant Medications   ibuprofen (ADVIL) 600 MG tablet   acetaminophen-codeine (TYLENOL #3) 300-30 MG tablet     Other   Hypertriglyceridemia - Primary   Tolerating the lovaza  well. Will recheck labs today. Treat as needed.       Relevant Orders   Lipid Panel w/o Chol/HDL Ratio   Comprehensive metabolic panel with GFR   Other Visit Diagnoses       Needs flu shot       Flu shot given today.   Relevant Orders   Flu vaccine trivalent PF, 6mos and older(Flulaval,Afluria,Fluarix,Fluzone)     Need for shingles vaccine       Will come back for shingrix.   Relevant Orders   Zoster Recombinant (Shingrix )     Not immune to hepatitis B virus       Will come back for hep B shot.   Relevant Orders   Hepatitis B vaccine adult IM        Follow up plan: Return in about 5 months (around 06/03/2024).

## 2024-01-04 NOTE — Assessment & Plan Note (Signed)
 Tolerating the lovaza  well. Will recheck labs today. Treat as needed.

## 2024-01-04 NOTE — Assessment & Plan Note (Signed)
 Doing significantly better with PT! Continue PT. Call with any concerns. Declines referral to PM&R today.

## 2024-01-05 DIAGNOSIS — M5459 Other low back pain: Secondary | ICD-10-CM | POA: Diagnosis not present

## 2024-01-05 DIAGNOSIS — R293 Abnormal posture: Secondary | ICD-10-CM | POA: Diagnosis not present

## 2024-01-05 LAB — COMPREHENSIVE METABOLIC PANEL WITH GFR
ALT: 28 IU/L (ref 0–44)
AST: 22 IU/L (ref 0–40)
Albumin: 4.6 g/dL (ref 4.1–5.1)
Alkaline Phosphatase: 66 IU/L (ref 47–123)
BUN/Creatinine Ratio: 20 (ref 9–20)
BUN: 22 mg/dL (ref 6–24)
Bilirubin Total: 1 mg/dL (ref 0.0–1.2)
CO2: 24 mmol/L (ref 20–29)
Calcium: 9.6 mg/dL (ref 8.7–10.2)
Chloride: 102 mmol/L (ref 96–106)
Creatinine, Ser: 1.11 mg/dL (ref 0.76–1.27)
Globulin, Total: 2.4 g/dL (ref 1.5–4.5)
Glucose: 93 mg/dL (ref 70–99)
Potassium: 4 mmol/L (ref 3.5–5.2)
Sodium: 142 mmol/L (ref 134–144)
Total Protein: 7 g/dL (ref 6.0–8.5)
eGFR: 81 mL/min/1.73 (ref >59)

## 2024-01-05 LAB — LIPID PANEL W/O CHOL/HDL RATIO
Cholesterol, Total: 180 mg/dL (ref 100–199)
HDL: 47 mg/dL (ref >39)
LDL Chol Calc (NIH): 115 mg/dL — ABNORMAL HIGH (ref 0–99)
Triglycerides: 101 mg/dL (ref 0–149)
VLDL Cholesterol Cal: 18 mg/dL (ref 5–40)

## 2024-01-07 ENCOUNTER — Ambulatory Visit: Payer: Self-pay | Admitting: Family Medicine

## 2024-01-14 DIAGNOSIS — R293 Abnormal posture: Secondary | ICD-10-CM | POA: Diagnosis not present

## 2024-01-14 DIAGNOSIS — M5459 Other low back pain: Secondary | ICD-10-CM | POA: Diagnosis not present

## 2024-01-18 DIAGNOSIS — M5459 Other low back pain: Secondary | ICD-10-CM | POA: Diagnosis not present

## 2024-01-18 DIAGNOSIS — R293 Abnormal posture: Secondary | ICD-10-CM | POA: Diagnosis not present

## 2024-01-19 DIAGNOSIS — M795 Residual foreign body in soft tissue: Secondary | ICD-10-CM | POA: Diagnosis not present

## 2024-01-19 DIAGNOSIS — K034 Hypercementosis: Secondary | ICD-10-CM | POA: Diagnosis not present

## 2024-01-19 DIAGNOSIS — M272 Inflammatory conditions of jaws: Secondary | ICD-10-CM | POA: Diagnosis not present

## 2024-01-21 DIAGNOSIS — M5459 Other low back pain: Secondary | ICD-10-CM | POA: Diagnosis not present

## 2024-01-21 DIAGNOSIS — R293 Abnormal posture: Secondary | ICD-10-CM | POA: Diagnosis not present

## 2024-04-20 ENCOUNTER — Encounter: Payer: Self-pay | Admitting: Family Medicine

## 2024-04-20 ENCOUNTER — Ambulatory Visit: Admitting: Family Medicine

## 2024-04-20 VITALS — BP 119/83 | HR 75 | Temp 97.4°F | Resp 16 | Ht 72.01 in | Wt 218.4 lb

## 2024-04-20 DIAGNOSIS — R5382 Chronic fatigue, unspecified: Secondary | ICD-10-CM

## 2024-04-20 DIAGNOSIS — K047 Periapical abscess without sinus: Secondary | ICD-10-CM

## 2024-04-20 DIAGNOSIS — F339 Major depressive disorder, recurrent, unspecified: Secondary | ICD-10-CM

## 2024-04-20 LAB — BAYER DCA HB A1C WAIVED: HB A1C (BAYER DCA - WAIVED): 5 % (ref 4.8–5.6)

## 2024-04-20 MED ORDER — DULOXETINE HCL 20 MG PO CPEP: ORAL_CAPSULE | ORAL | 2 refills | Status: AC

## 2024-04-20 NOTE — Progress Notes (Signed)
 "  BP 119/83 (BP Location: Left Arm, Patient Position: Sitting, Cuff Size: Normal)   Pulse 75   Temp (!) 97.4 F (36.3 C) (Oral)   Resp 16   Ht 6' 0.01 (1.829 m)   Wt 218 lb 6.4 oz (99.1 kg)   SpO2 99%   BMI 29.61 kg/m    Subjective:    Patient ID: Christopher Davenport, male    DOB: [birth year], 49 y.o.   MRN: [id number]  HPI: Christopher Davenport is a 48 y.o. male  Chief Complaint  Patient presents with   Oral Pain    Had dental surgery back in October, still having pain from it was on antibiotics for it and then infection has come back.   Fatigue    Fatigue just feels like crap, run down and rough, feels spacey and has been stressed due to being laid off from job.   Medication Problem    Buspirone  has been taking that everyday since he lost his job.   Back Pain    Has not bee exercising like he should due to infection in tooth and job loss   DENTAL PAIN- had surgery in October and has been on antibiotics through dentistry Duration: 4 months Involved teeth: right and upper Dentist evaluation: yes Mechanism of injury:  unknown Onset: gradual Severity: mild Quality: aching Frequency: constant Radiation: none Aggravating factors: nothing Alleviating factors: popping the pimple Status: better Treatments attempted: antibiotics Relief with NSAIDs?: No NSAIDs Taken Fevers: no Swelling: yes Redness: no Paresthesias / decreased sensation: no Sinus pressure: no  Lost his job in October and ha been under a lot of stress. He notes that he has been feeling spacey and weird. He has been taking the buspar  daily and he is not sure if that's been making him feel off. He has been having some back pain- seeing chiropractor and that has been helping the back. He has not been working out due to the holidays, the back and the tooth and that is not making him feel normal.   ANXIETY/DEPRESSION- he is feeling stressed out, agitated, demoralized, but also spacey and numb. He stopped his  nortriptyline  and his Wellbutrin . He notes that the nortritpyline made him groggy and confused, Wellbutrin  made him angry, has tried sertraline  and celexa  which both made him feel off, had side effects from prozac , but unsure what.  Duration: chronic Status:uncontrolled Anxious mood: yes  Excessive worrying: yes Irritability: yes  Sweating: yes Nausea: no Palpitations:no Hyperventilation: no Panic attacks: no Agoraphobia: no  Obscessions/compulsions: no Depressed mood: yes    04/20/2024    2:14 PM 01/04/2024   10:00 AM 11/30/2023    9:35 AM 11/03/2023    4:01 PM 10/20/2023    2:57 PM  Depression screen PHQ 2/9  Decreased Interest 1 1 1 1 1   Down, Depressed, Hopeless 2 1 1 1 2   PHQ - 2 Score 3 2 2 2 3   Altered sleeping 0 1 1 2 3   Tired, decreased energy 1 1 1 1 3   Change in appetite 1 0 0 0 1  Feeling bad or failure about yourself  1 0 1 1 1   Trouble concentrating 1 1 1 1 3   Moving slowly or fidgety/restless 0 0 1 1 2   Suicidal thoughts 0 0 0 0 0  PHQ-9 Score 7 5  7  8  16    Difficult doing work/chores  Somewhat difficult Somewhat difficult Somewhat difficult Very difficult     Data saved with a  previous flowsheet row definition   Anhedonia: no Weight changes: no Insomnia: no   Hypersomnia: yes Fatigue/loss of energy: yes Feelings of worthlessness: yes Feelings of guilt: yes Impaired concentration/indecisiveness: yes Suicidal ideations: no  Crying spells: no Recent Stressors/Life Changes: no   Relationship problems: no   Family stress: no     Financial stress: yes    Job stress: yes    Recent death/loss: no   Relevant past medical, surgical, family and social history reviewed and updated as indicated. Interim medical history since our last visit reviewed. Allergies and medications reviewed and updated.  Review of Systems  Constitutional:  Positive for fatigue. Negative for activity change, appetite change, chills, diaphoresis, fever and unexpected weight change.   HENT: Negative.    Respiratory: Negative.    Cardiovascular: Negative.   Musculoskeletal: Negative.   Neurological: Negative.   Psychiatric/Behavioral:  Positive for dysphoric mood. Negative for agitation, behavioral problems, confusion, decreased concentration, hallucinations, self-injury, sleep disturbance and suicidal ideas. The patient is nervous/anxious. The patient is not hyperactive.     Per HPI unless specifically indicated above     Objective:    BP 119/83 (BP Location: Left Arm, Patient Position: Sitting, Cuff Size: Normal)   Pulse 75   Temp (!) 97.4 F (36.3 C) (Oral)   Resp 16   Ht 6' 0.01 (1.829 m)   Wt 218 lb 6.4 oz (99.1 kg)   SpO2 99%   BMI 29.61 kg/m   Wt Readings from Last 3 Encounters:  04/20/24 218 lb 6.4 oz (99.1 kg)  01/04/24 205 lb 9.6 oz (93.3 kg)  11/30/23 221 lb 3.2 oz (100.3 kg)    Physical Exam Vitals and nursing note reviewed.  Constitutional:      General: He is not in acute distress.    Appearance: Normal appearance. He is obese. He is not ill-appearing, toxic-appearing or diaphoretic.  HENT:     Head: Normocephalic and atraumatic.     Right Ear: External ear normal.     Left Ear: External ear normal.     Nose: Nose normal.     Mouth/Throat:     Mouth: Mucous membranes are moist.     Pharynx: Oropharynx is clear.  Eyes:     General: No scleral icterus.       Right eye: No discharge.        Left eye: No discharge.     Extraocular Movements: Extraocular movements intact.     Conjunctiva/sclera: Conjunctivae normal.     Pupils: Pupils are equal, round, and reactive to light.  Cardiovascular:     Rate and Rhythm: Normal rate and regular rhythm.     Pulses: Normal pulses.     Heart sounds: Normal heart sounds. No murmur heard.    No friction rub. No gallop.  Pulmonary:     Effort: Pulmonary effort is normal. No respiratory distress.     Breath sounds: Normal breath sounds. No stridor. No wheezing, rhonchi or rales.  Chest:      Chest wall: No tenderness.  Musculoskeletal:        General: Normal range of motion.     Cervical back: Normal range of motion and neck supple.  Skin:    General: Skin is warm and dry.     Capillary Refill: Capillary refill takes less than 2 seconds.     Coloration: Skin is not jaundiced or pale.     Findings: No bruising, erythema, lesion or rash.  Neurological:  General: No focal deficit present.     Mental Status: He is alert and oriented to person, place, and time. Mental status is at baseline.  Psychiatric:        Mood and Affect: Mood normal.        Behavior: Behavior normal.        Thought Content: Thought content normal.        Judgment: Judgment normal.     Results for orders placed or performed in visit on 01/04/24  Lipid Panel w/o Chol/HDL Ratio   Collection Time: 01/04/24 11:08 AM  Result Value Ref Range   Cholesterol, Total 180 100 - 199 mg/dL   Triglycerides 898 0 - 149 mg/dL   HDL 47 >60 mg/dL   VLDL Cholesterol Cal 18 5 - 40 mg/dL   LDL Chol Calc (NIH) 884 (H) 0 - 99 mg/dL  Comprehensive metabolic panel with GFR   Collection Time: 01/04/24 11:08 AM  Result Value Ref Range   Glucose 93 70 - 99 mg/dL   BUN 22 6 - 24 mg/dL   Creatinine, Ser 8.88 0.76 - 1.27 mg/dL   eGFR 81 >40 fO/fpw/8.26   BUN/Creatinine Ratio 20 9 - 20   Sodium 142 134 - 144 mmol/L   Potassium 4.0 3.5 - 5.2 mmol/L   Chloride 102 96 - 106 mmol/L   CO2 24 20 - 29 mmol/L   Calcium 9.6 8.7 - 10.2 mg/dL   Total Protein 7.0 6.0 - 8.5 g/dL   Albumin 4.6 4.1 - 5.1 g/dL   Globulin, Total 2.4 1.5 - 4.5 g/dL   Bilirubin Total 1.0 0.0 - 1.2 mg/dL   Alkaline Phosphatase 66 47 - 123 IU/L   AST 22 0 - 40 IU/L   ALT 28 0 - 44 IU/L      Assessment & Plan:   Problem List Items Addressed This Visit       Other   Depression, recurrent - Primary   Not doing well. Has stopped all his preventative medications. Will start cymbalta  and recheck in about a month. Call with any concerns. Continue to  monitor.       Relevant Medications   DULoxetine  (CYMBALTA ) 20 MG capsule   Other Visit Diagnoses       Chronic fatigue       Likely mood related. Will check labs. Treat as needed. Call with any concerns.   Relevant Orders   CBC with Differential/Platelet   Comprehensive metabolic panel with GFR   VITAMIN D  25 Hydroxy (Vit-D Deficiency, Fractures)   TSH   B12   Bayer DCA Hb A1c Waived     Chronic dental infection       Continue to follow with dentistry. Call with any concerns. Continue to monitor.        Follow up plan: Return 3-4 weeks.      "

## 2024-04-20 NOTE — Assessment & Plan Note (Signed)
 Not doing well. Has stopped all his preventative medications. Will start cymbalta  and recheck in about a month. Call with any concerns. Continue to monitor.

## 2024-04-21 ENCOUNTER — Ambulatory Visit: Payer: Self-pay | Admitting: Family Medicine

## 2024-04-21 LAB — COMPREHENSIVE METABOLIC PANEL WITH GFR
ALT: 34 [IU]/L (ref 0–44)
AST: 26 [IU]/L (ref 0–40)
Albumin: 4.5 g/dL (ref 3.8–4.9)
Alkaline Phosphatase: 80 [IU]/L (ref 47–123)
BUN/Creatinine Ratio: 13 (ref 9–20)
BUN: 15 mg/dL (ref 6–24)
Bilirubin Total: 0.5 mg/dL (ref 0.0–1.2)
CO2: 23 mmol/L (ref 20–29)
Calcium: 9.8 mg/dL (ref 8.7–10.2)
Chloride: 103 mmol/L (ref 96–106)
Creatinine, Ser: 1.14 mg/dL (ref 0.76–1.27)
Globulin, Total: 2.6 g/dL (ref 1.5–4.5)
Glucose: 99 mg/dL (ref 70–99)
Potassium: 4.2 mmol/L (ref 3.5–5.2)
Sodium: 143 mmol/L (ref 134–144)
Total Protein: 7.1 g/dL (ref 6.0–8.5)
eGFR: 78 mL/min/{1.73_m2}

## 2024-04-21 LAB — CBC WITH DIFFERENTIAL/PLATELET
Basophils Absolute: 0 10*3/uL (ref 0.0–0.2)
Basos: 0 %
EOS (ABSOLUTE): 0.4 10*3/uL (ref 0.0–0.4)
Eos: 5 %
Hematocrit: 43.2 % (ref 37.5–51.0)
Hemoglobin: 14.8 g/dL (ref 13.0–17.7)
Immature Grans (Abs): 0 10*3/uL (ref 0.0–0.1)
Immature Granulocytes: 0 %
Lymphocytes Absolute: 2 10*3/uL (ref 0.7–3.1)
Lymphs: 26 %
MCH: 32.7 pg (ref 26.6–33.0)
MCHC: 34.3 g/dL (ref 31.5–35.7)
MCV: 95 fL (ref 79–97)
Monocytes Absolute: 0.4 10*3/uL (ref 0.1–0.9)
Monocytes: 5 %
Neutrophils Absolute: 4.8 10*3/uL (ref 1.4–7.0)
Neutrophils: 64 %
Platelets: 258 10*3/uL (ref 150–450)
RBC: 4.53 x10E6/uL (ref 4.14–5.80)
RDW: 13.3 % (ref 11.6–15.4)
WBC: 7.6 10*3/uL (ref 3.4–10.8)

## 2024-04-21 LAB — TSH: TSH: 2.4 u[IU]/mL (ref 0.450–4.500)

## 2024-04-21 LAB — VITAMIN D 25 HYDROXY (VIT D DEFICIENCY, FRACTURES): Vit D, 25-Hydroxy: 33.3 ng/mL (ref 30.0–100.0)

## 2024-04-21 LAB — VITAMIN B12: Vitamin B-12: 603 pg/mL (ref 232–1245)

## 2024-05-17 ENCOUNTER — Ambulatory Visit: Admitting: Family Medicine

## 2024-06-05 ENCOUNTER — Ambulatory Visit: Admitting: Family Medicine
